# Patient Record
Sex: Male | Born: 1945 | Race: White | Hispanic: No | Marital: Married | Smoking: Former smoker
Health system: Southern US, Community
[De-identification: ages and names within clinical notes are randomized; demographics above are authoritative.]

## PROBLEM LIST (undated history)

## (undated) DIAGNOSIS — I119 Hypertensive heart disease without heart failure: Secondary | ICD-10-CM

## (undated) DIAGNOSIS — I10 Essential (primary) hypertension: Secondary | ICD-10-CM

## (undated) DIAGNOSIS — I2699 Other pulmonary embolism without acute cor pulmonale: Secondary | ICD-10-CM

## (undated) DIAGNOSIS — T8859XA Other complications of anesthesia, initial encounter: Secondary | ICD-10-CM

## (undated) DIAGNOSIS — I4891 Unspecified atrial fibrillation: Secondary | ICD-10-CM

## (undated) DIAGNOSIS — I499 Cardiac arrhythmia, unspecified: Secondary | ICD-10-CM

## (undated) DIAGNOSIS — M419 Scoliosis, unspecified: Secondary | ICD-10-CM

## (undated) DIAGNOSIS — R7303 Prediabetes: Secondary | ICD-10-CM

## (undated) DIAGNOSIS — IMO0002 Reserved for concepts with insufficient information to code with codable children: Secondary | ICD-10-CM

## (undated) DIAGNOSIS — R0609 Other forms of dyspnea: Secondary | ICD-10-CM

## (undated) DIAGNOSIS — I739 Peripheral vascular disease, unspecified: Secondary | ICD-10-CM

## (undated) DIAGNOSIS — E7800 Pure hypercholesterolemia, unspecified: Secondary | ICD-10-CM

## (undated) DIAGNOSIS — K219 Gastro-esophageal reflux disease without esophagitis: Secondary | ICD-10-CM

## (undated) DIAGNOSIS — M1990 Unspecified osteoarthritis, unspecified site: Secondary | ICD-10-CM

## (undated) DIAGNOSIS — R51 Headache: Secondary | ICD-10-CM

## (undated) DIAGNOSIS — G709 Myoneural disorder, unspecified: Secondary | ICD-10-CM

## (undated) DIAGNOSIS — H9319 Tinnitus, unspecified ear: Secondary | ICD-10-CM

## (undated) DIAGNOSIS — I82409 Acute embolism and thrombosis of unspecified deep veins of unspecified lower extremity: Secondary | ICD-10-CM

## (undated) DIAGNOSIS — I2510 Atherosclerotic heart disease of native coronary artery without angina pectoris: Secondary | ICD-10-CM

## (undated) DIAGNOSIS — F419 Anxiety disorder, unspecified: Secondary | ICD-10-CM

## (undated) DIAGNOSIS — G4730 Sleep apnea, unspecified: Secondary | ICD-10-CM

## (undated) DIAGNOSIS — F32A Depression, unspecified: Secondary | ICD-10-CM

## (undated) DIAGNOSIS — D682 Hereditary deficiency of other clotting factors: Secondary | ICD-10-CM

## (undated) DIAGNOSIS — R351 Nocturia: Secondary | ICD-10-CM

## (undated) DIAGNOSIS — R519 Headache, unspecified: Secondary | ICD-10-CM

## (undated) DIAGNOSIS — R0600 Dyspnea, unspecified: Secondary | ICD-10-CM

## (undated) DIAGNOSIS — T4145XA Adverse effect of unspecified anesthetic, initial encounter: Secondary | ICD-10-CM

## (undated) HISTORY — PX: TONSILLECTOMY: SUR1361

## (undated) HISTORY — DX: Hypertensive heart disease without heart failure: I11.9

## (undated) HISTORY — DX: Other forms of dyspnea: R06.09

## (undated) HISTORY — DX: Atherosclerotic heart disease of native coronary artery without angina pectoris: I25.10

## (undated) HISTORY — DX: Dyspnea, unspecified: R06.00

---

## 1972-03-21 HISTORY — PX: APPENDECTOMY: SHX54

## 2003-02-21 ENCOUNTER — Emergency Department (HOSPITAL_COMMUNITY): Admission: AD | Admit: 2003-02-21 | Discharge: 2003-02-21 | Payer: Self-pay | Admitting: Internal Medicine

## 2003-02-24 ENCOUNTER — Encounter (HOSPITAL_COMMUNITY): Admission: RE | Admit: 2003-02-24 | Discharge: 2003-05-25 | Payer: Self-pay | Admitting: Internal Medicine

## 2003-11-25 ENCOUNTER — Observation Stay (HOSPITAL_COMMUNITY): Admission: RE | Admit: 2003-11-25 | Discharge: 2003-11-26 | Payer: Self-pay | Admitting: Orthopedic Surgery

## 2010-03-21 HISTORY — PX: HEMATOMA EVACUATION: SHX5118

## 2010-03-21 HISTORY — PX: CARDIAC CATHETERIZATION: SHX172

## 2010-11-02 ENCOUNTER — Ambulatory Visit (HOSPITAL_COMMUNITY): Payer: Managed Care, Other (non HMO)

## 2010-11-02 ENCOUNTER — Ambulatory Visit (HOSPITAL_COMMUNITY)
Admission: RE | Admit: 2010-11-02 | Discharge: 2010-11-05 | Disposition: A | Discharge status: Home / Self Care | Payer: Managed Care, Other (non HMO) | Source: Ambulatory Visit | Attending: Cardiovascular Disease | Admitting: Cardiovascular Disease

## 2010-11-02 DIAGNOSIS — Z01812 Encounter for preprocedural laboratory examination: Secondary | ICD-10-CM | POA: Insufficient documentation

## 2010-11-02 DIAGNOSIS — R9439 Abnormal result of other cardiovascular function study: Secondary | ICD-10-CM | POA: Insufficient documentation

## 2010-11-02 DIAGNOSIS — I2510 Atherosclerotic heart disease of native coronary artery without angina pectoris: Secondary | ICD-10-CM | POA: Insufficient documentation

## 2010-11-02 DIAGNOSIS — Z01818 Encounter for other preprocedural examination: Secondary | ICD-10-CM | POA: Insufficient documentation

## 2010-11-02 DIAGNOSIS — Z01810 Encounter for preprocedural cardiovascular examination: Secondary | ICD-10-CM | POA: Insufficient documentation

## 2010-11-02 LAB — BASIC METABOLIC PANEL
CO2: 28 mEq/L (ref 19–32)
Creatinine, Ser: 0.86 mg/dL (ref 0.50–1.35)
GFR calc non Af Amer: >60 mL/min (ref >60)
Glucose, Bld: 98 mg/dL (ref 70–99)
Sodium: 141 mEq/L (ref 135–145)

## 2010-11-02 LAB — CBC
MCH: 32.2 pg (ref 26.0–34.0)
MCV: 92.7 fL (ref 78.0–100.0)
Platelets: 238 10*3/uL (ref 150–400)

## 2010-11-02 LAB — PROTIME-INR: Prothrombin Time: 12.4 seconds (ref 11.6–15.2)

## 2010-11-03 LAB — CBC
HCT: 41.8 % (ref 39.0–52.0)
Hemoglobin: 14.4 g/dL (ref 13.0–17.0)
MCHC: 34.4 g/dL (ref 30.0–36.0)
Platelets: 185 10*3/uL (ref 150–400)
RBC: 4.47 MIL/uL (ref 4.22–5.81)
WBC: 5.6 10*3/uL (ref 4.0–10.5)

## 2010-11-03 LAB — BASIC METABOLIC PANEL
BUN: 15 mg/dL (ref 6–23)
Creatinine, Ser: 0.83 mg/dL (ref 0.50–1.35)
GFR calc Af Amer: >60 mL/min (ref >60)
GFR calc non Af Amer: >60 mL/min (ref >60)
Potassium: 3.8 mEq/L (ref 3.5–5.1)
Sodium: 141 mEq/L (ref 135–145)

## 2010-11-03 LAB — CARDIAC PANEL(CRET KIN+CKTOT+MB+TROPI)
CK, MB: 6.4 ng/mL (ref 0.3–4.0)
Relative Index: INVALID (ref 0.0–2.5)
Total CK: 93 U/L (ref 7–232)
Troponin I: 0.55 ng/mL (ref <0.30)

## 2010-11-03 LAB — POCT ACTIVATED CLOTTING TIME: Activated Clotting Time: 325 seconds

## 2010-11-04 LAB — CARDIAC PANEL(CRET KIN+CKTOT+MB+TROPI)
Total CK: 91 U/L (ref 7–232)
Total CK: 76 U/L (ref 7–232)
Troponin I: 0.67 ng/mL (ref <0.30)

## 2010-11-04 LAB — BASIC METABOLIC PANEL
BUN: 10 mg/dL (ref 6–23)
Calcium: 9.2 mg/dL (ref 8.4–10.5)
GFR calc Af Amer: >60 mL/min (ref >60)
Sodium: 140 mEq/L (ref 135–145)

## 2010-11-04 LAB — CK TOTAL AND CKMB (NOT AT ARMC)
CK, MB: 7.2 ng/mL (ref 0.3–4.0)
Total CK: 93 U/L (ref 7–232)

## 2010-11-04 LAB — CBC
HCT: 41.8 % (ref 39.0–52.0)
Hemoglobin: 13.9 g/dL (ref 13.0–17.0)
MCH: 31.2 pg (ref 26.0–34.0)
MCHC: 33.3 g/dL (ref 30.0–36.0)

## 2010-11-05 LAB — CARDIAC PANEL(CRET KIN+CKTOT+MB+TROPI)
CK, MB: 3.8 ng/mL (ref 0.3–4.0)
Troponin I: <0.3 ng/mL (ref <0.30)

## 2010-11-05 NOTE — Cardiovascular Report (Signed)
NAME:  Nathan Rodriguez, Nathan Rodriguez.:  [id number]  MEDICAL RECORD NO.:  [id number]  LOCATION:  6526                         FACILITY:  [facility]  PHYSICIAN:  Nanetta Batty, M.D.   DATE OF BIRTH:  03-17-1946  DATE OF PROCEDURE: DATE OF DISCHARGE:                           CARDIAC CATHETERIZATION   Nathan Rodriguez is a very pleasant 65 year old thin-appearing Caucasian male patient of Dr. Nelva Bush referred for cardiac catheterization because of symptoms and positive Myoview.  The patient has a possibility of risk factors.  DESCRIPTION OF PROCEDURE:  The patient was brought to the second floor [place] cardiac cath lab in the postabsorptive state.  He was premedicated with p.o. Valium.  His right groin was prepped and shaved in the usual sterile fashion.  A 1% Xylocaine was used for local anesthesia.  A 5-French sheath was inserted into the right femoral artery using standard Seldinger technique.  A 5-French right-left Judkins diagnostic catheters as well 5-French pigtail catheter were used for selective coronary cholangiography, left ventriculography, subselective left internal mammary artery angiography and distal abdominal aortography.  Visipaque dye was used for the entirety of the case.  Retrograde aorta, left ventricular and pullback pressures were recorded.  HEMODYNAMIC RESULTS: 1. Aortic systolic pressure 129, diastolic pressure 81. 2. Left ventricle systolic pressure 130, end-diastolic pressure 11.  ANGIOGRAPHIC RESULTS: 1. There was moderate fluoroscopic calcium in the proximal portion and     mid portion of the LAD. 2. Left main; normal. 3. LAD; LAD had 60% apple core lesion at the junction of the proximal     mid third just prior to the takeoff of a moderate-sized diagonal     branch which itself had segmental 50 or 60% stenosis.  Beyond this,     there was a long 50%-60% stenosis, terminating in a focal apple     core 90% stenosis in the mid-LAD.  The entire  segment was     fluoroscopically calcified. 4. Left circumflex; free of significant disease. 5. Right coronary artery; dominant and free of significant disease. 6. Left ventriculography; RAO left ventriculogram was performed using     25 mL of Visipaque dye at 12 mL per second.  The overall LVEF was     estimated at approximately 50-55% without significant focal wall     motion abnormalities. 7. Left internal mammary artery; this vessel was subselectively     visualized and was widely patent.  It was suitable for use during     coronary artery bypass grafting if required. 8. Distal abdominal aortography; distal abdominal aortogram was     performed using 20 mL of Visipaque dye at 20 mL per second x2.  The     renal arteries were widely patent.  Infrarenal abdominal aorta and     iliac bifurcation appeared free of significant atherosclerotic     changes.  IMPRESSION:  Nathan Rodriguez has a high-grade segmental proximal mid LAD disease straddling a moderate-sized diagonal.  I believe the best therapy would be high-speed rotational atherectomy followed by PCI and stenting using drug-eluting stent.  He has no contraindications to this nor does he have contraindication to Effient which I will load him with. I will  discussed this with Dr. Daphene Jaeger who agrees to perform the procedure tomorrow.  Sheath was removed and pressures were held over the groin, achieved hemostasis.  The patient left lab in stable condition.  He already received aspirin, he will receive 60 mg of Effient and will be put on unit 6500 until his procedure tomorrow.  Dr.  Elwin Mocha, his primary cardiologist was notified of these results as well.     Nanetta Batty, M.D.     JB/MEDQ  D:  11/02/2010  T:  11/02/2010  Job:  [id number]  cc:   [facility] Nicki Guadalajara, M.D. Elwin Mocha, MD  Electronically Signed by Nanetta Batty M.D. on 11/05/2010 03:37:58 PM

## 2010-11-05 NOTE — H&P (Signed)
NAME:  Nathan Rodriguez, Nathan Rodriguez NO.:  [id number]  MEDICAL RECORD NO.:  [id number]  LOCATION:  [facility]                         FACILITY:  [facility]  PHYSICIAN:  Nanetta Batty, M.D.   DATE OF BIRTH:  January 16, 1946  DATE OF ADMISSION:  11/02/2010 DATE OF DISCHARGE:                             HISTORY & PHYSICAL   CHIEF COMPLAINT:  Chest pain with radiation to right jaw and positive nuclear stress test.  HISTORY OF PRESENT ILLNESS:  A 65 year old white male who presents to [facility] for elective cardiac catheterization secondary to chest pain that was worked up through [facility].  Chest pain occurred in June of this year.  It was sudden.  It occurred for about approximately 10 minutes in the left chest and substernal area with radiation to the right jaw.  No associated dizziness, diaphoresis, dyspnea, nausea, or palpitations.  It occurred when the patient had first awaken, moderate discomfort, and described as a pressure sensation.  The patient has a right bundle branch block on his EKG.  He underwent a stress Myoview which was abnormal and that the EF was 38%.  He has severe hypokinesis to akinesis of the basal anterior basal, anterior septal, basal inferior septal, basal inferior lateral, anterior and apical myocardial walls. He had mainly inferior akinesis.  This was followed by 2-D echo.  EF was 65% without regional wall motion abnormalities.  He had mild LVH and mild diastolic dysfunction.  RV was moderately enlarged with preserved function.  No obvious RVH.  The aortic root appeared to be upper limits of normal but did expand to 3.9 cm in the ascending aorta.  Aortic valve was mildly sclerotic, no stenosis.  He also had mild MR, mild to trace TR.  Carotid Dopplers did not reveal any high-grade stenosis.  Due to the conflicting inflammation, the patient was scheduled for elective cardiac catheterization today with Dr. Allyson Sabal.  PAST MEDICAL HISTORY:  History of BPH,  vocal cord paralysis, sleep apnea, acid reflux.  Carotid bruit that shows mild stenosis and history of Barrett's esophagus.  FAMILY HISTORY:  Positive for diabetes and heart disease as well as Leiden factor V deficiency.  PAST SURGICAL HISTORY:  Appendectomy, tonsillectomy, prostate surgery.  SOCIAL HISTORY:  No alcohol or tobacco use.  He stopped both remotely.  REVIEW OF SYSTEMS:  GENERAL:  No syncope.  GI:  No complaints of reflux currently, no melena.  GU: No hematuria, dysuria.  CARDIOVASCULAR:  As described.  PULMONARY:  Negative.  MUSCULOSKELETAL:  Stable.  No acute process.  OUTPATIENT MEDICATIONS: 1. Prilosec 20 mg 1 every day as needed. 2. Ibuprofen 200 mg 1 every 8 hours as needed.  ALLERGIES:  No known allergies.  PHYSICAL EXAMINATION:  VITAL SIGNS:  Blood pressure 143/90, pulse 66, respiratory rate 18, temperature 96.8, oxygen saturation 100%. GENERAL:  Alert and oriented white male in no acute distress, pleasant affect. SKIN:  Warm and dry.  Brisk capillary refill. HEENT:  Normocephalic.  Glasses in place. NECK:  Supple.  No JVD, soft carotid bruit. LUNGS:  Clear without rales, rhonchi, or wheezes. HEART:  Sounds S1 and S2, regular rate and rhythm with a 2/6 systolic murmur. ABDOMEN:  Soft, nontender,  positive bowel sounds.  I do not palpate liver, spleen, or masses. EXTREMITIES:  Lower Extremities:  1+ pedal edema.  No lower extremity edema. NEURO:  Alert and oriented x3.  Follows commands.  LABORATORY DATA:  Sodium 139, potassium 4.7, BUN 16, creatinine 1.0, glucose 93, hemoglobin 16, hematocrit 46.4, WBC slightly low at 3.8, and platelets 257,000.  IMPRESSION: 1. Chest pain with abnormal nuclear stress test. 2. Family history of coronary disease. 3. Family history of Leiden factor V deficiency.  PLAN:  Proceed with elective cardiac catheterization.  Questions the patient had were answered concerning the cardiac cath.     Darcella Gasman. Annie Paras,  N.P.   ______________________________ Nanetta Batty, M.D.    LRI/MEDQ  D:  11/02/2010  T:  11/02/2010  Job:  [id number]  cc:   Nanetta Batty, M.D. Dr. Dory Peru  Electronically Signed by Nada Boozer N.P. on 11/03/2010 12:40:19 PM Electronically Signed by Nanetta Batty M.D. on 11/05/2010 03:37:55 PM

## 2010-11-10 NOTE — Discharge Summary (Signed)
NAME:  Nathan Rodriguez, Nathan Rodriguez NO.:  [id number]  MEDICAL RECORD NO.:  [id number]  LOCATION:  6526                         FACILITY:  [facility]  PHYSICIAN:  Nanetta Batty, M.D.   DATE OF BIRTH:  [birth year]  DATE OF ADMISSION:  11/02/2010 DATE OF DISCHARGE:  11/05/2010                              DISCHARGE SUMMARY   DISCHARGE DIAGNOSES: 1. Coronary artery disease status post left heart catheterization,     November 02, 2010, high-grade segmental proximal and mid left     anterior descending disease with normal ejection fraction.  Also     status post left heart catheterization on November 03, 2010, with     successful percutaneous coronary artery intervention and     transluminal coronary angioplasty and tandem stenting with a     Resolute drug-eluting stents to proximal to mid left anterior     descending artery. 2. Abnormal nuclear stress test.  HOSPITAL COURSE:  Nathan Rodriguez is a 65 year old Caucasian male who presented to [facility] for elective cardiac catheterizations secondary to chest pain and was worked up at [facility].  The chest pain had occurred back in June 2012.  EKG revealed right bundle-branch block.  He underwent a stress Myoview which was abnormal and showed an ejection fraction of 38%.  Indicated severe hypokinesis and akinesis of the basal, anterobasal, and anteroseptal, basal inferoseptal, basal inferolateral, anterior and apical myocardial walls.  Cardiac catheterization on November 02, 2010, revealed high-grade segmental proximal mid LAD disease shuttling a moderate-sized diagonal.  He was started on Effient and he underwent high-speed rotational atherectomy on November 03, 2010, followed by tandem stenting with Resolute drug-eluting stents by Dr. Tresa Endo.  This was completed successfully.  Nathan Rodriguez did have troponin which peaked at 0.90 and a peak CK-MB of 7.2.  Subsequent cycling of the cardiac enzymes showed decreasing troponin to less than 0.30 on  the day of discharge.  The patient has been seen by Dr. Allyson Sabal this morning and feels he is stable for discharge home and follow up with Dr. Konrad Felix at [facility].  DISCHARGE LABORATORY DATA:  WBC 6.6, hemoglobin 13.9, hematocrit 41.8, platelets 185.  Sodium 140, potassium 4.7, chloride 107, carbon dioxide 30, glucose 107, BUN 10, creatinine 0.80, calcium 9.2.  Last CK total was 67 and last CK-MB was 3.8 and last troponin was less than 0.30.  STUDIES/PROCEDURES: 1. Chest x-ray on November 02, 2010, showed no acute cardiopulmonary     disease. 2. Cardiac catheterization on November 02, 2010.  Angiographic Results:     There was moderate fluoroscopic calcium in the proximal portion and     midportion of the LAD.  Left main was normal.  The LAD had a 60%     apple core lesion of junctional, proximal, and mid third, just     prior to the take off a moderate-sized diagonal branch which itself     had segmental 50-60% stenosis.  Beyond this, there was a 50-60%     stenosis terminating at a focal apple core 90% stenosis in the mid     LAD.  The entire segment was fluoroscopically calcified.  The left  circumflex was free of significant disease.  The right coronary     artery was dominant, free of significant disease.  Left     ventriculography showed an estimated ejection fraction of 50-55%.     Without focal wall motion abnormalities.     Left internal mammary artery:  This vessel was subselectively visualized     and was widely patent.     Distal abdominal aortography:  Renal arteries were widely patent.     Infrarenal abdominal aorta and iliac bifurcation appeared free of     significant atherosclerotic changes.     Cardiac catheterization on November 03, 2010.  IMPRESSION:  Successful complex percutaneous coronary intervention of calcified proximal to mid left anterior descending diffuse lesions utilizing high-speed rotational atherectomy, percutaneous  transluminal coronary angioplasty, tandem stenting utilizing 3.5 x 26-mm Resolute drug-eluting stents postdilated to 3.75 mm tapering to 3.66 mm, also temporary pacemaker.  DISCHARGE MEDICATIONS: 1. Acetaminophen 325 mg 2 tablets by mouth every 4 hours as needed for     pain. 2. Aspirin 81 mg 1 tablet by mouth daily. 3. Imdur 30 mg 1 tablet by mouth daily. 4. Metoprolol 25 mg 1 tablet by mouth twice daily. 5. Nitroglycerin sublingual 0.4 mg 1 tablet under the tongue every 5     minutes up to three doses for chest pain. 6. Effient 10 mg 1 tablet by mouth daily. 7. Zocor 20 mg 1 tablet by mouth daily. 8. Ibuprofen 200 mg 1 tablet by mouth every 8 hours as needed for     pain. 9. Prilosec 20 mg 1 capsule by mouth every morning as needed for acid     reflux.  DISPOSITION:  Nathan Rodriguez will be discharged home in stable condition. He is recommended to increase his activity slowly.  He may shower and bathe.  No lifting or driving for 2 days.  It is recommended he eats a heart-healthy diet.  If the cath site becomes very painful, swollen, or discharges fluid or pus, he is to call our office.  He will follow up with Dr. Konrad Felix, and he is advised to call his office for an appointment.    ______________________________ Wilburt Finlay, PA   ______________________________ Nanetta Batty, M.D.    BH/MEDQ  D:  11/05/2010  T:  11/05/2010  Job:  [id number]  cc:   Dr. Konrad Felix  Electronically Signed by Wilburt Finlay PA on 11/05/2010 03:49:27 PM Electronically Signed by Nanetta Batty M.D. on 11/10/2010 03:28:00 PM

## 2010-12-01 NOTE — Cardiovascular Report (Signed)
NAME:  Nathan Rodriguez, ERBE NO.:  [id number]  MEDICAL RECORD NO.:  [id number]  LOCATION:  6526                         FACILITY:  [facility]  PHYSICIAN:  Nicki Guadalajara, M.D.     DATE OF BIRTH:  04-01-1945  DATE OF PROCEDURE: DATE OF DISCHARGE:                           CARDIAC CATHETERIZATION   PROCEDURE:  Complex percutaneous coronary intervention:  Temporary pacemaker; high-speed rotational atherectomy; percutaneous transluminal coronary angioplasty; stent insertion x2 proximal to mid left anterior descending artery.  INDICATIONS:  Mr. Kallon Caylor is a 65 year old gentleman who underwent diagnostic cardiac catheterization yesterday, November 02, 2010, by Dr. Allyson Sabal.  This revealed highly calcified proximal to mid-LAD stenoses 60-70% up to 95%.  Due to the extensive calcification, it was felt that this would be best treated with high-speed rotational atherectomy/PCI and the patient is now referred for this.  PROCEDURE:  After premedication with Versed 2 mg plus fentanyl 50 mcg, the patient was prepped and draped in the usual fashion.  His right femoral artery was punctured anteriorly and a 7-French sheath was inserted.  A 6-French sheath was inserted for venous access and a 5- [place] temporary pacemaker was advanced to the RV apex.  Bivalirudin was administered in bolus and infusion.  ACT was documented to be therapeutic.  The patient had been loaded with Effient 60 mg yesterday and received 10 mg of Effient today.  A 7-French Q-curve 3.5 guide was used for the intervention.  A Rotafloppy wire was advanced into the LAD and advanced beyond the lesions distally.  Initial high-speed rotational atherectomy was done with a 1.5 mm bur.  Several runs were made with significant debulking.  This was then dyna-glided L and exchanged for a 1.75 mm bur.  Multiple runs were made with the 1.75 mm bur.  Post high- speed rotational atherectomy low pressure dilatation was done with a  3.0 x 20-mm Sprinter balloon to do low-level inflations at all sites with a Rotablator past.  This balloon was used to help measure the different lesions length.  Some views suggested that again there was slight additional narrowing beyond the previously high-grade stenosis and for this reason, tandem 3.5 x 26 mm stents were inserted to cover all stenotic areas extending from the proximal to this mid-LAD segment.  The stents were dilated up to 11 atmospheres corresponding to 3.6 mm.  A 3.75 x 21-mm noncompliant Sprinter was used for poststent dilatation up to 3.75 mm proximally tapering to 3.66 mm in the mid-LAD segment.  For the stenting and the balloon procedure, intuition wire had been inserted and exchanged for the Rotafloppy wire prior to the stent insertion. [facility] angiography confirmed an excellent angiographic result.  During the procedure, the patient did utilize the temporary pacemaker and had paced rhythm intermittently.  He received numerous doses of intracoronary nitroglycerin.  He left the catheterization laboratory with stable hemodynamics, painfree with an excellent angiographic result.  HEMODYNAMIC DATA:  Central aortic pressure was 125/67.  ANGIOGRAPHIC DATA:  Please refer to Dr. Hazle Coca diagnostic cardiac catheterization report.  The left anterior descending artery was a large vessel which was extensively calcified in proximal to mid segment. There was narrowing up to 70% before the takeoff of  a bifurcating diagonal vessel.  The diagonal vessel did have diffuse disease with narrowing of 50- 60% throughout its proximal to mid segment.  The LAD after the diagonal vessel was calcified with narrowing of 60-70%, followed by high-grade stenosis of 95%.  Beyond further down, there was area of 40% narrowing in the mid segment.  Following multiple runs with 1.75 mm bur, PTCA and ultimate stenting with tandem 3.5 x 26-mm Resolute DES stents postdilated to 3.75 mm tapering to  3.66 mm, the entire LAD segment was reduced to 0%.  There was brisk TIMI 3 flow.  There was no evidence for dissection.  There was brisk TIMI 3 flow in the diagonal vessel which was jailed by the stents without any significant change from baseline abnormalities.  IMPRESSION: 1. Successful complex percutaneous coronary intervention of calcified     proximal to mid left anterior descending diffuse lesions utilizing     high-speed rotational atherectomy, percutaneous transluminal     coronary angioplasty, tandem stenting utilizing 3.5 x 26-mm     Resolute DES stents postdilated to 3.75 mm tapering to 3.66 mm.  2.     Temporary pacemaker. 2. Bivalirudin/Effient/ intracoronary nitroglycerin.          ______________________________ Nicki Guadalajara, M.D.     TK/MEDQ  D:  11/03/2010  T:  11/03/2010  Job:  [id number]  cc:   Nanetta Batty, M.D. Dr. Dory Peru  Electronically Signed by Nicki Guadalajara M.D. on 12/01/2010 12:09:21 PM

## 2012-08-27 ENCOUNTER — Encounter (HOSPITAL_BASED_OUTPATIENT_CLINIC_OR_DEPARTMENT_OTHER): Payer: Self-pay | Admitting: *Deleted

## 2012-08-27 ENCOUNTER — Emergency Department (HOSPITAL_BASED_OUTPATIENT_CLINIC_OR_DEPARTMENT_OTHER)
Admission: EM | Admit: 2012-08-27 | Discharge: 2012-08-27 | Disposition: A | Discharge status: Home / Self Care | Payer: Medicare Other | Attending: Emergency Medicine | Admitting: Emergency Medicine

## 2012-08-27 ENCOUNTER — Emergency Department (HOSPITAL_BASED_OUTPATIENT_CLINIC_OR_DEPARTMENT_OTHER): Payer: Medicare Other

## 2012-08-27 DIAGNOSIS — Z7982 Long term (current) use of aspirin: Secondary | ICD-10-CM | POA: Insufficient documentation

## 2012-08-27 DIAGNOSIS — T148XXA Other injury of unspecified body region, initial encounter: Secondary | ICD-10-CM

## 2012-08-27 DIAGNOSIS — S8990XA Unspecified injury of unspecified lower leg, initial encounter: Secondary | ICD-10-CM | POA: Insufficient documentation

## 2012-08-27 DIAGNOSIS — Y92410 Unspecified street and highway as the place of occurrence of the external cause: Secondary | ICD-10-CM | POA: Insufficient documentation

## 2012-08-27 DIAGNOSIS — S4980XA Other specified injuries of shoulder and upper arm, unspecified arm, initial encounter: Secondary | ICD-10-CM | POA: Insufficient documentation

## 2012-08-27 DIAGNOSIS — Z79899 Other long term (current) drug therapy: Secondary | ICD-10-CM | POA: Insufficient documentation

## 2012-08-27 DIAGNOSIS — S46909A Unspecified injury of unspecified muscle, fascia and tendon at shoulder and upper arm level, unspecified arm, initial encounter: Secondary | ICD-10-CM | POA: Insufficient documentation

## 2012-08-27 DIAGNOSIS — T07XXXA Unspecified multiple injuries, initial encounter: Secondary | ICD-10-CM | POA: Insufficient documentation

## 2012-08-27 DIAGNOSIS — Y9389 Activity, other specified: Secondary | ICD-10-CM | POA: Insufficient documentation

## 2012-08-27 DIAGNOSIS — E7800 Pure hypercholesterolemia, unspecified: Secondary | ICD-10-CM | POA: Insufficient documentation

## 2012-08-27 DIAGNOSIS — Z8719 Personal history of other diseases of the digestive system: Secondary | ICD-10-CM | POA: Insufficient documentation

## 2012-08-27 HISTORY — DX: Pure hypercholesterolemia, unspecified: E78.00

## 2012-08-27 HISTORY — DX: Gastro-esophageal reflux disease without esophagitis: K21.9

## 2012-08-27 MED ORDER — ACETAMINOPHEN 325 MG PO TABS: 650.0000 mg | ORAL_TABLET | Freq: Once | ORAL | Status: DC
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
## 2012-10-31 ENCOUNTER — Telehealth: Payer: Self-pay | Admitting: *Deleted

## 2012-10-31 NOTE — Telephone Encounter (Signed)
Nathan Nathan Rodriguez reviewed the chart.  Nathan Rodriguez has not been seen since 2012.  He will need an office visit with Nathan Nathan Rodriguez before clearance can be given.   I spoke with patient's wife about the need for an appointment.  She said that they have been following up Nathan Rodriguez and Nathan Rodriguez cleared him for the procedure.  I verbalized understanding.   They will contact us to make an appointment  if [place] [facility] wants [place] to sign off as well.

## 2012-10-31 NOTE — Progress Notes (Signed)
Need orders in EPIC.  Surgery scheduled for 11/13/12.  Thank You.

## 2012-11-01 ENCOUNTER — Encounter (HOSPITAL_COMMUNITY): Payer: Self-pay | Admitting: Pharmacy Technician
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
## 2012-11-05 ENCOUNTER — Encounter (HOSPITAL_COMMUNITY)
Admission: RE | Admit: 2012-11-05 | Discharge: 2012-11-05 | Disposition: A | Discharge status: Home / Self Care | Payer: Medicare Other | Source: Ambulatory Visit | Attending: Orthopedic Surgery | Admitting: Orthopedic Surgery

## 2012-11-05 ENCOUNTER — Ambulatory Visit (HOSPITAL_COMMUNITY)
Admission: RE | Admit: 2012-11-05 | Discharge: 2012-11-05 | Disposition: A | Discharge status: Home / Self Care | Payer: Medicare Other | Source: Ambulatory Visit | Attending: Surgical | Admitting: Surgical

## 2012-11-05 ENCOUNTER — Encounter (HOSPITAL_COMMUNITY): Payer: Self-pay

## 2012-11-05 DIAGNOSIS — Z01818 Encounter for other preprocedural examination: Secondary | ICD-10-CM | POA: Insufficient documentation

## 2012-11-05 DIAGNOSIS — Z01812 Encounter for preprocedural laboratory examination: Secondary | ICD-10-CM | POA: Insufficient documentation

## 2012-11-05 HISTORY — DX: Unspecified osteoarthritis, unspecified site: M19.90

## 2012-11-05 HISTORY — DX: Adverse effect of unspecified anesthetic, initial encounter: T41.45XA

## 2012-11-05 HISTORY — DX: Tinnitus, unspecified ear: H93.19

## 2012-11-05 HISTORY — DX: Essential (primary) hypertension: I10

## 2012-11-05 HISTORY — DX: Atherosclerotic heart disease of native coronary artery without angina pectoris: I25.10

## 2012-11-05 HISTORY — DX: Headache: R51

## 2012-11-05 HISTORY — DX: Other complications of anesthesia, initial encounter: T88.59XA

## 2012-11-05 HISTORY — DX: Reserved for concepts with insufficient information to code with codable children: IMO0002

## 2012-11-05 HISTORY — DX: Sleep apnea, unspecified: G47.30

## 2012-11-05 HISTORY — DX: Scoliosis, unspecified: M41.9

## 2012-11-05 HISTORY — DX: Nocturia: R35.1

## 2012-11-05 HISTORY — DX: Headache, unspecified: R51.9

## 2012-11-05 LAB — URINALYSIS, ROUTINE W REFLEX MICROSCOPIC
Bilirubin Urine: NEGATIVE
Glucose, UA: NEGATIVE mg/dL
Ketones, ur: NEGATIVE mg/dL
Leukocytes, UA: NEGATIVE
Nitrite: NEGATIVE
Protein, ur: NEGATIVE mg/dL
Specific Gravity, Urine: 1.024 (ref 1.005–1.030)
Urobilinogen, UA: 0.2 mg/dL (ref 0.0–1.0)
pH: 5 (ref 5.0–8.0)

## 2012-11-05 LAB — COMPREHENSIVE METABOLIC PANEL
ALT: 18 U/L (ref 0–53)
AST: 23 U/L (ref 0–37)
Albumin: 3.9 g/dL (ref 3.5–5.2)
Alkaline Phosphatase: 73 U/L (ref 39–117)
BUN: 23 mg/dL (ref 6–23)
CO2: 30 mEq/L (ref 19–32)
Calcium: 10.5 mg/dL (ref 8.4–10.5)
Chloride: 99 mEq/L (ref 96–112)
Creatinine, Ser: 0.84 mg/dL (ref 0.50–1.35)
GFR calc Af Amer: >90 mL/min (ref >90)
GFR calc non Af Amer: 88 mL/min — ABNORMAL LOW (ref >90)
Glucose, Bld: 82 mg/dL (ref 70–99)
Potassium: 4.1 mEq/L (ref 3.5–5.1)
Sodium: 138 mEq/L (ref 135–145)
Total Bilirubin: 0.4 mg/dL (ref 0.3–1.2)
Total Protein: 7.3 g/dL (ref 6.0–8.3)

## 2012-11-05 LAB — CBC
Hemoglobin: 17.2 g/dL — ABNORMAL HIGH (ref 13.0–17.0)
MCV: 95 fL (ref 78.0–100.0)
Platelets: 251 10*3/uL (ref 150–400)
RBC: 5.38 MIL/uL (ref 4.22–5.81)
WBC: 6.9 10*3/uL (ref 4.0–10.5)

## 2012-11-05 LAB — URINE MICROSCOPIC-ADD ON: Urine-Other: NONE SEEN

## 2012-11-05 LAB — PROTIME-INR
INR: 0.95 (ref 0.00–1.49)
Prothrombin Time: 12.5 seconds (ref 11.6–15.2)

## 2012-11-05 LAB — APTT: aPTT: 27 seconds (ref 24–37)

## 2012-11-05 NOTE — Patient Instructions (Addendum)
20 CANDELARIO STEPPE  11/05/2012   Your procedure is scheduled on: 11/13/12  Report to [facility] at 11:30AM.  Call this number if you have problems the morning of surgery [phone]: [phone]   Remember:   Do not eat food After Midnight, clear liquids from midnight until 8:00am on 11/13/12 then nothing.      Take these medicines the morning of surgery with A SIP OF WATER: prilosec   Do not wear jewelry, make-up or nail polish.  Do not wear lotions, powders, or perfumes. You may wear deodorant.  Do not shave 48 hours prior to surgery. Men may shave face and neck.  Do not bring valuables to the hospital.  Contacts, dentures or bridgework may not be worn into surgery.  Leave suitcase in the car. After surgery it may be brought to your room.  For patients admitted to the hospital, checkout time is 11:00 AM the day of discharge.    Please read over the following fact sheets that you were given: incentive spirometry fact sheet Birdie Sons, RN  pre op nurse call if needed [phone]    FAILURE TO FOLLOW THESE INSTRUCTIONS MAY RESULT IN CANCELLATION OF YOUR SURGERY   Patient Signature: ___________________________________________

## 2012-11-05 NOTE — Progress Notes (Addendum)
Surgery clearance note 10/30/12 Dr. Hanley Hays on chart, EKG 10/30/12 on chart

## 2012-11-13 ENCOUNTER — Encounter (HOSPITAL_COMMUNITY): Payer: Self-pay | Admitting: Anesthesiology

## 2012-11-13 ENCOUNTER — Encounter (HOSPITAL_COMMUNITY): Payer: Self-pay | Admitting: *Deleted

## 2012-11-13 ENCOUNTER — Encounter (HOSPITAL_COMMUNITY)
Admission: RE | Disposition: A | Discharge status: Home / Self Care | Payer: Self-pay | Source: Ambulatory Visit | Attending: Orthopedic Surgery

## 2012-11-13 ENCOUNTER — Ambulatory Visit (HOSPITAL_COMMUNITY): Payer: Medicare Other | Admitting: Anesthesiology

## 2012-11-13 ENCOUNTER — Observation Stay (HOSPITAL_COMMUNITY)
Admission: RE | Admit: 2012-11-13 | Discharge: 2012-11-14 | Disposition: A | Discharge status: Home / Self Care | Payer: Medicare Other | Source: Ambulatory Visit | Attending: Orthopedic Surgery | Admitting: Orthopedic Surgery

## 2012-11-13 DIAGNOSIS — I2510 Atherosclerotic heart disease of native coronary artery without angina pectoris: Secondary | ICD-10-CM | POA: Insufficient documentation

## 2012-11-13 DIAGNOSIS — I10 Essential (primary) hypertension: Secondary | ICD-10-CM | POA: Insufficient documentation

## 2012-11-13 DIAGNOSIS — M75122 Complete rotator cuff tear or rupture of left shoulder, not specified as traumatic: Secondary | ICD-10-CM

## 2012-11-13 DIAGNOSIS — E7800 Pure hypercholesterolemia, unspecified: Secondary | ICD-10-CM | POA: Insufficient documentation

## 2012-11-13 DIAGNOSIS — K219 Gastro-esophageal reflux disease without esophagitis: Secondary | ICD-10-CM | POA: Insufficient documentation

## 2012-11-13 DIAGNOSIS — Y92410 Unspecified street and highway as the place of occurrence of the external cause: Secondary | ICD-10-CM | POA: Insufficient documentation

## 2012-11-13 DIAGNOSIS — Z79899 Other long term (current) drug therapy: Secondary | ICD-10-CM | POA: Insufficient documentation

## 2012-11-13 DIAGNOSIS — G4730 Sleep apnea, unspecified: Secondary | ICD-10-CM | POA: Insufficient documentation

## 2012-11-13 DIAGNOSIS — S43429A Sprain of unspecified rotator cuff capsule, initial encounter: Principal | ICD-10-CM | POA: Insufficient documentation

## 2012-11-13 DIAGNOSIS — Z9889 Other specified postprocedural states: Secondary | ICD-10-CM

## 2012-11-13 HISTORY — PX: SHOULDER OPEN ROTATOR CUFF REPAIR: SHX2407

## 2012-11-13 SURGERY — REPAIR, ROTATOR CUFF, OPEN
Anesthesia: General | Site: Shoulder | Laterality: Left | Wound class: Clean

## 2012-11-13 MED ORDER — OXYCODONE-ACETAMINOPHEN 5-325 MG PO TABS
1.0000 | ORAL_TABLET | ORAL | Status: DC | PRN
  Administered 2012-11-13 – 2012-11-14 (×3): 2 via ORAL
  Filled 2012-11-13 (×3): qty 2

## 2012-11-13 MED ORDER — FLEET ENEMA 7-19 GM/118ML RE ENEM: 1.0000 | ENEMA | Freq: Once | RECTAL | Status: AC | PRN

## 2012-11-13 MED ORDER — DEXAMETHASONE SODIUM PHOSPHATE 10 MG/ML IJ SOLN
INTRAMUSCULAR | Status: DC | PRN
  Administered 2012-11-13: 10 mg via INTRAVENOUS

## 2012-11-13 MED ORDER — BACITRACIN-NEOMYCIN-POLYMYXIN 400-5-5000 EX OINT
TOPICAL_OINTMENT | CUTANEOUS | Status: AC
  Filled 2012-11-13: qty 1

## 2012-11-13 MED ORDER — LACTATED RINGERS IV SOLN: INTRAVENOUS | Status: DC

## 2012-11-13 MED ORDER — POLYETHYLENE GLYCOL 3350 17 G PO PACK: 17.0000 g | PACK | Freq: Every day | ORAL | Status: DC | PRN

## 2012-11-13 MED ORDER — MIDAZOLAM HCL 5 MG/5ML IJ SOLN
INTRAMUSCULAR | Status: DC | PRN
  Administered 2012-11-13: 2 mg via INTRAVENOUS

## 2012-11-13 MED ORDER — GLYCOPYRROLATE 0.2 MG/ML IJ SOLN
INTRAMUSCULAR | Status: DC | PRN
  Administered 2012-11-13: .8 mg via INTRAVENOUS

## 2012-11-13 MED ORDER — ROCURONIUM BROMIDE 100 MG/10ML IV SOLN
INTRAVENOUS | Status: DC | PRN
  Administered 2012-11-13: 15 mg via INTRAVENOUS

## 2012-11-13 MED ORDER — FENTANYL CITRATE 0.05 MG/ML IJ SOLN
INTRAMUSCULAR | Status: DC | PRN
  Administered 2012-11-13: 100 ug via INTRAVENOUS
  Administered 2012-11-13 (×2): 50 ug via INTRAVENOUS

## 2012-11-13 MED ORDER — HYDROMORPHONE HCL PF 1 MG/ML IJ SOLN: 0.2500 mg | INTRAMUSCULAR | Status: DC | PRN

## 2012-11-13 MED ORDER — HYDROMORPHONE HCL PF 1 MG/ML IJ SOLN
0.5000 mg | INTRAMUSCULAR | Status: DC | PRN
  Administered 2012-11-13: 1 mg via INTRAVENOUS
  Filled 2012-11-13: qty 1

## 2012-11-13 MED ORDER — SODIUM CHLORIDE 0.9 % IR SOLN
Status: DC | PRN
  Administered 2012-11-13: 16:00:00

## 2012-11-13 MED ORDER — CEFAZOLIN SODIUM-DEXTROSE 2-3 GM-% IV SOLR
2.0000 g | INTRAVENOUS | Status: AC
  Administered 2012-11-13: 2 g via INTRAVENOUS

## 2012-11-13 MED ORDER — THROMBIN 5000 UNITS EX SOLR
CUTANEOUS | Status: AC
  Filled 2012-11-13: qty 5000

## 2012-11-13 MED ORDER — NEOSTIGMINE METHYLSULFATE 1 MG/ML IJ SOLN
INTRAMUSCULAR | Status: DC | PRN
  Administered 2012-11-13: 4 mg via INTRAVENOUS

## 2012-11-13 MED ORDER — BACITRACIN-NEOMYCIN-POLYMYXIN OINTMENT TUBE
TOPICAL_OINTMENT | CUTANEOUS | Status: DC | PRN
  Administered 2012-11-13: 1 via TOPICAL

## 2012-11-13 MED ORDER — HYDROCODONE-ACETAMINOPHEN 5-325 MG PO TABS: 1.0000 | ORAL_TABLET | ORAL | Status: DC | PRN

## 2012-11-13 MED ORDER — METHOCARBAMOL 500 MG PO TABS
500.0000 mg | ORAL_TABLET | Freq: Four times a day (QID) | ORAL | Status: DC | PRN
  Administered 2012-11-14 (×2): 500 mg via ORAL
  Filled 2012-11-13 (×3): qty 1

## 2012-11-13 MED ORDER — CEFAZOLIN SODIUM 1-5 GM-% IV SOLN
1.0000 g | Freq: Four times a day (QID) | INTRAVENOUS | Status: AC
  Administered 2012-11-13 – 2012-11-14 (×3): 1 g via INTRAVENOUS
  Filled 2012-11-13 (×3): qty 50

## 2012-11-13 MED ORDER — LACTATED RINGERS IV SOLN
INTRAVENOUS | Status: DC
  Administered 2012-11-13: 16:00:00 via INTRAVENOUS
  Administered 2012-11-13: 1000 mL via INTRAVENOUS

## 2012-11-13 MED ORDER — ACETAMINOPHEN 325 MG PO TABS: 650.0000 mg | ORAL_TABLET | Freq: Four times a day (QID) | ORAL | Status: DC | PRN

## 2012-11-13 MED ORDER — 0.9 % SODIUM CHLORIDE (POUR BTL) OPTIME
TOPICAL | Status: DC | PRN
  Administered 2012-11-13: 1000 mL

## 2012-11-13 MED ORDER — PROPOFOL 10 MG/ML IV BOLUS
INTRAVENOUS | Status: DC | PRN
  Administered 2012-11-13: 140 mg via INTRAVENOUS

## 2012-11-13 MED ORDER — BISACODYL 10 MG RE SUPP: 10.0000 mg | Freq: Every day | RECTAL | Status: DC | PRN

## 2012-11-13 MED ORDER — BUPIVACAINE LIPOSOME 1.3 % IJ SUSP
20.0000 mL | Freq: Once | INTRAMUSCULAR | Status: DC
  Filled 2012-11-13: qty 20

## 2012-11-13 MED ORDER — SUCCINYLCHOLINE CHLORIDE 20 MG/ML IJ SOLN
INTRAMUSCULAR | Status: DC | PRN
  Administered 2012-11-13: 100 mg via INTRAVENOUS

## 2012-11-13 MED ORDER — PHENOL 1.4 % MT LIQD: 1.0000 | OROMUCOSAL | Status: DC | PRN

## 2012-11-13 MED ORDER — ONDANSETRON HCL 4 MG/2ML IJ SOLN: 4.0000 mg | Freq: Four times a day (QID) | INTRAMUSCULAR | Status: DC | PRN

## 2012-11-13 MED ORDER — LACTATED RINGERS IV SOLN
INTRAVENOUS | Status: DC
  Administered 2012-11-13: 19:00:00 via INTRAVENOUS

## 2012-11-13 MED ORDER — EPHEDRINE SULFATE 50 MG/ML IJ SOLN
INTRAMUSCULAR | Status: DC | PRN
  Administered 2012-11-13: 15 mg via INTRAVENOUS

## 2012-11-13 MED ORDER — BUPIVACAINE LIPOSOME 1.3 % IJ SUSP
INTRAMUSCULAR | Status: DC | PRN
  Administered 2012-11-13: 10 mL

## 2012-11-13 MED ORDER — MENTHOL 3 MG MT LOZG: 1.0000 | LOZENGE | OROMUCOSAL | Status: DC | PRN

## 2012-11-13 MED ORDER — CEFAZOLIN SODIUM-DEXTROSE 2-3 GM-% IV SOLR
INTRAVENOUS | Status: AC
  Filled 2012-11-13: qty 50

## 2012-11-13 MED ORDER — PANTOPRAZOLE SODIUM 40 MG PO TBEC
40.0000 mg | DELAYED_RELEASE_TABLET | Freq: Every day | ORAL | Status: DC
  Administered 2012-11-14: 40 mg via ORAL
  Filled 2012-11-13: qty 1

## 2012-11-13 MED ORDER — DEXTROSE 5 % IV SOLN
500.0000 mg | Freq: Four times a day (QID) | INTRAVENOUS | Status: DC | PRN
  Administered 2012-11-13: 500 mg via INTRAVENOUS
  Filled 2012-11-13: qty 5

## 2012-11-13 MED ORDER — ONDANSETRON HCL 4 MG/2ML IJ SOLN
INTRAMUSCULAR | Status: DC | PRN
  Administered 2012-11-13: 4 mg via INTRAVENOUS

## 2012-11-13 MED ORDER — SIMVASTATIN 20 MG PO TABS
20.0000 mg | ORAL_TABLET | Freq: Every evening | ORAL | Status: DC
  Administered 2012-11-13: 20 mg via ORAL
  Filled 2012-11-13 (×2): qty 1

## 2012-11-13 MED ORDER — ONDANSETRON HCL 4 MG PO TABS: 4.0000 mg | ORAL_TABLET | Freq: Four times a day (QID) | ORAL | Status: DC | PRN

## 2012-11-13 MED ORDER — BUPIVACAINE-EPINEPHRINE (PF) 0.5% -1:200000 IJ SOLN
INTRAMUSCULAR | Status: AC
  Filled 2012-11-13: qty 10

## 2012-11-13 MED ORDER — ACETAMINOPHEN 650 MG RE SUPP: 650.0000 mg | Freq: Four times a day (QID) | RECTAL | Status: DC | PRN

## 2012-11-13 MED ORDER — ASPIRIN EC 81 MG PO TBEC
81.0000 mg | DELAYED_RELEASE_TABLET | Freq: Every day | ORAL | Status: DC
  Administered 2012-11-13: 81 mg via ORAL
  Filled 2012-11-13 (×2): qty 1

## 2012-11-13 SURGICAL SUPPLY — 49 items
ANCHOR PEEK ZIP 5.5 NDL NO2 (Orthopedic Implant) ×6 IMPLANT
BAG ZIPLOCK 12X15 (MISCELLANEOUS) ×2 IMPLANT
BLADE OSCILLATING/SAGITTAL (BLADE) ×1
BLADE SW THK.38XMED LNG THN (BLADE) ×1 IMPLANT
BNDG COHESIVE 6X5 TAN NS LF (GAUZE/BANDAGES/DRESSINGS) ×2 IMPLANT
BUR OVAL CARBIDE 4.0 (BURR) ×2 IMPLANT
CLEANER TIP ELECTROSURG 2X2 (MISCELLANEOUS) ×2 IMPLANT
CLOTH BEACON ORANGE TIMEOUT ST (SAFETY) ×2 IMPLANT
DERMABOND ADVANCED (GAUZE/BANDAGES/DRESSINGS)
DERMABOND ADVANCED .7 DNX12 (GAUZE/BANDAGES/DRESSINGS) IMPLANT
DRAPE POUCH INSTRU U-SHP 10X18 (DRAPES) ×2 IMPLANT
DRSG AQUACEL AG ADV 3.5X 6 (GAUZE/BANDAGES/DRESSINGS) IMPLANT
DRSG EMULSION OIL 3X3 NADH (GAUZE/BANDAGES/DRESSINGS) ×2 IMPLANT
DRSG PAD ABDOMINAL 8X10 ST (GAUZE/BANDAGES/DRESSINGS) ×8 IMPLANT
DURAPREP 26ML APPLICATOR (WOUND CARE) ×2 IMPLANT
ELECT REM PT RETURN 9FT ADLT (ELECTROSURGICAL) ×2
ELECTRODE REM PT RTRN 9FT ADLT (ELECTROSURGICAL) ×1 IMPLANT
GLOVE BIOGEL PI IND STRL 6.5 (GLOVE) ×1 IMPLANT
GLOVE BIOGEL PI IND STRL 8 (GLOVE) ×2 IMPLANT
GLOVE BIOGEL PI INDICATOR 6.5 (GLOVE) ×1
GLOVE BIOGEL PI INDICATOR 8 (GLOVE) ×2
GLOVE ECLIPSE 8.0 STRL XLNG CF (GLOVE) ×4 IMPLANT
GLOVE SURG SS PI 6.5 STRL IVOR (GLOVE) ×2 IMPLANT
GOWN STRL REIN XL XLG (GOWN DISPOSABLE) ×6 IMPLANT
KIT BASIN OR (CUSTOM PROCEDURE TRAY) ×2 IMPLANT
MANIFOLD NEPTUNE II (INSTRUMENTS) ×2 IMPLANT
NEEDLE MA TROC 1/2 (NEEDLE) IMPLANT
NS IRRIG 1000ML POUR BTL (IV SOLUTION) IMPLANT
PACK SHOULDER CUSTOM OPM052 (CUSTOM PROCEDURE TRAY) ×2 IMPLANT
PASSER SUT SWANSON 36MM LOOP (INSTRUMENTS) IMPLANT
PATCH TISSUE MEND 3X3CM (Orthopedic Implant) ×2 IMPLANT
POSITIONER SURGICAL ARM (MISCELLANEOUS) ×2 IMPLANT
SLING ARM IMMOBILIZER LRG (SOFTGOODS) ×4 IMPLANT
SPONGE GAUZE 4X4 12PLY (GAUZE/BANDAGES/DRESSINGS) ×2 IMPLANT
SPONGE SURGIFOAM ABS GEL 100 (HEMOSTASIS) IMPLANT
STAPLER VISISTAT 35W (STAPLE) IMPLANT
STRIP CLOSURE SKIN 1/2X4 (GAUZE/BANDAGES/DRESSINGS) IMPLANT
SUCTION FRAZIER 12FR DISP (SUCTIONS) ×2 IMPLANT
SUT BONE WAX W31G (SUTURE) ×2 IMPLANT
SUT ETHIBOND NAB CT1 #1 30IN (SUTURE) ×4 IMPLANT
SUT MNCRL AB 4-0 PS2 18 (SUTURE) IMPLANT
SUT VIC AB 0 CT1 27 (SUTURE) ×1
SUT VIC AB 0 CT1 27XBRD ANTBC (SUTURE) ×1 IMPLANT
SUT VIC AB 1 CT1 27 (SUTURE) ×2
SUT VIC AB 1 CT1 27XBRD ANTBC (SUTURE) ×2 IMPLANT
SUT VIC AB 2-0 CT1 27 (SUTURE) ×2
SUT VIC AB 2-0 CT1 27XBRD (SUTURE) ×2 IMPLANT
TOWEL OR 17X26 10 PK STRL BLUE (TOWEL DISPOSABLE) ×2 IMPLANT
TOWEL OR NON WOVEN STRL DISP B (DISPOSABLE) ×2 IMPLANT

## 2012-11-13 NOTE — Progress Notes (Signed)
Patient states his brother had a problem with Lyden factor five with surgery but the patient has never had a problem.  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

## 2012-11-13 NOTE — Progress Notes (Signed)
Dr. Leta Jungling was informed that this patient drank clear liquids until 1030am today.

## 2012-11-13 NOTE — Interval H&P Note (Signed)
History and Physical Interval Note:  11/13/2012 2:11 PM  Nathan Rodriguez  has presented today for surgery, with the diagnosis of LEFT SHOULDER ROTATOR CUFF TEAR   The various methods of treatment have been discussed with the patient and family. After consideration of risks, benefits and other options for treatment, the patient has consented to  Procedure(s): LEFT SHOULDER ROTATOR CUFF REPAIR WITH POSSIBLE GRAFT AND ANCHORS  (Left) as a surgical intervention .  The patient's history has been reviewed, patient examined, no change in status, stable for surgery.  I have reviewed the patient's chart and labs.  Questions were answered to the patient's satisfaction.     Alynn Ellithorpe A

## 2012-11-13 NOTE — Brief Op Note (Signed)
11/13/2012  4:20 PM  PATIENT:  Nathan Rodriguez  67 y.o. male  PRE-OPERATIVE DIAGNOSIS:  LEFT SHOULDER ROTATOR CUFF TEAR   POST-OPERATIVE DIAGNOSIS:  LEFT SHOULDER ROTATOR CUFF TEAR ,Complex,Retracted  PROCEDURE:  Procedure(s): LEFT SHOULDER ROTATOR CUFF REPAIR WITH GRAFT AND ANCHORS  (Left).Complex Repair with Open Acromionectomy  SURGEON:  Surgeon(s) and Role:    * Jacki Cones, MD - Primary    * Drucilla Schmidt, MD - Assisting   :   ASSISTANTS:James Aplington MD   ANESTHESIA:   general  EBL:  Total I/O In: 1000 [I.V.:1000] Out: -   BLOOD ADMINISTERED:none  DRAINS: none   LOCAL MEDICATIONS USED:  BUPIVICAINE10cc   SPECIMEN:  No Specimen  DISPOSITION OF SPECIMEN:  N/A  COUNTS:  YES  TOURNIQUET:  * No tourniquets in log *  DICTATION: .Other Dictation: Dictation Number [id number]  PLAN OF CARE: Admit for overnight observation  PATIENT DISPOSITION:  PACU - hemodynamically stable.   Delay start of Pharmacological VTE agent (>24hrs) due to surgical blood loss or risk of bleeding: yes

## 2012-11-13 NOTE — Progress Notes (Signed)
Dr. 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 PAS hose ordered.

## 2012-11-13 NOTE — Transfer of Care (Signed)
Immediate Anesthesia Transfer of Care Note  Patient: Nathan Rodriguez  Procedure(s) Performed: Procedure(s): LEFT SHOULDER ROTATOR CUFF REPAIR WITH GRAFT AND ANCHORS  (Left)  Patient Location: PACU  Anesthesia Type:General  Level of Consciousness: awake, sedated and patient cooperative  Airway & Oxygen Therapy: Patient Spontanous Breathing and Patient connected to face mask oxygen  Post-op Assessment: Report given to PACU RN and Post -op Vital signs reviewed and stable  Post vital signs: Reviewed and stable  Complications: No apparent anesthesia complications

## 2012-11-13 NOTE — Anesthesia Preprocedure Evaluation (Addendum)
Anesthesia Evaluation  Patient identified by MRN, date of birth, ID band Patient awake    Reviewed: Allergy & Precautions, H&P , NPO status , Patient's Chart, lab work & pertinent test results  History of Anesthesia Complications (+) PROLONGED EMERGENCE  Airway Mallampati: II TM Distance: >3 FB Neck ROM: full    Dental no notable dental hx. (+) Teeth Intact and Dental Advisory Given   Pulmonary sleep apnea and Continuous Positive Airway Pressure Ventilation ,  breath sounds clear to auscultation  Pulmonary exam normal       Cardiovascular hypertension, + CAD and + Cardiac Stents Rhythm:regular Rate:Normal  Stents x 2  2012.  OK since   Neuro/Psych negative neurological ROS  negative psych ROS   GI/Hepatic negative GI ROS, Neg liver ROS, GERD-  Medicated and Controlled,  Endo/Other  negative endocrine ROS  Renal/GU negative Renal ROS  negative genitourinary   Musculoskeletal   Abdominal   Peds  Hematology negative hematology ROS (+)   Anesthesia Other Findings   Reproductive/Obstetrics negative OB ROS                          Anesthesia Physical Anesthesia Plan  ASA: III  Anesthesia Plan: General   Post-op Pain Management:    Induction: Intravenous  Airway Management Planned: Oral ETT  Additional Equipment:   Intra-op Plan:   Post-operative Plan: Extubation in OR  Informed Consent: I have reviewed the patients History and Physical, chart, labs and discussed the procedure including the risks, benefits and alternatives for the proposed anesthesia with the patient or authorized representative who has indicated his/her understanding and acceptance.   Dental Advisory Given  Plan Discussed with: CRNA and Surgeon  Anesthesia Plan Comments:         Anesthesia Quick Evaluation

## 2012-11-13 NOTE — Anesthesia Postprocedure Evaluation (Signed)
  Anesthesia Post-op Note  Patient: Nathan Rodriguez  Procedure(s) Performed: Procedure(s) (LRB): LEFT SHOULDER ROTATOR CUFF REPAIR WITH GRAFT AND ANCHORS  (Left)  Patient Location: PACU  Anesthesia Type: General  Level of Consciousness: awake and alert   Airway and Oxygen Therapy: Patient Spontanous Breathing  Post-op Pain: mild  Post-op Assessment: Post-op Vital signs reviewed, Patient's Cardiovascular Status Stable, Respiratory Function Stable, Patent Airway and No signs of Nausea or vomiting  Last Vitals:  Filed Vitals:   11/13/12 1913  BP: 152/79  Pulse: 51  Temp: 36.5 C  Resp: 15    Post-op Vital Signs: stable   Complications: No apparent anesthesia complications

## 2012-11-14 ENCOUNTER — Encounter (HOSPITAL_COMMUNITY): Payer: Self-pay | Admitting: Orthopedic Surgery

## 2012-11-14 MED ORDER — METHOCARBAMOL 500 MG PO TABS: 500.0000 mg | ORAL_TABLET | Freq: Four times a day (QID) | ORAL | Status: DC | PRN

## 2012-11-14 MED ORDER — OXYCODONE-ACETAMINOPHEN 5-325 MG PO TABS: 1.0000 | ORAL_TABLET | ORAL | Status: DC | PRN

## 2012-11-14 NOTE — Discharge Summary (Signed)
Physician Discharge Summary  Patient ID: Nathan Rodriguez MRN: [id number] DOB/AGE: [birth year] 67 y.o.  Admit date: 11/13/2012 Discharge date: 11/14/2012  Admission Diagnoses:Complete Tear of  Rotator Cuff  Discharge Diagnoses: Complete Tear of Rotator Cuff Active Problems:   Complete tear of left rotator cuff   Discharged Condition: good  Hospital Course: No Post-Op complications  Consults: rehabilitation medicine  Significant Diagnostic Studies: None  Treatments: OT Evaluation  Discharge Exam: Blood pressure 113/72, pulse 71, temperature 98.4 F (36.9 C), temperature source Oral, resp. rate 18, height 5\' 11"  (1.803 m), weight 75.751 kg (167 lb), SpO2 96.00%. Extremities: Circulation in hand intact  Disposition: 01-Home or Self Care  Discharge Orders   Future Orders Complete By Expires   Call MD / Call 911  As directed    Comments:     If you experience chest pain or shortness of breath, CALL 911 and be transported to the hospital emergency room.  If you develope a fever above 101 F, pus (white drainage) or increased drainage or redness at the wound, or calf pain, call your surgeon's office.   Constipation Prevention  As directed    Comments:     Drink plenty of fluids.  Prune juice may be helpful.  You may use a stool softener, such as Colace (over the counter) 100 mg twice a day.  Use MiraLax (over the counter) for constipation as needed.   Diet - low sodium heart healthy  As directed    Discharge instructions  As directed    Comments:     Pick up stool softner and laxative for home. Do not submerge incision under water. May start showering on Friday.  Cover the dressing and incision with Seran Wrap while in shower Continue to use ice for pain and swelling from surgery. May start changing dressing on Friday.  Sling At All Times. No motion to the shoulder. May come out of the sling several times a day for motion of the elbow and hand only.   Do not sit on low chairs,  stoools or toilet seats, as it may be difficult to get up from low surfaces  As directed    Driving restrictions  As directed    Comments:     No driving until released by the physician.   Increase activity slowly as tolerated  As directed    Lifting restrictions  As directed    Comments:     No lifting until released by the physician.   Patient may shower  As directed    Comments:     You may shower starting Friday.  Do not wash over the wound. Cover the dressing with seran wrap while in shower.  Dry dressing on after the shower.       Medication List         acetaminophen 500 MG tablet  Commonly known as:  TYLENOL  Take 1,000 mg by mouth every 6 (six) hours as needed for pain.     aspirin EC 81 MG tablet  Take 81 mg by mouth daily.     methocarbamol 500 MG tablet  Commonly known as:  ROBAXIN  Take 1 tablet (500 mg total) by mouth every 6 (six) hours as needed.     naproxen sodium 220 MG tablet  Commonly known as:  ANAPROX  Take 440 mg by mouth 2 (two) times daily as needed (for pain).     omeprazole 20 MG capsule  Commonly known as:  PRILOSEC  Take  20 mg by mouth daily.     oxyCODONE-acetaminophen 5-325 MG per tablet  Commonly known as:  PERCOCET/ROXICET  Take 1-2 tablets by mouth every 4 (four) hours as needed.     simvastatin 20 MG tablet  Commonly known as:  ZOCOR  Take 20 mg by mouth every evening.           Follow-up Information   Follow up with Shanieka Blea A, MD. Schedule an appointment as soon as possible for a visit in 2 weeks. (Call the office for an appointment time.)    Specialty:  Orthopedic Surgery   Contact information:   [address] [place] [address] [phone]       Signed: Jacki Cones 11/14/2012, 10:44 AM

## 2012-11-14 NOTE — Progress Notes (Signed)
Subjective: 1 Day Post-Op Procedure(s) (LRB): LEFT SHOULDER ROTATOR CUFF REPAIR WITH GRAFT AND ANCHORS  (Left) Patient reports pain as 3 on 0-10 scale. Doing very Well today   Objective: Vital signs in last 24 hours: Temp:  [97 F (36.1 C)-98.5 F (36.9 C)] 98.4 F (36.9 C) (08/27 0600) Pulse Rate:  [44-93] 71 (08/27 0600) Resp:  [8-20] 18 (08/27 0600) BP: (113-158)/(72-105) 113/72 mmHg (08/27 0600) SpO2:  [95 %-100 %] 96 % (08/27 0600) Weight:  [75.751 kg (167 lb)] 75.751 kg (167 lb) (08/26 1730)  Intake/Output from previous day: 08/26 0701 - 08/27 0700 In: 2694 [I.V.:2694] Out: 2350 [Urine:2350] Intake/Output this shift: Total I/O In: 240 [P.O.:240] Out: 400 [Urine:400]  No results found for this basename: HGB,  in the last 72 hours No results found for this basename: WBC, RBC, HCT, PLT,  in the last 72 hours No results found for this basename: NA, K, CL, CO2, BUN, CREATININE, GLUCOSE, CALCIUM,  in the last 72 hours No results found for this basename: LABPT, INR,  in the last 72 hours  Neurologically intact  Assessment/Plan: 1 Day Post-Op Procedure(s) (LRB): LEFT SHOULDER ROTATOR CUFF REPAIR WITH GRAFT AND ANCHORS  (Left) Up with therapy  Catalia Massett A 11/14/2012, 10:38 AM

## 2012-11-14 NOTE — Op Note (Signed)
NAME:  Nathan Rodriguez, Nathan Rodriguez NO.:  [id number]  MEDICAL RECORD NO.:  [id number]  LOCATION:  1603                         FACILITY:  [facility]  PHYSICIAN:  Georges Lynch. Annaliese Saez, M.D.DATE OF BIRTH:  [birth year]  DATE OF PROCEDURE:  11/13/2012 DATE OF DISCHARGE:                              OPERATIVE REPORT   SURGEON:  Georges Lynch. Darrelyn Hillock, M.D.  ASSISTANT:  Marlowe Kays, M.D.  PREOPERATIVE DIAGNOSES: 1. Complete complex retracted tear of the left rotator cuff secondary     to trauma. 2. 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  POSTOPERATIVE DIAGNOSES: 1. Complete complex retracted tear of the left rotator cuff secondary     to trauma. 2. 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  OPERATIONS: 1. Open acromionectomy, left shoulder. 2. Repair of a complete retracted tear complex of the left rotator     cuff. 3. TissueMend graft. 4. Three anchors were used during the procedure.  DESCRIPTION OF PROCEDURE:  Under general anesthesia, the patient in the beach chair position, routine orthopedic prep and draping of the left shoulder was carried out.  He had 1 g of IV Ancef.  The appropriate time- out was first carried out before making incision.  I also marked the appropriate left arm in the holding area.  At this time, an incision was made over the anterior aspect of the acromion extended down distally. Bleeders were identified and cauterized.  I then detached by sharp dissection in the deltoid tendon from the acromion.  He had a marked overgrowth of his acromion.  I first went down to protect the underlying cuff that was remaining with the Bennett retractor.  I utilized the oscillating saw and burred the partial acromionectomy and acromioplasty. Once I re-established the subacromial space, I utilized a burr to burr down the lateral articular surface of the proximal humerus.  Following that, I thoroughly irrigated out the  area of bone, waxed the undersurface of the acromion.  I then went down and utilized Kocher clamp to bring the tendon forward.  I utilized the gloved finger to free up all the adhesions.  I had a nice large piece of tendon to bring forward.  I utilized 2 anchors initially, inserted in the proximal humerus to suture the tendon down in place.  Following that, a third anchor was inserted for use of the TissueMend graft, which I used to reinforce the defect.  I thoroughly irrigated out the area.  We had a nice strong repair.  I closed the wound in layers in usual fashion.  I did inject 10 mL of Exparel on the wound site.  The subcu was closed with 0 Vicryl, skin with metal staples, and a sterile Neosporin dressing was applied, and he was placed in a shoulder immobilizer.          ______________________________ Georges Lynch Darrelyn Hillock, M.D.     RAG/MEDQ  D:  11/13/2012  T:  11/14/2012  Job:  [id number]

## 2012-11-14 NOTE — Evaluation (Signed)
Occupational Therapy Evaluation Patient Details Name: TOMY KHIM MRN: [id number] DOB: 03-May-1945 Today's Date: 11/14/2012 Time: 4098-1191 OT Time Calculation (min): 34 min  OT Assessment / Plan / Recommendation History of present illness Pt is s/p L rotator cuff repair   Clinical Impression   All education completed with pt and spouse. Pt tolerated all very well. Wife can assist PRN at d/c.     OT Assessment  Progress rehab of shoulder as ordered by MD at follow-up appointment    Follow Up Recommendations  Supervision/Assistance - 24 hour    Barriers to Discharge      Equipment Recommendations  None recommended by OT    Recommendations for Other Services    Frequency       Precautions / Restrictions Precautions Precautions: Shoulder Shoulder Interventions: Shoulder sling/immobilizer;Off for dressing/bathing/exercises Precaution Booklet Issued: Yes (comment) Precaution Comments: Issued shoulder care handout and reviewed all info with pt/spouse Restrictions Weight Bearing Restrictions: Yes LUE Weight Bearing: Non weight bearing   Pertinent Vitals/Pain 3/10 L shoulder; reposition, premedicated    ADL       OT Diagnosis:    OT Problem List:   OT Treatment Interventions:     OT Goals(Current goals can be found in the care plan section)    Visit Information  Last OT Received On: 11/14/12 Assistance Needed: +1 History of Present Illness: Pt is s/p L rotator cuff repair       Prior Functioning     Home Living Family/patient expects to be discharged to:: Private residence Living Arrangements: Spouse/significant other Prior Function Level of Independence: Independent Communication Communication: No difficulties Dominant Hand: Left         Vision/Perception     Cognition  Cognition Arousal/Alertness: Awake/alert Behavior During Therapy: WFL for tasks assessed/performed Overall Cognitive Status: Within Functional Limits for tasks assessed     Extremity/Trunk Assessment       Mobility       Exercise Other Exercises Other Exercises: seated elbow flexion/extension to mouth and then lap only X 10, wrist and digit ROM X 10   Donning/doffing shirt without moving shoulder: Caregiver independent with task Method for sponge bathing under operated UE: Patient able to independently direct caregiver; caregiver verbalizes understanding. Donning/doffing sling/immobilizer: Caregiver independent with task Correct positioning of sling/immobilizer: Caregiver independent with task, pt able to independent direct caregiver ROM for elbow, wrist and digits of operated UE: Supervision/safety Sling wearing schedule (on at all times/off for ADL's): Independent;Caregiver independent with task Proper positioning of operated UE when showering: Independent;Caregiver independent with task Positioning of UE while sleeping: Caregiver independent with task;Patient able to independently direct caregiver   Balance     End of Session OT - End of Session Activity Tolerance: Patient tolerated treatment well Patient left: in chair;with call bell/phone within reach;with family/visitor present  GO Functional Assessment Tool Used: clinical judgement Functional Limitation: Self care Self Care Current Status (Y7829): At least 20 percent but less than 40 percent impaired, limited or restricted Self Care Goal Status (F6213): At least 20 percent but less than 40 percent impaired, limited or restricted Self Care Discharge Status [id number]): At least 20 percent but less than 40 percent impaired, limited or restricted   Lennox Laity [phone] 11/14/2012, 12:10 PM

## 2012-11-14 NOTE — Progress Notes (Signed)
Utilization review completed.  

## 2012-11-14 NOTE — Progress Notes (Signed)
   Subjective: 1 Day Post-Op Procedure(s) (LRB): LEFT SHOULDER ROTATOR CUFF REPAIR WITH GRAFT AND ANCHORS  (Left) Patient reports pain as mild.   Patient seen in rounds with Dr. Lequita Halt. Patient is well, and has had no acute complaints or problems Patient is ready to go home  Objective: Vital signs in last 24 hours: Temp:  [97 F (36.1 C)-98.5 F (36.9 C)] 98.4 F (36.9 C) (08/27 0600) Pulse Rate:  [44-93] 71 (08/27 0600) Resp:  [8-20] 18 (08/27 0600) BP: (113-158)/(72-105) 113/72 mmHg (08/27 0600) SpO2:  [95 %-100 %] 96 % (08/27 0600) Weight:  [75.751 kg (167 lb)] 75.751 kg (167 lb) (08/26 1730)  Intake/Output from previous day:  Intake/Output Summary (Last 24 hours) at 11/14/12 0853 Last data filed at 11/14/12 0547  Gross per 24 hour  Intake   2694 ml  Output   2350 ml  Net    344 ml    Intake/Output this shift:    Labs: No results found for this basename: HGB,  in the last 72 hours No results found for this basename: WBC, RBC, HCT, PLT,  in the last 72 hours No results found for this basename: NA, K, CL, CO2, BUN, CREATININE, GLUCOSE, CALCIUM,  in the last 72 hours No results found for this basename: LABPT, INR,  in the last 72 hours  EXAM: General - Patient is Alert and Appropriate Extremity - Neurovascular intact Sensation intact distally Intact pulses distally Dressing intact Motor Function - intact, moving hand and figners well on exam.   Assessment/Plan: 1 Day Post-Op Procedure(s) (LRB): LEFT SHOULDER ROTATOR CUFF REPAIR WITH GRAFT AND ANCHORS  (Left) Procedure(s) (LRB): LEFT SHOULDER ROTATOR CUFF REPAIR WITH GRAFT AND ANCHORS  (Left) Past Medical History  Diagnosis Date  . Hypercholesteremia   . GERD (gastroesophageal reflux disease)   . Anginal pain 2012  . Coronary artery disease   . Hypertension   . Sleep apnea     mild, CPAP  . Arthritis   . Nocturia   . Occipital headache     sight changes without migraines, for over 30  years  .  Scoliosis   . DDD (degenerative disc disease)   . Complication of anesthesia     hard time to wake up  . Tinnitus    Active Problems:   Complete tear of left rotator cuff  Estimated body mass index is 23.3 kg/(m^2) as calculated from the following:   Height as of this encounter: 5\' 11"  (1.803 m).   Weight as of this encounter: 75.751 kg (167 lb). Discharge home Diet - Cardiac diet Follow up - in 2 weeks Activity - up ad lib, sling at all times Disposition - Home Condition Upon Discharge - Good D/C Meds - See DC Summary  Jamaira Sherk 11/14/2012, 8:53 AM

## 2013-04-02 ENCOUNTER — Encounter (HOSPITAL_BASED_OUTPATIENT_CLINIC_OR_DEPARTMENT_OTHER): Payer: Self-pay | Admitting: Emergency Medicine

## 2013-04-02 DIAGNOSIS — M129 Arthropathy, unspecified: Secondary | ICD-10-CM | POA: Insufficient documentation

## 2013-04-02 DIAGNOSIS — Z95818 Presence of other cardiac implants and grafts: Secondary | ICD-10-CM | POA: Insufficient documentation

## 2013-04-02 DIAGNOSIS — S0180XA Unspecified open wound of other part of head, initial encounter: Secondary | ICD-10-CM | POA: Insufficient documentation

## 2013-04-02 DIAGNOSIS — W010XXA Fall on same level from slipping, tripping and stumbling without subsequent striking against object, initial encounter: Secondary | ICD-10-CM | POA: Insufficient documentation

## 2013-04-02 DIAGNOSIS — Y939 Activity, unspecified: Secondary | ICD-10-CM | POA: Insufficient documentation

## 2013-04-02 DIAGNOSIS — Z23 Encounter for immunization: Secondary | ICD-10-CM | POA: Insufficient documentation

## 2013-04-02 DIAGNOSIS — I209 Angina pectoris, unspecified: Secondary | ICD-10-CM | POA: Insufficient documentation

## 2013-04-02 DIAGNOSIS — I2510 Atherosclerotic heart disease of native coronary artery without angina pectoris: Secondary | ICD-10-CM | POA: Insufficient documentation

## 2013-04-02 DIAGNOSIS — Z8669 Personal history of other diseases of the nervous system and sense organs: Secondary | ICD-10-CM | POA: Insufficient documentation

## 2013-04-02 DIAGNOSIS — Y929 Unspecified place or not applicable: Secondary | ICD-10-CM | POA: Insufficient documentation

## 2013-04-02 DIAGNOSIS — Z7982 Long term (current) use of aspirin: Secondary | ICD-10-CM | POA: Insufficient documentation

## 2013-04-02 DIAGNOSIS — G4730 Sleep apnea, unspecified: Secondary | ICD-10-CM | POA: Insufficient documentation

## 2013-04-02 DIAGNOSIS — Z87891 Personal history of nicotine dependence: Secondary | ICD-10-CM | POA: Insufficient documentation

## 2013-04-02 DIAGNOSIS — I10 Essential (primary) hypertension: Secondary | ICD-10-CM | POA: Insufficient documentation

## 2013-04-02 DIAGNOSIS — E7800 Pure hypercholesterolemia, unspecified: Secondary | ICD-10-CM | POA: Insufficient documentation

## 2013-04-02 DIAGNOSIS — Z79899 Other long term (current) drug therapy: Secondary | ICD-10-CM | POA: Insufficient documentation

## 2013-04-02 DIAGNOSIS — Z9889 Other specified postprocedural states: Secondary | ICD-10-CM | POA: Insufficient documentation

## 2013-04-02 DIAGNOSIS — K219 Gastro-esophageal reflux disease without esophagitis: Secondary | ICD-10-CM | POA: Insufficient documentation

## 2013-04-02 NOTE — ED Notes (Signed)
Pt c/o head injury with laceration to left temple x 1 hr ago

## 2013-04-03 ENCOUNTER — Emergency Department (HOSPITAL_BASED_OUTPATIENT_CLINIC_OR_DEPARTMENT_OTHER)
Admission: EM | Admit: 2013-04-03 | Discharge: 2013-04-03 | Disposition: A | Discharge status: Home / Self Care | Payer: Self-pay | Attending: Emergency Medicine | Admitting: Emergency Medicine

## 2013-04-03 DIAGNOSIS — S0181XA Laceration without foreign body of other part of head, initial encounter: Secondary | ICD-10-CM

## 2013-04-03 MED ORDER — TETANUS-DIPHTH-ACELL PERTUSSIS 5-2.5-18.5 LF-MCG/0.5 IM SUSP
0.5000 mL | Freq: Once | INTRAMUSCULAR | Status: AC
  Administered 2013-04-03: 0.5 mL via INTRAMUSCULAR
  Filled 2013-04-03: qty 0.5

## 2013-04-03 NOTE — Discharge Instructions (Signed)
Facial Laceration ° A facial laceration is a cut on the face. These injuries can be painful and cause bleeding. Lacerations usually heal quickly, but they need special care to reduce scarring. °DIAGNOSIS  °Your health care provider will take a medical history, ask for details about how the injury occurred, and examine the wound to determine how deep the cut is. °TREATMENT  °Some facial lacerations may not require closure. Others may not be able to be closed because of an increased risk of infection. The risk of infection and the chance for successful closure will depend on various factors, including the amount of time since the injury occurred. °The wound may be cleaned to help prevent infection. If closure is appropriate, pain medicines may be given if needed. Your health care provider will use stitches (sutures), wound glue (adhesive), or skin adhesive strips to repair the laceration. These tools bring the skin edges together to allow for faster healing and a better cosmetic outcome. If needed, you may also be given a tetanus shot. °HOME CARE INSTRUCTIONS °· Only take over-the-counter or prescription medicines as directed by your health care provider. °· Follow your health care provider's instructions for wound care. These instructions will vary depending on the technique used for closing the wound. ° °For Sutures: °· Keep the wound clean and dry.   °· If you were given a bandage (dressing), you should change it at least once a day. Also change the dressing if it becomes wet or dirty, or as directed by your health care provider.   °· Wash the wound with soap and water 2 times a day. Rinse the wound off with water to remove all soap. Pat the wound dry with a clean towel.   °· After cleaning, apply a thin layer of the antibiotic ointment recommended by your health care provider. This will help prevent infection and keep the dressing from sticking.   °· You may shower as usual after the first 24 hours. Do not soak  the wound in water until the sutures are removed.   °· Get your sutures removed as directed by your health care provider. With facial lacerations, sutures should usually be taken out after 4 5 days to avoid stitch marks.   °· Wait a few days after your sutures are removed before applying any makeup. ° °After Healing: °Once the wound has healed, cover the wound with sunscreen during the day for 1 full year. This can help minimize scarring. Exposure to ultraviolet light in the first year will darken the scar. It can take 1 2 years for the scar to lose its redness and to heal completely.  °SEEK IMMEDIATE MEDICAL CARE IF: °· You have redness, pain, or swelling around the wound.   °· You see a yellowish-white fluid (pus) coming from the wound.   °· You have chills or a fever.   °MAKE SURE YOU: °· Understand these instructions. °· Will watch your condition. °· Will get help right away if you are not doing well or get worse. °Document Released: 04/14/2004 Document Revised: 12/26/2012 Document Reviewed: 10/18/2012 °Exit[employer]® Patient Information ©2014 [employer]. ° °

## 2013-04-03 NOTE — ED Notes (Signed)
MD at bedside. 
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
## 2013-11-18 DIAGNOSIS — M719 Bursopathy, unspecified: Secondary | ICD-10-CM | POA: Insufficient documentation

## 2013-11-18 DIAGNOSIS — J309 Allergic rhinitis, unspecified: Secondary | ICD-10-CM | POA: Insufficient documentation

## 2013-11-18 DIAGNOSIS — R079 Chest pain, unspecified: Secondary | ICD-10-CM | POA: Insufficient documentation

## 2013-11-18 DIAGNOSIS — I10 Essential (primary) hypertension: Secondary | ICD-10-CM | POA: Insufficient documentation

## 2013-11-18 DIAGNOSIS — N138 Other obstructive and reflux uropathy: Secondary | ICD-10-CM | POA: Insufficient documentation

## 2013-11-18 DIAGNOSIS — M722 Plantar fascial fibromatosis: Secondary | ICD-10-CM | POA: Insufficient documentation

## 2013-11-18 DIAGNOSIS — K219 Gastro-esophageal reflux disease without esophagitis: Secondary | ICD-10-CM | POA: Insufficient documentation

## 2013-11-18 DIAGNOSIS — R350 Frequency of micturition: Secondary | ICD-10-CM | POA: Insufficient documentation

## 2013-11-18 DIAGNOSIS — D126 Benign neoplasm of colon, unspecified: Secondary | ICD-10-CM | POA: Insufficient documentation

## 2013-11-18 DIAGNOSIS — E7800 Pure hypercholesterolemia, unspecified: Secondary | ICD-10-CM | POA: Insufficient documentation

## 2013-11-18 DIAGNOSIS — R351 Nocturia: Secondary | ICD-10-CM | POA: Insufficient documentation

## 2013-11-18 DIAGNOSIS — I2510 Atherosclerotic heart disease of native coronary artery without angina pectoris: Secondary | ICD-10-CM | POA: Insufficient documentation

## 2013-11-18 DIAGNOSIS — H9190 Unspecified hearing loss, unspecified ear: Secondary | ICD-10-CM | POA: Insufficient documentation

## 2013-11-18 DIAGNOSIS — R000 Tachycardia, unspecified: Secondary | ICD-10-CM | POA: Insufficient documentation

## 2013-11-18 DIAGNOSIS — N529 Male erectile dysfunction, unspecified: Secondary | ICD-10-CM | POA: Insufficient documentation

## 2013-11-18 DIAGNOSIS — N401 Enlarged prostate with lower urinary tract symptoms: Secondary | ICD-10-CM

## 2015-08-20 ENCOUNTER — Telehealth: Payer: Self-pay | Admitting: Cardiovascular Disease

## 2015-08-20 NOTE — Telephone Encounter (Signed)
Received records from [facility] for appointment on 09/02/15 with Dr Gwenlyn Found.  Records given to [facility] (medical records) for Dr Kennon Holter schedule on 09/02/15.

## 2015-08-25 ENCOUNTER — Other Ambulatory Visit: Payer: Self-pay | Admitting: *Deleted

## 2015-08-25 ENCOUNTER — Encounter: Payer: Self-pay | Admitting: Cardiovascular Disease

## 2015-08-25 ENCOUNTER — Ambulatory Visit (INDEPENDENT_AMBULATORY_CARE_PROVIDER_SITE_OTHER): Payer: Medicare HMO | Admitting: Cardiovascular Disease

## 2015-08-25 ENCOUNTER — Telehealth: Payer: Self-pay | Admitting: Cardiovascular Disease

## 2015-08-25 VITALS — BP 141/93 | HR 75 | Ht 71.0 in | Wt 160.2 lb

## 2015-08-25 DIAGNOSIS — E785 Hyperlipidemia, unspecified: Secondary | ICD-10-CM

## 2015-08-25 DIAGNOSIS — Z7901 Long term (current) use of anticoagulants: Secondary | ICD-10-CM | POA: Diagnosis not present

## 2015-08-25 DIAGNOSIS — R0600 Dyspnea, unspecified: Secondary | ICD-10-CM | POA: Diagnosis not present

## 2015-08-25 DIAGNOSIS — I2510 Atherosclerotic heart disease of native coronary artery without angina pectoris: Secondary | ICD-10-CM | POA: Diagnosis not present

## 2015-08-25 DIAGNOSIS — Z79899 Other long term (current) drug therapy: Secondary | ICD-10-CM | POA: Diagnosis not present

## 2015-08-25 DIAGNOSIS — I2583 Coronary atherosclerosis due to lipid rich plaque: Secondary | ICD-10-CM

## 2015-08-25 DIAGNOSIS — Z0000 Encounter for general adult medical examination without abnormal findings: Secondary | ICD-10-CM

## 2015-08-25 DIAGNOSIS — R0609 Other forms of dyspnea: Secondary | ICD-10-CM

## 2015-08-25 DIAGNOSIS — Z01818 Encounter for other preprocedural examination: Secondary | ICD-10-CM

## 2015-08-25 NOTE — Progress Notes (Deleted)
08/25/2015 Nathan Rodriguez   Apr 07, 1945  [id number]  Primary Physician [name], MD Primary Cardiologist: Lorretta Harp MD [name]   HPI:  Mr. Osmanski is a delightful 70 year old thin and fit-appearing married Caucasian male father of one daughter who is a retired Presenter, broadcasting. He was referred by [name] for cardiovascular evaluation because of progressive dyspnea . He has a history of CAD status post rotational atherectomy, PCI and drug eluting stenting 2 by Dr. Ellouise Newer 5-6 years ago of his LAD. He is a Lawyer as noted progressive increasing dyspnea on exertion and decreased exercise tolerance over the last several weeks. This resected include hyperlipidemia not on statin therapy, history of tobacco abuse smoking 5-6 cigarettes a day off and on for the last 40 years.There is a family history of a father who had bypass surgery at age 18.   Current Outpatient Prescriptions  Medication Sig Dispense Refill  . acetaminophen (TYLENOL) 500 MG tablet Take 1,000 mg by mouth every 6 (six) hours as needed for pain.    Marland Kitchen aspirin EC 81 MG tablet Take 81 mg by mouth daily.    Marland Kitchen omeprazole (PRILOSEC) 20 MG capsule Take 20 mg by mouth daily.     No current facility-administered medications for this visit.    No Known Allergies  Social History   Social History  . Marital Status: Married    Spouse Name: N/A  . Number of Children: N/A  . Years of Education: N/A   Occupational History  . Not on file.   Social History Main Topics  . Smoking status: Former Smoker -- 0.25 packs/day for 43 years    Types: Cigarettes    Quit date: 03/21/2002  . Smokeless tobacco: Never Used  . Alcohol Use: Yes     Comment: rare  . Drug Use: No  . Sexual Activity: Not on file   Other Topics Concern  . Not on file   Social History Narrative     Review of Systems: General: negative for chills, fever, night sweats or weight changes.  Cardiovascular:  negative for chest pain, dyspnea on exertion, edema, orthopnea, palpitations, paroxysmal nocturnal dyspnea or shortness of breath Dermatological: negative for rash Respiratory: negative for cough or wheezing Urologic: negative for hematuria Abdominal: negative for nausea, vomiting, diarrhea, bright red blood per rectum, melena, or hematemesis Neurologic: negative for visual changes, syncope, or dizziness All other systems reviewed and are otherwise negative except as noted above.    Blood pressure 141/93, pulse 75, height 5\' 11"  (1.803 m), weight 160 lb 3.2 oz (72.666 kg).  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 I personally reviewed this EKG   ASSESSMENT AND PLAN:   Coronary artery disease History of CAD status post rotational atherectomy, PCI and drug-eluting stenting 2 by Dr. Claiborne Billings 5-6 years ago. He patient denies chest pain but has had progressively disabling dyspnea on exertion. Usually active and competitive bicycler. Based on this, I decided to proceed with outpatient cardiac catheterization via the right radial approach. The patient understands that risks included but are not limited to stroke (1 in 1000), death (  1 in 1000), kidney failure [usually temporary] (1 in 500), bleeding (1 in 200), allergic reaction [possibly serious] (1 in 200). The patient understands and agrees to proceed  Hyperlipidemia History of hyperlipidemia currently not on statin therapy       Lorretta Harp MD [facility], [facility] 08/25/2015 5:03 PM

## 2015-08-25 NOTE — Assessment & Plan Note (Signed)
History of hyperlipidemia currently not on statin therapy. 

## 2015-08-25 NOTE — Patient Instructions (Addendum)
Medication Instructions:  Your physician recommends that you continue on your current medications as directed. Please refer to the Current Medication list given to you today.   Testing/Procedures: Your physician has requested that you have a cardiac catheterization. Cardiac catheterization is used to diagnose and/or treat various heart conditions. Doctors may recommend this procedure for a number of different reasons. The most common reason is to evaluate chest pain. Chest pain can be a symptom of coronary artery disease (CAD), and cardiac catheterization can show whether plaque is narrowing or blocking your heart's arteries. This procedure is also used to evaluate the valves, as well as measure the blood flow and oxygen levels in different parts of your heart. For further information please visit [email or web address].  FOR June 12TH, 2017 WITH DR Gwenlyn Found.  Following your catheterization, you will not be allowed to drive for 3 days.  No lifting, pushing, or pulling greater that 10 pounds is allowed for 1 week.  You will be required to have the following tests prior to the procedure:  1. Blood work-the blood work can be done no more than 7 days prior to the procedure.  It can be done at any [facility] lab.  There is one downstairs on the first floor of this building and one in the [facility] building [phone]).  2. Chest Xray-the chest xray order has already been placed at the [facility].      Puncture site right radial     Follow-Up: Your physician recommends that you schedule a follow-up appointment AROUND 2 WEEKS AFTER YOUR PROCEDURE.   Any Other Special Instructions Will Be Listed Below (If Applicable).     If you need a refill on your cardiac medications before your next appointment, please call your pharmacy.

## 2015-08-25 NOTE — Assessment & Plan Note (Signed)
History of CAD status post rotational atherectomy, PCI and drug-eluting stenting 2 by Dr. Claiborne Billings 5-6 years ago. He patient denies chest pain but has had progressively disabling dyspnea on exertion. Usually active and competitive bicycler. Based on this, I decided to proceed with outpatient cardiac catheterization via the right radial approach. The patient understands that risks included but are not limited to stroke (1 in 1000), death (1 in 74), kidney failure [usually temporary] (1 in 500), bleeding (1 in 200), allergic reaction [possibly serious] (1 in 200). The patient understands and agrees to proceed

## 2015-08-25 NOTE — Progress Notes (Signed)
08/25/2015 Nathan Rodriguez   [birth year]  [id number]  Primary Physician [name], MD Primary Cardiologist: Lorretta Harp MD [name]   HPI:  Nathan Rodriguez is a delightful 70 year old thin and fit-appearing married Caucasian male father of one daughter who is a retired Presenter, broadcasting. He was referred by [name] for cardiovascular evaluation because of progressive dyspnea . He has a history of CAD status post rotational atherectomy, PCI and drug eluting stenting 2 by Dr. Ellouise Newer 5-6 years ago of his LAD. He is a Lawyer as noted progressive increasing dyspnea on exertion and decreased exercise tolerance over the last several weeks. This resected include hyperlipidemia not on statin therapy, history of tobacco abuse smoking 5-6 cigarettes a day off and on for the last 40 years.There is a family history of a father who had bypass surgery at age 49.   Current Outpatient Prescriptions  Medication Sig Dispense Refill  . acetaminophen (TYLENOL) 500 MG tablet Take 1,000 mg by mouth every 6 (six) hours as needed for pain.    Marland Kitchen aspirin EC 81 MG tablet Take 81 mg by mouth daily.    Marland Kitchen omeprazole (PRILOSEC) 20 MG capsule Take 20 mg by mouth daily.     No current facility-administered medications for this visit.    No Known Allergies  Social History   Social History  . Marital Status: Married    Spouse Name: N/A  . Number of Children: N/A  . Years of Education: N/A   Occupational History  . Not on file.   Social History Main Topics  . Smoking status: Former Smoker -- 0.25 packs/day for 43 years    Types: Cigarettes    Quit date: 03/21/2002  . Smokeless tobacco: Never Used  . Alcohol Use: Yes     Comment: rare  . Drug Use: No  . Sexual Activity: Not on file   Other Topics Concern  . Not on file   Social History Narrative     Review of Systems: General: negative for chills, fever, night sweats or weight changes.  Cardiovascular:  negative for chest pain, dyspnea on exertion, edema, orthopnea, palpitations, paroxysmal nocturnal dyspnea or shortness of breath Dermatological: negative for rash Respiratory: negative for cough or wheezing Urologic: negative for hematuria Abdominal: negative for nausea, vomiting, diarrhea, bright red blood per rectum, melena, or hematemesis Neurologic: negative for visual changes, syncope, or dizziness All other systems reviewed and are otherwise negative except as noted above.    Blood pressure 141/93, pulse 75, height 5\' 11"  (1.803 m), weight 160 lb 3.2 oz (72.666 kg).  General appearance: alert and no distress Neck: no adenopathy, no carotid bruit, no JVD, supple, symmetrical, trachea midline and thyroid not enlarged, symmetric, no tenderness/mass/nodules Lungs: clear to auscultation bilaterally Heart: regular rate and rhythm, S1, S2 normal, no murmur, click, rub or gallop Extremities: extremities normal, atraumatic, no cyanosis or edema  EKG not performed today  ASSESSMENT AND PLAN:   Coronary artery disease History of CAD status post rotational atherectomy, PCI and drug-eluting stenting 2 by Dr. Claiborne Billings 5-6 years ago. He patient denies chest pain but has had progressively disabling dyspnea on exertion. Usually active and competitive bicycler. Based on this, I decided to proceed with outpatient cardiac catheterization via the right radial approach. The patient understands that risks included but are not limited to stroke (1 in 1000), death (1 in 41), kidney failure [usually temporary] (1 in 500), bleeding (1 in 200), allergic reaction [possibly serious] (  1 in 200). The patient understands and agrees to proceed  Hyperlipidemia History of hyperlipidemia currently not on statin therapy      Lorretta Harp MD [facility], [facility] 08/25/2015 4:12 PM

## 2015-08-25 NOTE — Telephone Encounter (Signed)
08/25/2015 Received faxed referral packet from [facility] for upcoming appointment with Dr. Gwenlyn Found on 09/02/2015.  Records given to [facility].  cbr

## 2015-08-26 LAB — CBC WITH DIFFERENTIAL/PLATELET
BASOS ABS: 0 {cells}/uL (ref 0–200)
Basophils Relative: 0 %
EOS ABS: 153 {cells}/uL (ref 15–500)
EOS PCT: 3 %
HCT: 48.7 % (ref 38.5–50.0)
HEMOGLOBIN: 16.3 g/dL (ref 13.2–17.1)
LYMPHS ABS: 1071 {cells}/uL (ref 850–3900)
Lymphocytes Relative: 21 %
MCH: 31.2 pg (ref 27.0–33.0)
MCHC: 33.5 g/dL (ref 32.0–36.0)
MCV: 93.3 fL (ref 80.0–100.0)
MONO ABS: 459 {cells}/uL (ref 200–950)
MPV: 9.2 fL (ref 7.5–12.5)
Monocytes Relative: 9 %
NEUTROS ABS: 3417 {cells}/uL (ref 1500–7800)
Neutrophils Relative %: 67 %
Platelets: 197 10*3/uL (ref 140–400)
RBC: 5.22 MIL/uL (ref 4.20–5.80)
RDW: 13.4 % (ref 11.0–15.0)
WBC: 5.1 10*3/uL (ref 3.8–10.8)

## 2015-08-26 LAB — BASIC METABOLIC PANEL
BUN: 20 mg/dL (ref 7–25)
CALCIUM: 9.2 mg/dL (ref 8.6–10.3)
CO2: 24 mmol/L (ref 20–31)
Chloride: 104 mmol/L (ref 98–110)
Creat: 1.1 mg/dL (ref 0.70–1.18)
GLUCOSE: 100 mg/dL — AB (ref 65–99)
POTASSIUM: 4.9 mmol/L (ref 3.5–5.3)
SODIUM: 140 mmol/L (ref 135–146)

## 2015-08-27 LAB — PROTIME-INR
INR: 1.02 (ref <1.50)
Prothrombin Time: 13.5 seconds (ref 11.6–15.2)

## 2015-08-27 LAB — APTT: APTT: 31 s (ref 24–37)

## 2015-08-31 ENCOUNTER — Encounter (HOSPITAL_COMMUNITY)
Admission: RE | Disposition: A | Discharge status: Home / Self Care | Payer: Self-pay | Source: Ambulatory Visit | Attending: Cardiovascular Disease

## 2015-08-31 ENCOUNTER — Ambulatory Visit (HOSPITAL_COMMUNITY)
Admission: RE | Admit: 2015-08-31 | Discharge: 2015-08-31 | Disposition: A | Discharge status: Home / Self Care | Payer: Medicare HMO | Source: Ambulatory Visit | Attending: Cardiovascular Disease | Admitting: Cardiovascular Disease

## 2015-08-31 DIAGNOSIS — Z955 Presence of coronary angioplasty implant and graft: Secondary | ICD-10-CM | POA: Diagnosis not present

## 2015-08-31 DIAGNOSIS — Z8249 Family history of ischemic heart disease and other diseases of the circulatory system: Secondary | ICD-10-CM | POA: Diagnosis not present

## 2015-08-31 DIAGNOSIS — I2510 Atherosclerotic heart disease of native coronary artery without angina pectoris: Secondary | ICD-10-CM | POA: Diagnosis not present

## 2015-08-31 DIAGNOSIS — I2583 Coronary atherosclerosis due to lipid rich plaque: Secondary | ICD-10-CM | POA: Diagnosis not present

## 2015-08-31 DIAGNOSIS — Z79899 Other long term (current) drug therapy: Secondary | ICD-10-CM | POA: Diagnosis not present

## 2015-08-31 DIAGNOSIS — Z87891 Personal history of nicotine dependence: Secondary | ICD-10-CM | POA: Diagnosis not present

## 2015-08-31 DIAGNOSIS — R0609 Other forms of dyspnea: Secondary | ICD-10-CM | POA: Insufficient documentation

## 2015-08-31 DIAGNOSIS — E785 Hyperlipidemia, unspecified: Secondary | ICD-10-CM | POA: Diagnosis not present

## 2015-08-31 DIAGNOSIS — R0600 Dyspnea, unspecified: Secondary | ICD-10-CM | POA: Insufficient documentation

## 2015-08-31 DIAGNOSIS — Z7982 Long term (current) use of aspirin: Secondary | ICD-10-CM | POA: Insufficient documentation

## 2015-08-31 HISTORY — PX: CARDIAC CATHETERIZATION: SHX172

## 2015-08-31 SURGERY — LEFT HEART CATH AND CORONARY ANGIOGRAPHY

## 2015-08-31 MED ORDER — MIDAZOLAM HCL 2 MG/2ML IJ SOLN
INTRAMUSCULAR | Status: DC | PRN
  Administered 2015-08-31: 1 mg via INTRAVENOUS

## 2015-08-31 MED ORDER — SODIUM CHLORIDE 0.9 % WEIGHT BASED INFUSION: 3.0000 mL/kg/h | INTRAVENOUS | Status: DC

## 2015-08-31 MED ORDER — FENTANYL CITRATE (PF) 100 MCG/2ML IJ SOLN
INTRAMUSCULAR | Status: AC
  Filled 2015-08-31: qty 2

## 2015-08-31 MED ORDER — HEPARIN (PORCINE) IN NACL 2-0.9 UNIT/ML-% IJ SOLN
INTRAMUSCULAR | Status: AC
  Filled 2015-08-31: qty 1000

## 2015-08-31 MED ORDER — IOPAMIDOL (ISOVUE-370) INJECTION 76%
INTRAVENOUS | Status: AC
  Filled 2015-08-31: qty 100

## 2015-08-31 MED ORDER — LIDOCAINE HCL (PF) 1 % IJ SOLN
INTRAMUSCULAR | Status: AC
  Filled 2015-08-31: qty 30

## 2015-08-31 MED ORDER — FENTANYL CITRATE (PF) 100 MCG/2ML IJ SOLN
INTRAMUSCULAR | Status: DC | PRN
  Administered 2015-08-31: 25 ug via INTRAVENOUS

## 2015-08-31 MED ORDER — MIDAZOLAM HCL 2 MG/2ML IJ SOLN
INTRAMUSCULAR | Status: AC
  Filled 2015-08-31: qty 2

## 2015-08-31 MED ORDER — SODIUM CHLORIDE 0.9% FLUSH: 3.0000 mL | INTRAVENOUS | Status: DC | PRN

## 2015-08-31 MED ORDER — SODIUM CHLORIDE 0.9% FLUSH: 3.0000 mL | Freq: Two times a day (BID) | INTRAVENOUS | Status: DC

## 2015-08-31 MED ORDER — HEPARIN (PORCINE) IN NACL 2-0.9 UNIT/ML-% IJ SOLN
INTRAMUSCULAR | Status: DC | PRN
  Administered 2015-08-31: 1000 mL

## 2015-08-31 MED ORDER — VERAPAMIL HCL 2.5 MG/ML IV SOLN
INTRAVENOUS | Status: AC
  Filled 2015-08-31: qty 2

## 2015-08-31 MED ORDER — SODIUM CHLORIDE 0.9 % WEIGHT BASED INFUSION
1.0000 mL/kg/h | INTRAVENOUS | Status: DC
Start: 2015-08-31 — End: 2015-08-31
  Administered 2015-08-31: 1 mL/kg/h via INTRAVENOUS

## 2015-08-31 MED ORDER — SODIUM CHLORIDE 0.9 % IV SOLN: 250.0000 mL | INTRAVENOUS | Status: DC | PRN

## 2015-08-31 MED ORDER — ASPIRIN 81 MG PO CHEW: 81.0000 mg | CHEWABLE_TABLET | Freq: Every day | ORAL | Status: DC

## 2015-08-31 MED ORDER — ACETAMINOPHEN 325 MG PO TABS: 650.0000 mg | ORAL_TABLET | ORAL | Status: DC | PRN

## 2015-08-31 MED ORDER — VERAPAMIL HCL 2.5 MG/ML IV SOLN: INTRA_ARTERIAL | Status: DC | PRN

## 2015-08-31 MED ORDER — ONDANSETRON HCL 4 MG/2ML IJ SOLN: 4.0000 mg | Freq: Four times a day (QID) | INTRAMUSCULAR | Status: DC | PRN

## 2015-08-31 MED ORDER — SODIUM CHLORIDE 0.9 % WEIGHT BASED INFUSION
3.0000 mL/kg/h | INTRAVENOUS | Status: AC
  Administered 2015-08-31: 3 mL/kg/h via INTRAVENOUS

## 2015-08-31 MED ORDER — NITROGLYCERIN 1 MG/10 ML FOR IR/CATH LAB
INTRA_ARTERIAL | Status: AC
  Filled 2015-08-31: qty 10

## 2015-08-31 MED ORDER — ASPIRIN 81 MG PO CHEW
CHEWABLE_TABLET | ORAL | Status: AC
  Filled 2015-08-31: qty 1

## 2015-08-31 MED ORDER — IOPAMIDOL (ISOVUE-370) INJECTION 76%
INTRAVENOUS | Status: DC | PRN
  Administered 2015-08-31: 50 mL via INTRA_ARTERIAL

## 2015-08-31 MED ORDER — ASPIRIN 81 MG PO CHEW
81.0000 mg | CHEWABLE_TABLET | ORAL | Status: AC
  Administered 2015-08-31: 81 mg via ORAL

## 2015-08-31 MED ORDER — LIDOCAINE HCL (PF) 1 % IJ SOLN
INTRAMUSCULAR | Status: DC | PRN
  Administered 2015-08-31: 2 mL via SUBCUTANEOUS
  Administered 2015-08-31: 24 mL via SUBCUTANEOUS

## 2015-08-31 MED ORDER — MORPHINE SULFATE (PF) 2 MG/ML IV SOLN: 2.0000 mg | INTRAVENOUS | Status: DC | PRN

## 2015-08-31 SURGICAL SUPPLY — 11 items
CATH INFINITI 5FR MULTPACK ANG (CATHETERS) ×3 IMPLANT
DEVICE CLOSURE MYNXGRIP 5F (Vascular Products) ×3 IMPLANT
GLIDESHEATH SLEND A-KIT 6F 22G (SHEATH) ×3 IMPLANT
KIT HEART LEFT (KITS) ×3 IMPLANT
PACK CARDIAC CATHETERIZATION (CUSTOM PROCEDURE TRAY) ×3 IMPLANT
SHEATH PINNACLE 5F 10CM (SHEATH) ×3 IMPLANT
SYR MEDRAD MARK V 150ML (SYRINGE) ×3 IMPLANT
TRANSDUCER W/STOPCOCK (MISCELLANEOUS) ×3 IMPLANT
TUBING CIL FLEX 10 FLL-RA (TUBING) ×3 IMPLANT
WIRE EMERALD 3MM-J .035X150CM (WIRE) ×3 IMPLANT
WIRE HI TORQ VERSACORE-J 145CM (WIRE) ×3 IMPLANT

## 2015-08-31 NOTE — Discharge Instructions (Signed)
Angiogram, Care After °Refer to this sheet in the next few weeks. These instructions provide you with information about caring for yourself after your procedure. Your health care provider may also give you more specific instructions. Your treatment has been planned according to current medical practices, but problems sometimes occur. Call your health care provider if you have any problems or questions after your procedure. °WHAT TO EXPECT AFTER THE PROCEDURE °After your procedure, it is typical to have the following: °· Bruising at the catheter insertion site that usually fades within 1-2 weeks. °· Blood collecting in the tissue (hematoma) that may be painful to the touch. It should usually decrease in size and tenderness within 1-2 weeks. °HOME CARE INSTRUCTIONS °· Take medicines only as directed by your health care provider. °· You may shower 24-48 hours after the procedure or as directed by your health care provider. Remove the bandage (dressing) and gently wash the site with plain soap and water. Pat the area dry with a clean towel. Do not rub the site, because this may cause bleeding. °· Do not take baths, swim, or use a hot tub until your health care provider approves. °· Check your insertion site every day for redness, swelling, or drainage. °· Do not apply powder or lotion to the site. °· Do not lift over 10 lb (4.5 kg) for 5 days after your procedure or as directed by your health care provider. °· Ask your health care provider when it is okay to: °¨ Return to work or school. °¨ Resume usual physical activities or sports. °¨ Resume sexual activity. °· Do not drive home if you are discharged the same day as the procedure. Have someone else drive you. °· You may drive 24 hours after the procedure unless otherwise instructed by your health care provider. °· Do not operate machinery or power tools for 24 hours after the procedure or as directed by your health care provider. °· If your procedure was done as an  outpatient procedure, which means that you went home the same day as your procedure, a responsible adult should be with you for the first 24 hours after you arrive home. °· Keep all follow-up visits as directed by your health care provider. This is important. °SEEK MEDICAL CARE IF: °· You have a fever. °· You have chills. °· You have increased bleeding from the catheter insertion site. Hold pressure on the site. °SEEK IMMEDIATE MEDICAL CARE IF: °· You have unusual pain at the catheter insertion site. °· You have redness, warmth, or swelling at the catheter insertion site. °· You have drainage (other than a small amount of blood on the dressing) from the catheter insertion site. °· The catheter insertion site is bleeding, and the bleeding does not stop after 30 minutes of holding steady pressure on the site. °· The area near or just beyond the catheter insertion site becomes pale, cool, tingly, or numb. °  °This information is not intended to replace advice given to you by your health care provider. Make sure you discuss any questions you have with your health care provider. °  °Document Released: 09/23/2004 Document Revised: 03/28/2014 Document Reviewed: 08/08/2012 °Elsevier Interactive Patient Education ©2016 [employer] Inc[employer] ° °

## 2015-09-01 ENCOUNTER — Encounter (HOSPITAL_COMMUNITY): Payer: Self-pay | Admitting: Cardiovascular Disease

## 2015-09-01 MED FILL — Nitroglycerin IV Soln 100 MCG/ML in D5W: INTRA_ARTERIAL | Qty: 10 | Status: AC

## 2015-09-01 MED FILL — Verapamil HCl IV Soln 2.5 MG/ML: INTRAVENOUS | Qty: 2 | Status: AC

## 2015-09-02 ENCOUNTER — Ambulatory Visit: Payer: Self-pay | Admitting: Cardiovascular Disease

## 2015-09-07 ENCOUNTER — Telehealth: Payer: Self-pay | Admitting: *Deleted

## 2015-09-09 NOTE — Telephone Encounter (Signed)
OPEN ERROR

## 2015-09-10 ENCOUNTER — Encounter: Payer: Self-pay | Admitting: Nurse Practitioner

## 2015-09-10 ENCOUNTER — Ambulatory Visit (INDEPENDENT_AMBULATORY_CARE_PROVIDER_SITE_OTHER): Payer: Medicare HMO | Admitting: Nurse Practitioner

## 2015-09-10 VITALS — BP 126/72 | HR 86 | Ht 70.0 in | Wt 159.2 lb

## 2015-09-10 DIAGNOSIS — I119 Hypertensive heart disease without heart failure: Secondary | ICD-10-CM

## 2015-09-10 DIAGNOSIS — I739 Peripheral vascular disease, unspecified: Secondary | ICD-10-CM | POA: Diagnosis not present

## 2015-09-10 DIAGNOSIS — I2510 Atherosclerotic heart disease of native coronary artery without angina pectoris: Secondary | ICD-10-CM | POA: Diagnosis not present

## 2015-09-10 DIAGNOSIS — R0609 Other forms of dyspnea: Secondary | ICD-10-CM | POA: Diagnosis not present

## 2015-09-10 NOTE — Patient Instructions (Addendum)
Medication Instructions: Nathan Bayley, NP, recommends that you continue on your current medications as directed. Please refer to the Current Medication list given to you today.  Labwork: NONE ORDERED  Testing/Procedures: 1. Echocardiogram - Your physician has requested that you have an echocardiogram. Echocardiography is a painless test that uses sound waves to create images of your heart. It provides your doctor with information about the size and shape of your heart and how well your heart's chambers and valves are working. This procedure takes approximately one hour. There are no restrictions for this procedure. This will be done at our [facility] location - [address] [place] physician has requested that you have an ankle brachial index (ABI). During this test an ultrasound and blood pressure cuff are used to evaluate the arteries that supply the arms and legs with blood. Allow thirty minutes for this exam. There are no restrictions or special instructions.   Follow-up: Nathan Rodriguez recommends that you schedule a follow-up appointment in 3 months with Dr Gwenlyn Found.  If you need a refill on your cardiac medications before your next appointment, please call your pharmacy.
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
## 2015-09-14 ENCOUNTER — Other Ambulatory Visit: Payer: Self-pay | Admitting: Nurse Practitioner

## 2015-09-14 DIAGNOSIS — I739 Peripheral vascular disease, unspecified: Secondary | ICD-10-CM

## 2015-09-19 DIAGNOSIS — I82409 Acute embolism and thrombosis of unspecified deep veins of unspecified lower extremity: Secondary | ICD-10-CM | POA: Insufficient documentation

## 2015-09-19 DIAGNOSIS — I2699 Other pulmonary embolism without acute cor pulmonale: Secondary | ICD-10-CM | POA: Insufficient documentation

## 2015-09-28 ENCOUNTER — Inpatient Hospital Stay (HOSPITAL_COMMUNITY): Admission: RE | Admit: 2015-09-28 | Payer: Medicare HMO | Source: Ambulatory Visit

## 2015-10-01 ENCOUNTER — Other Ambulatory Visit: Payer: Self-pay

## 2015-10-01 ENCOUNTER — Ambulatory Visit (HOSPITAL_COMMUNITY): Payer: Medicare HMO | Attending: Internal Medicine

## 2015-10-01 DIAGNOSIS — I2510 Atherosclerotic heart disease of native coronary artery without angina pectoris: Secondary | ICD-10-CM | POA: Diagnosis not present

## 2015-10-01 DIAGNOSIS — R0600 Dyspnea, unspecified: Secondary | ICD-10-CM | POA: Diagnosis present

## 2015-10-01 DIAGNOSIS — E7800 Pure hypercholesterolemia, unspecified: Secondary | ICD-10-CM | POA: Diagnosis not present

## 2015-10-01 DIAGNOSIS — G4730 Sleep apnea, unspecified: Secondary | ICD-10-CM | POA: Insufficient documentation

## 2015-10-01 DIAGNOSIS — I517 Cardiomegaly: Secondary | ICD-10-CM | POA: Diagnosis not present

## 2015-10-01 DIAGNOSIS — R0609 Other forms of dyspnea: Secondary | ICD-10-CM | POA: Insufficient documentation

## 2015-10-01 DIAGNOSIS — I340 Nonrheumatic mitral (valve) insufficiency: Secondary | ICD-10-CM | POA: Diagnosis not present

## 2015-10-02 ENCOUNTER — Telehealth: Payer: Self-pay | Admitting: Nurse Practitioner

## 2015-10-02 ENCOUNTER — Other Ambulatory Visit: Payer: Self-pay

## 2015-10-02 MED ORDER — CARVEDILOL 3.125 MG PO TABS: 3.1250 mg | ORAL_TABLET | Freq: Two times a day (BID) | ORAL | Status: DC

## 2015-10-02 NOTE — Telephone Encounter (Signed)
Per note from [facility] patient request fu with Sharolyn Douglas at [place] office.  Called patient to schedule.  LM with spouse to call office.

## 2015-10-07 ENCOUNTER — Inpatient Hospital Stay (HOSPITAL_COMMUNITY): Admission: RE | Admit: 2015-10-07 | Payer: Medicare HMO | Source: Ambulatory Visit

## 2015-10-09 DIAGNOSIS — I82432 Acute embolism and thrombosis of left popliteal vein: Secondary | ICD-10-CM | POA: Insufficient documentation

## 2015-10-12 ENCOUNTER — Other Ambulatory Visit (HOSPITAL_COMMUNITY): Payer: Self-pay | Admitting: Interventional Radiology

## 2015-10-12 DIAGNOSIS — I82402 Acute embolism and thrombosis of unspecified deep veins of left lower extremity: Secondary | ICD-10-CM

## 2015-10-13 ENCOUNTER — Encounter (HOSPITAL_COMMUNITY): Payer: Medicare HMO

## 2015-10-16 ENCOUNTER — Telehealth: Payer: Self-pay | Admitting: *Deleted

## 2015-10-16 NOTE — Telephone Encounter (Signed)
I called Nathan Rodriguez to schedule his f/u with Dr. Laurence Ferrari from the IV Filter placement 10-10-15. During our conversation the patient ask if he could resume exercise and if so are there any dangers. Pushups, crunches,scissor kicks and cycling. I s/w Gareth Eagle, PA and she states -ok to resume exercise, she actually encourages exercise and there are no dangers. I relayed the msg to the patient he was very excited to resume exercise./vm

## 2015-10-30 ENCOUNTER — Telehealth: Payer: Self-pay | Admitting: Cardiovascular Disease

## 2015-10-30 NOTE — Telephone Encounter (Signed)
10/30/2015 Received fax referral packet from [facility] for upcoming appointment with Dr. Gwenlyn Found on 12/15/2015.  Records given to [facility]. cbr

## 2015-11-05 ENCOUNTER — Ambulatory Visit: Payer: Self-pay | Admitting: Nurse Practitioner

## 2015-12-14 DIAGNOSIS — D6851 Activated protein C resistance: Secondary | ICD-10-CM | POA: Insufficient documentation

## 2015-12-15 ENCOUNTER — Ambulatory Visit: Payer: Self-pay | Admitting: Cardiovascular Disease

## 2015-12-17 ENCOUNTER — Ambulatory Visit
Admission: RE | Admit: 2015-12-17 | Discharge: 2015-12-17 | Disposition: A | Discharge status: Home / Self Care | Payer: Medicare HMO | Source: Ambulatory Visit | Attending: Interventional Radiology | Admitting: Interventional Radiology

## 2015-12-17 DIAGNOSIS — I82402 Acute embolism and thrombosis of unspecified deep veins of left lower extremity: Secondary | ICD-10-CM
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
## 2016-02-10 ENCOUNTER — Encounter: Payer: Self-pay | Admitting: Cardiology

## 2016-02-10 ENCOUNTER — Ambulatory Visit (INDEPENDENT_AMBULATORY_CARE_PROVIDER_SITE_OTHER): Payer: Medicare HMO | Admitting: Cardiology

## 2016-02-10 VITALS — BP 124/84 | HR 66 | Ht 70.5 in | Wt 167.4 lb

## 2016-02-10 DIAGNOSIS — R002 Palpitations: Secondary | ICD-10-CM

## 2016-02-10 NOTE — Patient Instructions (Addendum)
Medication Instructions:    Your physician recommends that you continue on your current medications as directed. Please refer to the Current Medication list given to you today.   If you need a refill on your cardiac medications before your next appointment, please call your pharmacy.  Labwork: NONE ORDERED  TODAY   Testing/Procedures: NONE ORDERED  TODAY    Follow-Up:  CONTACT CLINIC BACK AS SOON AS YOU CAN ABOUT  IMPLANTABLE LOOP RECORDER (ILR).. MONITORING DEVICE ONCE CONTACTED INSURANCE COMPANY FOR COVERAGE    Any Other Special Instructions Will Be Listed Below (If Applicable).                                                                                                                                              Implantable Loop Recorder Placement Introduction An implantable loop recorder is a small electronic device that is placed under the skin of your chest. It is about the size of an AA ("double A") battery. The device records the electrical activity of your heart over a long period of time. Your health care provider can download these recordings to monitor your heart. You may need an implantable loop recorder if you have periods of abnormal heart activity (arrhythmias) or unexplained fainting (syncope) caused by a heart problem. Tell a health care provider about:  Any allergies you have.  All medicines you are taking, including vitamins, herbs, eye drops, creams, and over-the-counter medicines.  Any problems you or family members have had with anesthetic medicines.  Any blood disorders you have.  Any surgeries you have had.  Any medical conditions you have.  Whether you are pregnant or may be pregnant. What are the risks? Generally, this is a safe procedure. However, as with any procedure, problems may occur, including:  Infection.  Bleeding.  Allergic reactions to anesthetic medicines.  Damage to nerves or blood vessels.  Failure of the device to  work. This could require another surgery to replace it. What happens before the procedure?   You may have a physical exam, blood tests, and imaging tests of your heart, such as a chest X-ray.  Follow instructions from your health care provider about eating or drinking restrictions.  Ask your health care provider about:  Changing or stopping your regular medicines. This is especially important if you are taking diabetes medicines or blood thinners.  Taking medicines such as aspirin and ibuprofen. These medicines can thin your blood. Do not take these medicines before your procedure if your surgeon instructs you not to.  Ask your health care provider how your surgical site will be marked or identified.  You may be given antibiotic medicine to help prevent infection.  Plan to have someone take you home after the procedure.  If you will be going home right after the procedure, plan to have someone with you for 24 hours.  Do not use  any tobacco products, such as cigarettes, chewing tobacco, and e-cigarettes as told by your surgeon. If you need help quitting, ask your health care provider. What happens during the procedure?  To reduce your risk of infection:  Your health care team will wash or sanitize their hands.  Your skin will be washed with soap.  An IV tube will be inserted into one of your veins.  You may be given an antibiotic medicine through the IV tube.  You may be given one or more of the following:  A medicine to help you relax (sedative).  A medicine to numb the area (local anesthetic).  A small cut (incision) will be made on the left side of your upper chest.  A pocket will be created under your skin.  The device will be placed in the pocket.  The incision will be closed with stitches (sutures) or adhesive strips.  A bandage (dressing) will be placed over the incision. The procedure may vary among health care providers and hospitals. What happens after the  procedure?  Your blood pressure, heart rate, breathing rate, and blood oxygen level will be monitored often until the medicines you were given have worn off.  You may be able to go home on the day of your surgery. Before going home:  Your health care provider will program your recorder.  You will learn how to trigger your device with a handheld activator.  You will learn how to send recordings to your health care provider.  You will get an ID card for your device, and you will be told when to use it.  Do not drive for 24 hours if you received a sedative. This information is not intended to replace advice given to you by your health care provider. Make sure you discuss any questions you have with your health care provider. Document Released: 02/16/2015 Document Revised: 08/13/2015 Document Reviewed: 12/10/2014  2017 [employer]vier
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
## 2016-04-12 DIAGNOSIS — H903 Sensorineural hearing loss, bilateral: Secondary | ICD-10-CM | POA: Insufficient documentation

## 2016-04-12 DIAGNOSIS — J3801 Paralysis of vocal cords and larynx, unilateral: Secondary | ICD-10-CM | POA: Insufficient documentation

## 2016-09-03 ENCOUNTER — Encounter (HOSPITAL_BASED_OUTPATIENT_CLINIC_OR_DEPARTMENT_OTHER): Payer: Self-pay | Admitting: Emergency Medicine

## 2016-09-03 ENCOUNTER — Emergency Department (HOSPITAL_BASED_OUTPATIENT_CLINIC_OR_DEPARTMENT_OTHER)
Admission: EM | Admit: 2016-09-03 | Discharge: 2016-09-04 | Disposition: A | Discharge status: Home / Self Care | Payer: Medicare HMO | Attending: Emergency Medicine | Admitting: Emergency Medicine

## 2016-09-03 DIAGNOSIS — Z7901 Long term (current) use of anticoagulants: Secondary | ICD-10-CM | POA: Diagnosis not present

## 2016-09-03 DIAGNOSIS — Z79899 Other long term (current) drug therapy: Secondary | ICD-10-CM | POA: Diagnosis not present

## 2016-09-03 DIAGNOSIS — I2510 Atherosclerotic heart disease of native coronary artery without angina pectoris: Secondary | ICD-10-CM | POA: Diagnosis not present

## 2016-09-03 DIAGNOSIS — Z7982 Long term (current) use of aspirin: Secondary | ICD-10-CM | POA: Insufficient documentation

## 2016-09-03 DIAGNOSIS — R040 Epistaxis: Secondary | ICD-10-CM | POA: Diagnosis present

## 2016-09-03 HISTORY — DX: Other pulmonary embolism without acute cor pulmonale: I26.99

## 2016-09-03 HISTORY — DX: Hereditary deficiency of other clotting factors: D68.2

## 2016-09-03 MED ORDER — OXYMETAZOLINE HCL 0.05 % NA SOLN
1.0000 | NASAL | Status: DC | PRN
  Administered 2016-09-03: 1 via NASAL
  Filled 2016-09-03 (×2): qty 15

## 2016-09-03 NOTE — ED Triage Notes (Signed)
PT presents with nose bleed for the past hour. PT sts he could not remember if her took his xeralto so he took half a pill extra and about an hour later started having a nose bleed after blowing his nose. Bleeding controlled in triage with pressure.

## 2016-09-04 ENCOUNTER — Encounter (HOSPITAL_BASED_OUTPATIENT_CLINIC_OR_DEPARTMENT_OTHER): Payer: Self-pay | Admitting: Emergency Medicine

## 2016-09-04 LAB — CBC WITH DIFFERENTIAL/PLATELET
Basophils Absolute: 0 10*3/uL (ref 0.0–0.1)
Basophils Relative: 0 %
Eosinophils Absolute: 0.1 10*3/uL (ref 0.0–0.7)
Eosinophils Relative: 2 %
HEMATOCRIT: 46.5 % (ref 39.0–52.0)
HEMOGLOBIN: 15.5 g/dL (ref 13.0–17.0)
LYMPHS ABS: 1.9 10*3/uL (ref 0.7–4.0)
Lymphocytes Relative: 29 %
MCH: 31.6 pg (ref 26.0–34.0)
MCHC: 33.3 g/dL (ref 30.0–36.0)
MCV: 94.7 fL (ref 78.0–100.0)
MONO ABS: 0.7 10*3/uL (ref 0.1–1.0)
MONOS PCT: 11 %
NEUTROS ABS: 4 10*3/uL (ref 1.7–7.7)
NEUTROS PCT: 58 %
Platelets: 243 10*3/uL (ref 150–400)
RBC: 4.91 MIL/uL (ref 4.22–5.81)
RDW: 14.2 % (ref 11.5–15.5)
WBC: 6.7 10*3/uL (ref 4.0–10.5)

## 2016-09-04 LAB — BASIC METABOLIC PANEL
Anion gap: 9 (ref 5–15)
BUN: 27 mg/dL — AB (ref 6–20)
CHLORIDE: 104 mmol/L (ref 101–111)
CO2: 23 mmol/L (ref 22–32)
CREATININE: 0.93 mg/dL (ref 0.61–1.24)
Calcium: 9.3 mg/dL (ref 8.9–10.3)
GFR calc Af Amer: >60 mL/min (ref >60)
GFR calc non Af Amer: >60 mL/min (ref >60)
GLUCOSE: 127 mg/dL — AB (ref 65–99)
Potassium: 4 mmol/L (ref 3.5–5.1)
Sodium: 136 mmol/L (ref 135–145)

## 2016-09-04 LAB — PROTIME-INR
INR: 2.38
Prothrombin Time: 26.4 seconds — ABNORMAL HIGH (ref 11.4–15.2)

## 2016-09-04 LAB — APTT: aPTT: 46 seconds — ABNORMAL HIGH (ref 24–36)

## 2016-09-04 MED ORDER — LIDOCAINE-EPINEPHRINE (PF) 2 %-1:200000 IJ SOLN
10.0000 mL | Freq: Once | INTRAMUSCULAR | Status: DC
  Filled 2016-09-04: qty 10

## 2016-09-04 MED ORDER — LIDOCAINE VISCOUS 2 % MT SOLN: 5.0000 mL | Freq: Once | OROMUCOSAL | Status: DC

## 2016-09-04 MED ORDER — CEPHALEXIN 500 MG PO CAPS: 500.0000 mg | ORAL_CAPSULE | Freq: Two times a day (BID) | ORAL | 0 refills | Status: DC

## 2016-09-04 MED ORDER — LIDOCAINE VISCOUS 2 % MT SOLN
OROMUCOSAL | Status: AC
  Filled 2016-09-04: qty 15

## 2016-09-04 MED ORDER — PHENYLEPHRINE HCL 10 % OP SOLN
Freq: Once | OPHTHALMIC | Status: DC
  Filled 2016-09-04: qty 10

## 2016-09-04 MED ORDER — "TRANEXAMIC ACID 5% ORAL SOLUTION "
10.0000 mL | Freq: Once | ORAL | Status: DC
  Filled 2016-09-04: qty 10

## 2016-09-04 MED ORDER — SILVER NITRATE-POT NITRATE 75-25 % EX MISC
1.0000 "application " | Freq: Once | CUTANEOUS | Status: DC
  Filled 2016-09-04: qty 1

## 2016-09-04 MED ORDER — CEPHALEXIN 250 MG PO CAPS
500.0000 mg | ORAL_CAPSULE | Freq: Once | ORAL | Status: AC
  Administered 2016-09-04: 500 mg via ORAL
  Filled 2016-09-04: qty 2

## 2016-09-04 NOTE — ED Provider Notes (Signed)
Medical screening examination/treatment/procedure(s) were conducted as a shared visit with non-physician practitioner(s) and myself.  I personally evaluated the patient during the encounter.   EKG Interpretation None       Patient is a 71 year old male with history of factor V Leiden, PE and DVT on Xarelto who presents emergency department with a right nosebleed. States symptoms started after he blew his nose tonight. Bleeding stopped initially after direct pressure but then upon evaluation he began bleeding again in the emergency department. We have attempted lidocaine with epinephrine, viscous lidocaine, Afrin with nasal packing, direct pressure for over 30 minutes x 3 this patient was initially very apprehensive about nasal packing. Ultimately we placed a 7.5 cm posterior Rhino Rocket in his nose but unfortunately he was still having active bleeding into his posterior oropharynx. There was no vessel seen that could've been cauterized anteriorly on initial evaluate patient. Suspect this is a posterior bleed. We'll discuss with ENT for transfer to [facility]. We do not have TXA or double balloon rhino rockets at this facility.  Patient is stable and at this time I do not feel he needs medication for reversal of his Xarelto such as K Centra.   2:00 AM  D/w Dr. Constance Holster with ENT. Appreciate his help. He states he will see the patient at [facility] emergency department.  2:05 AM  D/w Dr. Wyvonnia Dusky in the [facility] emergency department who agrees to accept the patient in transfer. Patient and wife would like to go by private vehicle and I feel this is appropriate given patient is stable and is not hemorrhaging. They will go straight to the emergency department and be seen by Dr. Constance Holster with ENT. We will obtain labs prior to transfer to evaluate for thrombocytopenia, coagulopathy but suspect this is related to patient's Xarelto use.   Procedures .Epistaxis Management Date/Time: 09/04/2016 2:09 AM Performed  by: Nyra Jabs Authorized by: Nyra Jabs   Consent:    Consent obtained:  Verbal   Consent given by:  Patient   Risks discussed:  Bleeding, infection, nasal injury and pain   Alternatives discussed:  Delayed treatment, alternative treatment and observation Anesthesia (see MAR for exact dosages):    Anesthesia method:  None Procedure details:    Treatment site:  R posterior   Treatment method:  Nasal balloon 7.5 cm rhino rocket posterior packing, Afrin, lidocaine with EPI, viscous lidocaine   Treatment complexity:  Extensive   Treatment episode: recurring   Post-procedure details:    Assessment:  No improvement   Patient tolerance of procedure:  Tolerated well, no immediate complications   (including critical care time)   Ward, Delice Bison, DO 09/04/16 0214
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
## 2016-09-04 NOTE — Discharge Instructions (Signed)
Hold your blood thinner until Tuesday if there is no further bleeding. Call Dr. Constance Holster immediately if there is any further bleeding.
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
## 2016-09-04 NOTE — ED Provider Notes (Signed)
Patient arrived to [facility] ER as transfer from [facility]. Patient stable upon arrival. He is a 71 y.o. male who presented to [facility] earlier today for epistaxis, concerned for posterior bleed. ENT was consulted who recommended transfer and Dr. Constance Holster would see patient at [facility]. Dr. Constance Holster of ENT notified of patient arrival and will evaluate patient.   See ENT consultation note for full details. Epistaxis resolved treated with cauterization and packing. Per ENT, hold anticoagulation for 48 hours and follow up with them in the office on Friday. Patient monitored with no further signs of bleeding. Understands return precautions and to call ENT immediately if bleeding returns.    Dartanian Knaggs, Ozella Almond, PA-C 09/04/16 2761    Ezequiel Essex, MD 09/04/16 [phone]

## 2016-09-04 NOTE — ED Notes (Signed)
ED PA at bedside

## 2016-09-09 DIAGNOSIS — R040 Epistaxis: Secondary | ICD-10-CM | POA: Insufficient documentation

## 2017-09-14 ENCOUNTER — Ambulatory Visit (INDEPENDENT_AMBULATORY_CARE_PROVIDER_SITE_OTHER)
Admission: RE | Admit: 2017-09-14 | Discharge: 2017-09-14 | Disposition: A | Discharge status: Home / Self Care | Payer: Medicare HMO | Source: Ambulatory Visit | Attending: Family Medicine | Admitting: Family Medicine

## 2017-09-14 ENCOUNTER — Encounter: Payer: Self-pay | Admitting: Family Medicine

## 2017-09-14 ENCOUNTER — Ambulatory Visit: Payer: Self-pay

## 2017-09-14 ENCOUNTER — Ambulatory Visit: Payer: Medicare HMO | Admitting: Family Medicine

## 2017-09-14 VITALS — BP 130/82 | HR 67 | Ht 70.5 in | Wt 167.0 lb

## 2017-09-14 DIAGNOSIS — G8929 Other chronic pain: Secondary | ICD-10-CM

## 2017-09-14 DIAGNOSIS — M25562 Pain in left knee: Secondary | ICD-10-CM

## 2017-09-14 DIAGNOSIS — M76892 Other specified enthesopathies of left lower limb, excluding foot: Secondary | ICD-10-CM | POA: Diagnosis not present
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
## 2017-09-26 ENCOUNTER — Telehealth: Payer: Self-pay

## 2017-09-26 NOTE — Telephone Encounter (Signed)
Left message for patient as to when his appointment day and time is to see Dr. Tamala Julian.
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
## 2017-10-12 ENCOUNTER — Ambulatory Visit (INDEPENDENT_AMBULATORY_CARE_PROVIDER_SITE_OTHER): Payer: Medicare HMO | Admitting: Family Medicine

## 2017-10-12 ENCOUNTER — Encounter: Payer: Self-pay | Admitting: Family Medicine

## 2017-10-12 VITALS — BP 112/70 | HR 80 | Ht 70.0 in | Wt 174.0 lb

## 2017-10-12 DIAGNOSIS — M76899 Other specified enthesopathies of unspecified lower limb, excluding foot: Secondary | ICD-10-CM | POA: Diagnosis not present

## 2017-10-12 DIAGNOSIS — M76892 Other specified enthesopathies of left lower limb, excluding foot: Secondary | ICD-10-CM | POA: Diagnosis not present

## 2017-10-12 NOTE — Patient Instructions (Addendum)
Good to see you  pennsaid pinkie amount topically 2 times daily as needed.  Ice 20 minutes 2 times daily. Usually after activity and before bed. PT will be calling you   Stay active  Do not flex knee more then 70 degrees on most exercises See me again in 4 weeks

## 2017-10-12 NOTE — Assessment & Plan Note (Signed)
Minimal change at this time.  Possibly worsening.  Topical anti-inflammatories given, discussed compression, patient is going to be referred to formal physical therapy but hopefully will be more beneficial.  Patient has a recent 2 months he is concerned about.

## 2017-10-23 ENCOUNTER — Ambulatory Visit: Payer: Medicare HMO | Attending: Family Medicine | Admitting: Physical Therapy

## 2017-10-23 ENCOUNTER — Encounter: Payer: Self-pay | Admitting: Physical Therapy

## 2017-10-23 ENCOUNTER — Other Ambulatory Visit: Payer: Self-pay

## 2017-10-23 VITALS — BP 140/90 | HR 71

## 2017-10-23 DIAGNOSIS — M6281 Muscle weakness (generalized): Secondary | ICD-10-CM | POA: Diagnosis present

## 2017-10-23 DIAGNOSIS — R29898 Other symptoms and signs involving the musculoskeletal system: Secondary | ICD-10-CM | POA: Diagnosis present

## 2017-10-23 DIAGNOSIS — G8929 Other chronic pain: Secondary | ICD-10-CM | POA: Insufficient documentation

## 2017-10-23 DIAGNOSIS — M25562 Pain in left knee: Secondary | ICD-10-CM | POA: Insufficient documentation
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
## 2017-10-30 ENCOUNTER — Ambulatory Visit: Payer: Medicare HMO | Admitting: Physical Therapy

## 2017-11-15 ENCOUNTER — Ambulatory Visit: Payer: Self-pay | Admitting: Family Medicine

## 2017-12-20 ENCOUNTER — Telehealth: Payer: Self-pay | Admitting: Family Medicine

## 2017-12-20 NOTE — Telephone Encounter (Signed)
Copied from [place] [id number]. Topic: General - Other >> Dec 20, 2017  4:46 PM Marin Olp L wrote: Reason for CRM: Patient wants to know if can get gelsyn 3 injections from Dr. Creig Hines? >> Dec 20, 2017  4:49 PM Waylan Rocher, Lumin L wrote: Also wamts to know cost/copay info for injections and okay to leave vmail.

## 2017-12-21 NOTE — Telephone Encounter (Signed)
Left msg for pt to return call.

## 2017-12-22 NOTE — Telephone Encounter (Signed)
Spoke to pt, advised him we do not cary the Gelsyn injections but we do have the Synvisc & Orthovisc injections. Pt would like for me to check with his insurance first before scheduling an appt.

## 2018-01-22 NOTE — Telephone Encounter (Signed)
Patient is calling and requesting Ria Comment to give him a call in regards to getting injections.   CB# [phone]

## 2018-01-22 NOTE — Telephone Encounter (Signed)
Spoke to pt, advised him he will need to have a steroid injection done first. Pt scheduled an appt with Dr. Tamala Julian 11.13.19 @ 3pm
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
## 2018-01-31 ENCOUNTER — Ambulatory Visit: Payer: Self-pay

## 2018-01-31 ENCOUNTER — Ambulatory Visit (INDEPENDENT_AMBULATORY_CARE_PROVIDER_SITE_OTHER): Payer: Medicare HMO | Admitting: Family Medicine

## 2018-01-31 ENCOUNTER — Encounter: Payer: Self-pay | Admitting: Family Medicine

## 2018-01-31 VITALS — BP 118/88 | Ht 70.0 in | Wt 157.0 lb

## 2018-01-31 DIAGNOSIS — M76892 Other specified enthesopathies of left lower limb, excluding foot: Secondary | ICD-10-CM | POA: Diagnosis not present

## 2018-01-31 DIAGNOSIS — M25562 Pain in left knee: Principal | ICD-10-CM

## 2018-01-31 DIAGNOSIS — G8929 Other chronic pain: Secondary | ICD-10-CM

## 2018-01-31 NOTE — Patient Instructions (Signed)
Good to see you  Ice is yoru friend PT will be calling you and try it 2-3 times a nd see how it goes.  If not perfect in 6 weeks I would like to see you again and try injection to see if the meniscus is playing a role

## 2018-01-31 NOTE — Assessment & Plan Note (Signed)
Significant improvement.  There seems to be no significant underlying arthritic changes is likely contributing.  Patient will restart formal physical therapy that I think will be beneficial and potentially dry needling could be helpful.  Discussed with patient in great length about icing regimen and home exercises as well.  Patient will be able to start increasing activity.  Does have underlying degenerative meniscal tear that could be contributing but could consider an intra-articular injection if no improvement.

## 2018-02-20 ENCOUNTER — Encounter: Payer: Self-pay | Admitting: Physical Therapy

## 2018-02-20 ENCOUNTER — Ambulatory Visit: Payer: Medicare HMO | Admitting: Physical Therapy

## 2018-02-20 ENCOUNTER — Ambulatory Visit: Payer: Medicare HMO | Attending: Family Medicine | Admitting: Physical Therapy

## 2018-02-20 DIAGNOSIS — G8929 Other chronic pain: Secondary | ICD-10-CM

## 2018-02-20 DIAGNOSIS — M6281 Muscle weakness (generalized): Secondary | ICD-10-CM | POA: Diagnosis present

## 2018-02-20 DIAGNOSIS — R29898 Other symptoms and signs involving the musculoskeletal system: Secondary | ICD-10-CM | POA: Insufficient documentation

## 2018-02-20 DIAGNOSIS — M25562 Pain in left knee: Secondary | ICD-10-CM | POA: Diagnosis present

## 2018-02-20 NOTE — Patient Instructions (Signed)
Access Code: [email or web address]  URL: [email or web address]  Date: 02/20/2018  Prepared by: Elsie Ra   Exercises  Sit to Stand without Arm Support - 10 reps - 1-2 sets - 5 hold - 2x daily - 6x weekly  Partial Lunge with Chair - 10 reps - 3 sets - 2x daily - 6x weekly  Sidelying Hip Abduction with Resistance at Ankle - 10-20 reps - 1-2 sets - 5 hold - 2x daily - 6x weekly  Supine Hip Flexion with Resistance - 10-20 reps - 1-2 sets - 2x daily - 6x weekly  Prone Hip Extension with Resistance Loop - 10-20 reps - 1-2 sets - 2x daily - 6x weekly  Standing 3-Way Kick - 15 reps - 1 sets - 2x daily - 6x weekly  Single Leg Stance with Diagonal Forward Reach - 10-20 reps - 1 sets - 2x daily - 6x weekly
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
## 2018-02-26 ENCOUNTER — Ambulatory Visit: Payer: Medicare HMO | Admitting: Physical Therapy

## 2018-02-26 DIAGNOSIS — M25562 Pain in left knee: Secondary | ICD-10-CM | POA: Diagnosis not present

## 2018-02-26 DIAGNOSIS — M6281 Muscle weakness (generalized): Secondary | ICD-10-CM

## 2018-02-26 DIAGNOSIS — G8929 Other chronic pain: Secondary | ICD-10-CM

## 2018-02-26 DIAGNOSIS — R29898 Other symptoms and signs involving the musculoskeletal system: Secondary | ICD-10-CM
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
## 2018-02-26 NOTE — Patient Instructions (Addendum)

## 2018-02-27 ENCOUNTER — Encounter: Payer: Self-pay | Admitting: Cardiovascular Disease

## 2018-02-27 ENCOUNTER — Ambulatory Visit: Payer: Medicare HMO | Admitting: Cardiovascular Disease

## 2018-02-27 DIAGNOSIS — E782 Mixed hyperlipidemia: Secondary | ICD-10-CM | POA: Diagnosis not present

## 2018-02-27 DIAGNOSIS — R0609 Other forms of dyspnea: Secondary | ICD-10-CM

## 2018-02-27 DIAGNOSIS — I119 Hypertensive heart disease without heart failure: Secondary | ICD-10-CM

## 2018-02-27 NOTE — Progress Notes (Signed)
02/27/2018 Nathan Rodriguez   [birth year]  [id number]  Primary Physician [name], MD Primary Cardiologist: Lorretta Harp MD [name], [place]  HPI:  Nathan Rodriguez is a 72 y.o.  thin and fit-appearing married Caucasian male father of one daughter who is a retired Presenter, broadcasting. He was referred by [name] for cardiovascular evaluation because of progressive dyspnea .  I last saw him in the office 08/25/2015.  He has a history of CAD status post rotational atherectomy, PCI and drug eluting stenting 2 by Dr. Ellouise Newer 11/03/2010 of his LAD. He is a Lawyer as noted progressive increasing dyspnea on exertion and decreased exercise tolerance over the last several weeks. This resected include hyperlipidemia not on statin therapy, history of tobacco abuse smoking 5-6 cigarettes a day off and on for the last 40 years.There is a family history of a father who had bypass surgery at age 5. I performed left heart cath on him 08/31/2015 revealing a widely patent stent, no significant CAD with normal LV function.  Right after that he was diagnosed with a DVT and PE was placed on Xarelto.  His symptoms of dyspnea resolved.  He is being sent back now by Danielle Dess, PA-C because of elevated heart rate with exercise and some dyspnea.    Current Meds  Medication Sig  . Cholecalciferol (VITAMIN D-3 PO) Take 1 tablet by mouth daily.  . Coenzyme Q10 (CO Q 10 PO) Take by mouth daily.  . Cyanocobalamin (VITAMIN B-12 PO) Take 1 tablet by mouth daily.  . Glucos-Chondroit-Hyaluron-MSM (GLUCOSAMINE CHONDROITIN JOINT) TABS Take 1 tablet by mouth daily.  . Omega-3 Fatty Acids (OMEGA 3 PO) Take 1 capsule by mouth daily.  Marland Kitchen omeprazole (PRILOSEC) 20 MG capsule Take 20 mg by mouth 3 (three) times a week.   . rivaroxaban (XARELTO) 20 MG TABS tablet Take 20 mg by mouth daily with supper.     No Known Allergies  Social History   Socioeconomic History  . Marital status: Married   Spouse name: Not on file  . Number of children: Not on file  . Years of education: Not on file  . Highest education level: Not on file  Occupational History  . Not on file  Social Needs  . Financial resource strain: Not on file  . Food insecurity:    Worry: Not on file    Inability: Not on file  . Transportation needs:    Medical: Not on file    Non-medical: Not on file  Tobacco Use  . Smoking status: Former Smoker    Packs/day: 0.25    Years: 43.00    Pack years: 10.75    Types: Cigarettes    Last attempt to quit: 03/21/2002    Years since quitting: 15.9  . Smokeless tobacco: Never Used  Substance and Sexual Activity  . Alcohol use: Yes    Comment: rare  . Drug use: No  . Sexual activity: Not on file  Lifestyle  . Physical activity:    Days per week: Not on file    Minutes per session: Not on file  . Stress: Not on file  Relationships  . Social connections:    Talks on phone: Not on file    Gets together: Not on file    Attends religious service: Not on file    Active member of club or organization: Not on file    Attends meetings of clubs or organizations: Not on  file    Relationship status: Not on file  . Intimate partner violence:    Fear of current or ex partner: Not on file    Emotionally abused: Not on file    Physically abused: Not on file    Forced sexual activity: Not on file  Other Topics Concern  . Not on file  Social History Narrative  . Not on file     Review of Systems: General: negative for chills, fever, night sweats or weight changes.  Cardiovascular: negative for chest pain, dyspnea on exertion, edema, orthopnea, palpitations, paroxysmal nocturnal dyspnea or shortness of breath Dermatological: negative for rash Respiratory: negative for cough or wheezing Urologic: negative for hematuria Abdominal: negative for nausea, vomiting, diarrhea, bright red blood per rectum, melena, or hematemesis Neurologic: negative for visual changes, syncope, or  dizziness All other systems reviewed and are otherwise negative except as noted above.    Blood pressure 120/88, pulse 86, height 5' 10.5" (1.791 m), weight 171 lb 3.2 oz (77.7 kg).  General appearance: alert and no distress Neck: no adenopathy, no carotid bruit, no JVD, supple, symmetrical, trachea midline and thyroid not enlarged, symmetric, no tenderness/mass/nodules Lungs: clear to auscultation bilaterally Heart: regular rate and rhythm, S1, S2 normal, no murmur, click, rub or gallop Extremities: extremities normal, atraumatic, no cyanosis or edema Pulses: 2+ and symmetric Skin: Skin color, texture, turgor normal. No rashes or lesions Neurologic: Alert and oriented X 3, normal strength and tone. Normal symmetric reflexes. Normal coordination and gait  EKG sinus rhythm 86 with right bundle branch block.  I personally reviewed this EKG.  ASSESSMENT AND PLAN:   Coronary artery disease History of CAD status post LAD rotational atherectomy, PCI and drug-eluting stenting x2 by Dr. Ellouise Newer 11/03/10.  I recath him 08/31/2015 revealing a widely patent stent with no other significant CAD and normal LV function.  He is complaining of increased heart rate with exercise and some shortness of breath.  I am going to get exercise Myoview stress test to further evaluate.  Hyperlipidemia History of hyperlipidemia intolerant to statin therapy.  He would be a good candidate for Repatha.  Dyspnea on exertion History of dyspnea on exertion with remote tobacco abuse having quit back at age 40.  He did have DVT and PE after I saw him 2 years ago has been on Xarelto.  He does complain of some dyspnea when exercising.  He is a pretty avid cycler.  Hypertensive heart disease History of essential hypertension her blood pressure measured today 120/88.  He is not on antihypertensive medications.      Lorretta Harp MD FACP,FACC,FAHA, [facility] 02/27/2018 11:43 AM

## 2018-02-27 NOTE — Assessment & Plan Note (Signed)
History of hyperlipidemia intolerant to statin therapy.  He would be a good candidate for Repatha.

## 2018-02-27 NOTE — Assessment & Plan Note (Signed)
History of dyspnea on exertion with remote tobacco abuse having quit back at age 72.  He did have DVT and PE after I saw him 2 years ago has been on Xarelto.  He does complain of some dyspnea when exercising.  He is a pretty avid cycler.

## 2018-02-27 NOTE — Assessment & Plan Note (Signed)
History of CAD status post LAD rotational atherectomy, PCI and drug-eluting stenting x2 by Dr. Ellouise Newer 11/03/10.  I recath him 08/31/2015 revealing a widely patent stent with no other significant CAD and normal LV function.  He is complaining of increased heart rate with exercise and some shortness of breath.  I am going to get exercise Myoview stress test to further evaluate.

## 2018-02-27 NOTE — Assessment & Plan Note (Signed)
History of essential hypertension her blood pressure measured today 120/88.  He is not on antihypertensive medications.

## 2018-02-27 NOTE — Patient Instructions (Addendum)
Medication Instructions:  Your physician recommends that you continue on your current medications as directed. Please refer to the Current Medication list given to you today.  If you need a refill on your cardiac medications before your next appointment, please call your pharmacy.   Lab work: NONE If you have labs (blood work) drawn today and your tests are completely normal, you will receive your results only by: Marland Kitchen MyChart Message (if you have MyChart) OR . A paper copy in the mail If you have any lab test that is abnormal or we need to change your treatment, we will call you to review the results.  Testing/Procedures: Your physician has requested that you have an exercise stress myoview. For further information please visit [email or web address]. Please follow instruction sheet, as given.  Follow-Up: At [facility], you and your health needs are our priority.  As part of our continuing mission to provide you with exceptional heart care, we have created designated Provider Care Teams.  These Care Teams include your primary Cardiologist (physician) and Advanced Practice Providers (APPs -  Physician Assistants and Nurse Practitioners) who all work together to provide you with the care you need, when you need it. You will need a follow up appointment in 3 months WITH DR. Gwenlyn Found.    Any Other Special Instructions Will Be Listed Below (If Applicable).  REFERRAL TO Tommy Medal, [facility] FOR LIPIDS SCHEDULE IN January 2020

## 2018-03-02 ENCOUNTER — Telehealth (HOSPITAL_COMMUNITY): Payer: Self-pay

## 2018-03-02 NOTE — Telephone Encounter (Signed)
Encounter complete. 

## 2018-03-05 ENCOUNTER — Ambulatory Visit: Payer: Medicare HMO | Admitting: Physical Therapy

## 2018-03-05 VITALS — BP 142/98 | HR 104
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
## 2018-03-07 ENCOUNTER — Ambulatory Visit (HOSPITAL_COMMUNITY)
Admission: RE | Admit: 2018-03-07 | Discharge: 2018-03-07 | Disposition: A | Discharge status: Home / Self Care | Payer: Medicare HMO | Source: Ambulatory Visit | Attending: Cardiovascular Disease | Admitting: Cardiovascular Disease

## 2018-03-07 DIAGNOSIS — R0609 Other forms of dyspnea: Secondary | ICD-10-CM | POA: Insufficient documentation

## 2018-03-07 LAB — MYOCARDIAL PERFUSION IMAGING
CHL CUP NUCLEAR SRS: 8
CHL CUP NUCLEAR SSS: 10
CSEPED: 10 min
CSEPEDS: 10 s
CSEPEW: 12 METS
CSEPPHR: 171 {beats}/min
LV dias vol: 103 mL (ref 62–150)
LV sys vol: 59 mL
MPHR: 146 {beats}/min
Percent HR: 117 %
RPE: 17
Rest HR: 77 {beats}/min
SDS: 2
TID: 1.04

## 2018-03-07 MED ORDER — TECHNETIUM TC 99M TETROFOSMIN IV KIT
26.0000 | PACK | Freq: Once | INTRAVENOUS | Status: AC | PRN
Start: 2018-03-07 — End: 2018-03-07
  Administered 2018-03-07: 26 via INTRAVENOUS
  Filled 2018-03-07: qty 26

## 2018-03-07 MED ORDER — TECHNETIUM TC 99M TETROFOSMIN IV KIT
7.7000 | PACK | Freq: Once | INTRAVENOUS | Status: AC | PRN
  Administered 2018-03-07: 7.7 via INTRAVENOUS
  Filled 2018-03-07: qty 8

## 2018-03-12 ENCOUNTER — Telehealth: Payer: Self-pay | Admitting: *Deleted

## 2018-03-12 ENCOUNTER — Other Ambulatory Visit: Payer: Self-pay | Admitting: *Deleted

## 2018-03-12 DIAGNOSIS — R0609 Other forms of dyspnea: Principal | ICD-10-CM

## 2018-03-12 DIAGNOSIS — I429 Cardiomyopathy, unspecified: Secondary | ICD-10-CM

## 2018-03-12 NOTE — Telephone Encounter (Addendum)
Left message for pt to call    ----- Message from Lorretta Harp, MD sent at 03/08/2018  7:18 AM EST ----- Please order a 2D echo then ROV

## 2018-03-19 NOTE — Telephone Encounter (Signed)
Echo 03-23-18

## 2018-03-20 ENCOUNTER — Encounter: Payer: Self-pay | Admitting: Physical Therapy

## 2018-03-20 ENCOUNTER — Ambulatory Visit: Payer: Medicare HMO | Admitting: Physical Therapy

## 2018-03-20 VITALS — BP 137/96 | HR 84

## 2018-03-20 DIAGNOSIS — M25562 Pain in left knee: Principal | ICD-10-CM

## 2018-03-20 DIAGNOSIS — G8929 Other chronic pain: Secondary | ICD-10-CM

## 2018-03-20 DIAGNOSIS — M6281 Muscle weakness (generalized): Secondary | ICD-10-CM

## 2018-03-20 DIAGNOSIS — R29898 Other symptoms and signs involving the musculoskeletal system: Secondary | ICD-10-CM
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
## 2018-03-23 ENCOUNTER — Ambulatory Visit (HOSPITAL_COMMUNITY): Payer: Medicare HMO | Attending: Cardiology

## 2018-03-23 ENCOUNTER — Other Ambulatory Visit: Payer: Self-pay

## 2018-03-23 ENCOUNTER — Encounter: Payer: Self-pay | Admitting: *Deleted

## 2018-03-23 DIAGNOSIS — N400 Enlarged prostate without lower urinary tract symptoms: Secondary | ICD-10-CM | POA: Insufficient documentation

## 2018-03-23 DIAGNOSIS — I429 Cardiomyopathy, unspecified: Secondary | ICD-10-CM | POA: Diagnosis present

## 2018-03-23 DIAGNOSIS — R0609 Other forms of dyspnea: Secondary | ICD-10-CM | POA: Insufficient documentation

## 2018-03-23 DIAGNOSIS — Z87891 Personal history of nicotine dependence: Secondary | ICD-10-CM | POA: Insufficient documentation

## 2018-03-23 DIAGNOSIS — M1990 Unspecified osteoarthritis, unspecified site: Secondary | ICD-10-CM | POA: Insufficient documentation

## 2018-03-23 DIAGNOSIS — Z955 Presence of coronary angioplasty implant and graft: Secondary | ICD-10-CM | POA: Insufficient documentation

## 2018-03-29 ENCOUNTER — Ambulatory Visit: Payer: Medicare HMO | Admitting: Cardiology

## 2018-03-29 ENCOUNTER — Encounter: Payer: Self-pay | Admitting: Cardiology

## 2018-03-29 VITALS — BP 124/82 | HR 76 | Ht 71.0 in | Wt 170.0 lb

## 2018-03-29 DIAGNOSIS — G4733 Obstructive sleep apnea (adult) (pediatric): Secondary | ICD-10-CM

## 2018-03-29 DIAGNOSIS — I10 Essential (primary) hypertension: Secondary | ICD-10-CM

## 2018-03-29 DIAGNOSIS — I2510 Atherosclerotic heart disease of native coronary artery without angina pectoris: Secondary | ICD-10-CM | POA: Diagnosis not present

## 2018-03-29 DIAGNOSIS — R002 Palpitations: Secondary | ICD-10-CM
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
## 2018-03-29 NOTE — Patient Instructions (Addendum)
Medication Instructions:  Your physician recommends that you continue on your current medications as directed. Please refer to the Current Medication list given to you today.  * If you need a refill on your cardiac medications before your next appointment, please call your pharmacy.   Labwork: None ordered  Testing/Procedures: Your physician has recommended that you wear a 2 week event monitor. Event monitors are medical devices that record the heart's electrical activity. Doctors most often [place] these monitors to diagnose arrhythmias. Arrhythmias are problems with the speed or rhythm of the heartbeat. The monitor is a small, portable device. You can wear one while you do your normal daily activities. This is usually used to diagnose what is causing palpitations/syncope (passing out).   Follow-Up: To be determined once monitor is reviewed by Dr. Curt Bears.   Thank you for choosing [facility]!!   Trinidad Curet, RN [phone]  Any Other Special Instructions Will Be Listed Below (If Applicable).    Ambulatory Cardiac Monitoring An ambulatory cardiac monitor is a small recording device that is used to detect abnormal heart rhythms (arrhythmias). Most monitors are connected by wires to flat, sticky disks (electrodes) that are then attached to your chest. You may need to wear a monitor if you have had symptoms such as:  Fast heartbeats (palpitations).  Dizziness.  Fainting or light-headedness.  Unexplained weakness.  Shortness of breath. There are several types of monitors. Some common monitors include:  Holter monitor. This records your heart rhythm continuously, usually for 24-48 hours.  Event (episodic) monitor. This monitor has a symptoms button, and when pushed, it will begin recording. You need to activate this monitor to record when you have a heart-related symptom.  Automatic detection monitor. This monitor will begin recording when it detects an abnormal  heartbeat. What are the risks? Generally, these devices are safe to use. However, it is possible that the skin under the electrodes will become irritated. How to prepare for monitoring Your health care provider will prepare your chest for the electrode placement and show you how to use the monitor.  Do not apply lotions to your chest before monitoring.  Follow directions on how to care for the monitor, and how to return the monitor when the testing period is complete. How to use your cardiac monitor  Follow directions about how long to wear the monitor, and if you can take the monitor off in order to shower or bathe. ? Do not let the monitor get wet. ? Do not bathe, swim, or use a hot tub while wearing the monitor.  Keep your skin clean. Do not put body lotion or moisturizer on your chest.  Change the electrodes as told by your health care provider, or any time they stop sticking to your skin. You may need to use medical tape to keep them on.  Try to put the electrodes in slightly different places on your chest to help prevent skin irritation. Follow directions from your health care provider about where to place the electrodes.  Make sure the monitor is safely clipped to your clothing or in a location close to your body as recommended by your health care provider.  If your monitor has a symptoms button, press the button to mark an event as soon as you feel a heart-related symptom, such as: ? Dizziness. ? Weakness. ? Light-headedness. ? Palpitations. ? Thumping or pounding in your chest. ? Shortness of breath. ? Unexplained weakness.  Keep a diary of your activities, such as walking,  doing chores, and taking medicine. It is very important to note what you were doing when you pushed the button to record your symptoms. This will help your health care provider determine what might be contributing to your symptoms.  Send the recorded information as recommended by your health care  provider. It may take some time for your health care provider to process the results.  Change the batteries as told by your health care provider.  Keep electronic devices away from your monitor. These include: ? Tablets. ? MP3 players. ? Cell phones.  While wearing your monitor you should avoid: ? Electric blankets. ? Armed forces operational officer. ? Electric toothbrushes. ? Microwave ovens. ? Magnets. ? Metal detectors. Get help right away if:  You have chest pain.  You have shortness of breath or extreme difficulty breathing.  You develop a very fast heartbeat that does not get better.  You develop dizziness that does not go away.  You faint or constantly feel like you are about to faint. Summary  An ambulatory cardiac monitor is a small recording device that is used to detect abnormal heart rhythms (arrhythmias).  Make sure you understand how to send the information from the monitor to your health care provider.  It is important to press the button on the monitor when you have any heart-related symptoms.  Keep a diary of your activities, such as walking, doing chores, and taking medicine. It is very important to note what you were doing when you pushed the button to record your symptoms. This will help your health care provider learn what might be causing your symptoms. This information is not intended to replace advice given to you by your health care provider. Make sure you discuss any questions you have with your health care provider. Document Released: 12/15/2007 Document Revised: 12/21/2016 Document Reviewed: 02/20/2016 Elsevier Interactive Patient Education  2019 [employer].

## 2018-04-03 ENCOUNTER — Ambulatory Visit: Payer: Medicare HMO | Admitting: Pharmacist

## 2018-04-03 VITALS — BP 130/88 | HR 62 | Resp 16 | Ht 70.0 in | Wt 169.0 lb

## 2018-04-03 DIAGNOSIS — E782 Mixed hyperlipidemia: Secondary | ICD-10-CM

## 2018-04-03 MED ORDER — ROSUVASTATIN CALCIUM 10 MG PO TABS: ORAL_TABLET | ORAL | 1 refills | Status: DC

## 2018-04-03 NOTE — Progress Notes (Signed)
Patient ID: [name]                 DOB: 22-Jul-1945                    MRN: [id number]     HPI: Nathan Rodriguez is a 73 y.o. male patient referred to lipid clinic by Dr Gwenlyn Found. PMH is significant for CAD s/p stent placement in 10/2010, hypertension, palpitations, sleep apnea, and hyperlipidemia.  Avid cyclist running 16 miles per day.  Patient reports intolerance to statins with low extremity pain while using simvastatin and pravastatin. He will prefers not injectable products at this time but willing to get information about PCSK9i.   Current Medications: none  Intolerances:  Simvastatin 20mg  daily Pravastatin 40mg  daily - lethargic, decreased energy, muscle aches and tightness   LDL goal: < 70mg /mL  Diet: balanced diet  Exercise: usual cycline 30 miles per day, now down to 16 miles  Family History: The patient's family history includes Alzheimer's disease in his mother; Breast cancer in his mother; Clotting disorder in his brother; Diabetes in his father; Factor V Leiden deficiency in his brother and father; Heart disease in his father; Heart failure in his father.   Social History: The patient  reports that he quit smoking about 16 years ago. His smoking use included cigarettes. He has a 10.75 pack-year smoking history. He has never used smokeless tobacco. He reports current alcohol use. He reports that he does not use drugs.   Labs: at Dr Tamala Julian PCP - no copy in Epic or [facility]  Past Medical History:  Diagnosis Date  . Arthritis   . CAD (coronary artery disease)   . Complication of anesthesia    hard time to wake up  . Coronary artery disease    a. 2012  s/p prior DES x 2 to the LAD;  b. 05/2015 nl stress test [facility]);  c. 6/20217 Cath: LM nl, LAD patent stents, LCX/RCA nl, EF 55-65%.  . DDD (degenerative disc disease)   . Dyspnea on exertion    a. has been seen by pulmonology - reportedly told that everything nl.  . Factor V deficiency ([place])   . GERD  (gastroesophageal reflux disease)   . Hypercholesteremia   . Hypertensive heart disease   . Nocturia   . Occipital headache    sight changes without migraines, for over 30  years  . Pulmonary embolism ([place])   . Scoliosis   . Sleep apnea    mild, CPAP  . Tinnitus     Current Outpatient Medications on File Prior to Visit  Medication Sig Dispense Refill  . Cholecalciferol (VITAMIN D-3 PO) Take 1 tablet by mouth daily.    . Coenzyme Q10 (CO Q 10 PO) Take by mouth daily.    . Cyanocobalamin (VITAMIN B-12 PO) Take 1 tablet by mouth daily.    . Glucos-Chondroit-Hyaluron-MSM (GLUCOSAMINE CHONDROITIN JOINT) TABS Take 1 tablet by mouth daily.    Marland Kitchen MELATONIN PO Take 1 tablet by mouth at bedtime as needed.    . Omega-3 Fatty Acids (OMEGA 3 PO) Take 1 capsule by mouth daily.    Marland Kitchen omeprazole (PRILOSEC) 20 MG capsule Take 20 mg by mouth 3 (three) times a week.     . rivaroxaban (XARELTO) 20 MG TABS tablet Take 20 mg by mouth daily with supper.    . TURMERIC PO Take 1 tablet by mouth daily.     Marland Kitchen ibuprofen (ADVIL,MOTRIN) 200 MG tablet  Take 200 mg by mouth daily as needed for headache or moderate pain.     No current facility-administered medications on file prior to visit.     No Known Allergies  Hyperlipidemia LDL above goal for secondary prevention and patient unable to tolerate daily dose statins. Never tried low dose statin in the past and will like this approach before going to injectable medication. We discussed indication, dosage and administration for Repatha/Praleunt ,but due to preference will try low dose rosuvastatin 1st.  Will start rosuvastatin 10mg  every Monday and Friday. Plan to repeat lipid panel in 2 months and re-assess therapy. May add ezetimibe 10mg  daily to improve response if needed. Plan to re-visit PCSK9i option if low dose statin plus ezetimibe cause ADR or lack proper response.    Liridona Mashaw Rodriguez-Guzman PharmD, BCPS, [place] [id number] 04/04/2018 12:38 PM

## 2018-04-03 NOTE — Patient Instructions (Addendum)
Lipid Clinic (pharmacist) Marwan Lipe/Kristin [phone]  *START taking rosuvastatin 10mg  every Monday and Friday ONLY* *REPEAT fasting blood work in 2 months*   Cholesterol  Cholesterol is a fat. Your body needs a small amount of cholesterol. Cholesterol (plaque) may build up in your blood vessels (arteries). That makes you more likely to have a heart attack or stroke. You cannot feel your cholesterol level. Having a blood test is the only way to find out if your level is high. Keep your test results. Work with your doctor to keep your cholesterol at a good level. What do the results mean?  Total cholesterol is how much cholesterol is in your blood.  LDL is bad cholesterol. This is the type that can build up. Try to have low LDL.  HDL is good cholesterol. It cleans your blood vessels and carries LDL away. Try to have high HDL.  Triglycerides are fat that the body can store or burn for energy. What are good levels of cholesterol?  Total cholesterol below 200.  LDL below 100 is good for people who have health risks. LDL below 70 is good for people who have very high risks.  HDL above 40 is good. It is best to have HDL of 60 or higher.  Triglycerides below 150. How can I lower my cholesterol? Diet Follow your diet program as told by your doctor.  Choose fish, white meat chicken, or [place] that is roasted or baked. Try not to eat red meat, fried foods, sausage, or lunch meats.  Eat lots of fresh fruits and vegetables.  Choose whole grains, beans, pasta, potatoes, and cereals.  Choose olive oil, corn oil, or canola oil. Only use small amounts.  Try not to eat butter, mayonnaise, shortening, or palm kernel oils.  Try not to eat foods with trans fats.  Choose low-fat or nonfat dairy foods. ? Drink skim or nonfat milk. ? Eat low-fat or nonfat yogurt and cheeses. ? Try not to drink whole milk or cream. ? Try not to eat ice cream, egg yolks, or full-fat cheeses.  Healthy  desserts include angel food cake, ginger snaps, animal crackers, hard candy, popsicles, and low-fat or nonfat frozen yogurt. Try not to eat pastries, cakes, pies, and cookies.  Exercise Follow your exercise program as told by your doctor.  Be more active. Try gardening, walking, and taking the stairs.  Ask your doctor about ways that you can be more active. Medicine  Take over-the-counter and prescription medicines only as told by your doctor. This information is not intended to replace advice given to you by your health care provider. Make sure you discuss any questions you have with your health care provider. Document Released: 06/03/2008 Document Revised: 10/07/2015 Document Reviewed: 09/17/2015 Elsevier Interactive Patient Education  2019 [employer].

## 2018-04-04 ENCOUNTER — Encounter: Payer: Self-pay | Admitting: Pharmacist

## 2018-04-04 NOTE — Assessment & Plan Note (Signed)
LDL above goal for secondary prevention and patient unable to tolerate daily dose statins. Never tried low dose statin in the past and will like this approach before going to injectable medication. We discussed indication, dosage and administration for Repatha/Praleunt ,but due to preference will try low dose rosuvastatin 1st.  Will start rosuvastatin 10mg  every Monday and Friday. Plan to repeat lipid panel in 2 months and re-assess therapy. May add ezetimibe 10mg  daily to improve response if needed. Plan to re-visit PCSK9i option if low dose statin plus ezetimibe cause ADR or lack proper response.

## 2018-04-05 ENCOUNTER — Ambulatory Visit (INDEPENDENT_AMBULATORY_CARE_PROVIDER_SITE_OTHER): Payer: Medicare HMO

## 2018-04-05 DIAGNOSIS — R002 Palpitations: Secondary | ICD-10-CM | POA: Diagnosis not present

## 2018-04-17 ENCOUNTER — Other Ambulatory Visit: Payer: Self-pay

## 2018-04-17 ENCOUNTER — Encounter (HOSPITAL_BASED_OUTPATIENT_CLINIC_OR_DEPARTMENT_OTHER): Payer: Self-pay | Admitting: *Deleted

## 2018-04-17 ENCOUNTER — Emergency Department (HOSPITAL_BASED_OUTPATIENT_CLINIC_OR_DEPARTMENT_OTHER): Payer: Medicare HMO

## 2018-04-17 ENCOUNTER — Emergency Department (HOSPITAL_BASED_OUTPATIENT_CLINIC_OR_DEPARTMENT_OTHER)
Admission: EM | Admit: 2018-04-17 | Discharge: 2018-04-18 | Disposition: A | Discharge status: Home / Self Care | Payer: Medicare HMO | Attending: Emergency Medicine | Admitting: Emergency Medicine

## 2018-04-17 DIAGNOSIS — Z955 Presence of coronary angioplasty implant and graft: Secondary | ICD-10-CM | POA: Insufficient documentation

## 2018-04-17 DIAGNOSIS — Z87891 Personal history of nicotine dependence: Secondary | ICD-10-CM | POA: Insufficient documentation

## 2018-04-17 DIAGNOSIS — R002 Palpitations: Secondary | ICD-10-CM | POA: Diagnosis not present

## 2018-04-17 DIAGNOSIS — I10 Essential (primary) hypertension: Secondary | ICD-10-CM | POA: Insufficient documentation

## 2018-04-17 DIAGNOSIS — R000 Tachycardia, unspecified: Secondary | ICD-10-CM | POA: Diagnosis present

## 2018-04-17 DIAGNOSIS — Z79899 Other long term (current) drug therapy: Secondary | ICD-10-CM | POA: Insufficient documentation

## 2018-04-17 DIAGNOSIS — I2510 Atherosclerotic heart disease of native coronary artery without angina pectoris: Secondary | ICD-10-CM | POA: Insufficient documentation

## 2018-04-17 LAB — CBC
HCT: 50.9 % (ref 39.0–52.0)
Hemoglobin: 16.3 g/dL (ref 13.0–17.0)
MCH: 30.9 pg (ref 26.0–34.0)
MCHC: 32 g/dL (ref 30.0–36.0)
MCV: 96.4 fL (ref 80.0–100.0)
Platelets: 281 10*3/uL (ref 150–400)
RBC: 5.28 MIL/uL (ref 4.22–5.81)
RDW: 12.9 % (ref 11.5–15.5)
WBC: 7.2 10*3/uL (ref 4.0–10.5)
nRBC: 0 % (ref 0.0–0.2)

## 2018-04-17 NOTE — ED Triage Notes (Signed)
Pt reports feelings of rapid HR all day. Pt appears anxious. Reports that he recorded his HR at home and the highest was 112.  Pt has monitor in place for a-fib.  States that his resting HR should be 60.  Pt denies pain. Reports some SOB.
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
## 2018-04-18 ENCOUNTER — Ambulatory Visit: Payer: Medicare HMO | Admitting: Family Medicine

## 2018-04-18 ENCOUNTER — Emergency Department (HOSPITAL_BASED_OUTPATIENT_CLINIC_OR_DEPARTMENT_OTHER): Payer: Medicare HMO

## 2018-04-18 ENCOUNTER — Ambulatory Visit (INDEPENDENT_AMBULATORY_CARE_PROVIDER_SITE_OTHER)
Admission: RE | Admit: 2018-04-18 | Discharge: 2018-04-18 | Disposition: A | Discharge status: Home / Self Care | Payer: Medicare HMO | Source: Ambulatory Visit | Attending: Family Medicine | Admitting: Family Medicine

## 2018-04-18 ENCOUNTER — Encounter: Payer: Self-pay | Admitting: Family Medicine

## 2018-04-18 VITALS — BP 132/100 | HR 94 | Ht 70.0 in | Wt 169.0 lb

## 2018-04-18 DIAGNOSIS — M19011 Primary osteoarthritis, right shoulder: Secondary | ICD-10-CM | POA: Diagnosis not present

## 2018-04-18 DIAGNOSIS — M5136 Other intervertebral disc degeneration, lumbar region: Secondary | ICD-10-CM | POA: Diagnosis not present

## 2018-04-18 DIAGNOSIS — M25511 Pain in right shoulder: Secondary | ICD-10-CM

## 2018-04-18 DIAGNOSIS — M545 Low back pain, unspecified: Secondary | ICD-10-CM

## 2018-04-18 DIAGNOSIS — G8929 Other chronic pain: Secondary | ICD-10-CM

## 2018-04-18 LAB — BASIC METABOLIC PANEL
Anion gap: 8 (ref 5–15)
BUN: 23 mg/dL (ref 8–23)
CO2: 26 mmol/L (ref 22–32)
Calcium: 9.6 mg/dL (ref 8.9–10.3)
Chloride: 102 mmol/L (ref 98–111)
Creatinine, Ser: 1.04 mg/dL (ref 0.61–1.24)
GFR calc Af Amer: >60 mL/min (ref >60)
GFR calc non Af Amer: >60 mL/min (ref >60)
Glucose, Bld: 152 mg/dL — ABNORMAL HIGH (ref 70–99)
Potassium: 3.7 mmol/L (ref 3.5–5.1)
SODIUM: 136 mmol/L (ref 135–145)

## 2018-04-18 MED ORDER — IOPAMIDOL (ISOVUE-370) INJECTION 76%
100.0000 mL | Freq: Once | INTRAVENOUS | Status: AC | PRN
  Administered 2018-04-18: 100 mL via INTRAVENOUS

## 2018-04-18 MED ORDER — GABAPENTIN 100 MG PO CAPS: 200.0000 mg | ORAL_CAPSULE | Freq: Every day | ORAL | 3 refills | Status: DC

## 2018-04-18 NOTE — ED Notes (Signed)
PT states understanding of care given, follow up care. PT ambulated from ED to car with a steady gait.  

## 2018-04-18 NOTE — Assessment & Plan Note (Signed)
Patient given injection today.  Tolerated procedure well.  Discussed icing regimen and home exercises.  Do believe that there is some significant amount of arthritis and possible laboratory work-up will be necessary in the future.  Follow-up again in 4 to 8 weeks

## 2018-04-18 NOTE — Assessment & Plan Note (Signed)
Likely severe arthritis.  Patient though has compensated does not have any significant weakness.  Discussed with patient in great length about icing regimen, discussed possible injections if needed in the future.  X-rays ordered.  Home exercises given for range of motion.  Follow-up again in 4 to 6 weeks
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
## 2018-04-18 NOTE — Discharge Instructions (Addendum)
Follow-up with your cardiologist in the next 2 to 3 days, and return to the ER if symptoms significantly worsen or change.

## 2018-04-18 NOTE — Patient Instructions (Addendum)
Good to see you  I would get an xray downstairs today  Exercises 3 times a week.  Stay active  Xrays downstairs You should do well  See me again in 4 weeks

## 2018-04-25 ENCOUNTER — Telehealth: Payer: Self-pay

## 2018-04-25 MED ORDER — METOPROLOL TARTRATE 25 MG PO TABS: 25.0000 mg | ORAL_TABLET | Freq: Two times a day (BID) | ORAL | 3 refills | Status: DC

## 2018-04-25 NOTE — Telephone Encounter (Signed)
-----   Message from Will Meredith Leeds, MD sent at 04/25/2018 10:16 AM EST ----- Tachycardia noted in the 150s.  Appears to be either sinus tachycardia or atrial tachycardia.  Will start metoprolol 25 mg twice a day.

## 2018-04-25 NOTE — Telephone Encounter (Signed)
Pt verbalized understanding of his Monitor results and will start Metoprolol and will keep f/u with Dr. Gwenlyn Found in 05/2018.

## 2018-05-01 NOTE — Progress Notes (Signed)
Left message on 04/27/2018. Will call patient back.
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
## 2018-05-07 ENCOUNTER — Telehealth: Payer: Self-pay

## 2018-05-07 ENCOUNTER — Encounter: Payer: Self-pay | Admitting: Family Medicine

## 2018-05-07 ENCOUNTER — Ambulatory Visit: Payer: Medicare HMO | Admitting: Family Medicine

## 2018-05-07 VITALS — BP 124/78 | HR 59 | Ht 70.0 in | Wt 168.0 lb

## 2018-05-07 DIAGNOSIS — R9389 Abnormal findings on diagnostic imaging of other specified body structures: Secondary | ICD-10-CM

## 2018-05-07 DIAGNOSIS — M5136 Other intervertebral disc degeneration, lumbar region: Secondary | ICD-10-CM | POA: Diagnosis not present

## 2018-05-07 DIAGNOSIS — M25562 Pain in left knee: Secondary | ICD-10-CM

## 2018-05-07 MED ORDER — PREDNISONE 20 MG PO TABS: 20.0000 mg | ORAL_TABLET | Freq: Every day | ORAL | 0 refills | Status: AC

## 2018-05-07 NOTE — Assessment & Plan Note (Signed)
Severe.  Discussed advanced imaging and epidurals which patient declined at the moment.  Prednisone given secondary to patient leaving and I think this will be beneficial.  Patient also had abnormal CT scan of the lungs and encouraged patient to follow-up with pulmonary as well for further evaluation for the groundglass opacity.  Patient will do this.  Declined any type of antibiotic.  No fevers or chills at this time.

## 2018-05-07 NOTE — Patient Instructions (Signed)
God to see you  Ice is your friend Prednisone daily for 1 week to help with inflammation overall  If you do not like the vitamins lets stop 1 a week starting with the glucosamine.  If pain worsens you know it was helping.  Then do the turmeric  I think the beta blocker will be helpful but I also want you to see pulmonary to follow up on the CT scan  See me again in 4 weeks

## 2018-05-07 NOTE — Telephone Encounter (Signed)
Not taking statin at this time. PCP stated him on gabapentin 200mg  qHS and prednisone tapper.    Pt instructed to HOLD tumeric and Co Q 10 for now. Continue all other medication as prescribed.

## 2018-05-07 NOTE — Assessment & Plan Note (Signed)
Partial meniscus tear.  On exam today fairly unremarkable.  Already getting steroids secondary to patient's back pain.  We will hold on any injection today.  We discussed that that may be more beneficial in the long run or advanced imaging may be warranted.  Patient is encouraged by this.  Follow-up again when patient gets back into town in 4 weeks.

## 2018-05-07 NOTE — Telephone Encounter (Signed)
Wishes to speak with Nathan Rodriguez regarding meds crestor bid causing myalgia. Pt stopped taking the statin to see if the pain stopped. [facility] gabapentin 100mg  @ [facility]. Got results back from holter monitor and was prescribed a beta blocker bid. Pt would like a call back later in the afternoon after 3pm

## 2018-05-07 NOTE — Assessment & Plan Note (Signed)
Referred to pulmonary

## 2018-05-23 ENCOUNTER — Ambulatory Visit: Payer: Self-pay | Admitting: Family Medicine

## 2018-05-31 ENCOUNTER — Telehealth: Payer: Self-pay

## 2018-05-31 NOTE — Telephone Encounter (Signed)
Received call from patient stating he has already made an appt with a pulmonologist of his choice and he no longer needs this appt. Nothing further is needed at this time.
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
## 2018-06-04 ENCOUNTER — Ambulatory Visit: Payer: Self-pay | Admitting: Family Medicine

## 2018-06-05 ENCOUNTER — Ambulatory Visit: Payer: Medicare HMO | Admitting: Cardiovascular Disease

## 2018-06-12 ENCOUNTER — Ambulatory Visit: Payer: Self-pay

## 2018-06-12 ENCOUNTER — Ambulatory Visit: Payer: Medicare HMO | Admitting: Family Medicine

## 2018-06-12 ENCOUNTER — Other Ambulatory Visit: Payer: Self-pay

## 2018-06-12 ENCOUNTER — Encounter: Payer: Self-pay | Admitting: Family Medicine

## 2018-06-12 VITALS — BP 122/84 | HR 57 | Ht 70.0 in | Wt 168.0 lb

## 2018-06-12 DIAGNOSIS — M533 Sacrococcygeal disorders, not elsewhere classified: Secondary | ICD-10-CM | POA: Diagnosis not present

## 2018-06-12 DIAGNOSIS — M25552 Pain in left hip: Secondary | ICD-10-CM

## 2018-06-12 NOTE — Assessment & Plan Note (Signed)
Sacroiliac dysfunction, I do think that lumbar radiculopathy is within the differential and that still is a concern but unfortunately secondary to coronavirus advanced imaging is not possible.  Attempted injection and did have some improvement almost immediately.  Encourage patient to take 48 hours and then restart exercises.  Topical anti-inflammatories encouraged but avoid oral anti-inflammatories secondary to chronic blood thinner.  Discussed icing regimen.  Home exercises went over in detail and patient will follow-up again in 6 to 8 weeks

## 2018-06-12 NOTE — Patient Instructions (Addendum)
Good to see you  Ice 20 minutes 2 times daily. Usually after activity and before bed. Injected SI joint as well  Stay active If not better will need to consider MRI but will be some time See me again in 6 weeks
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
## 2018-06-20 ENCOUNTER — Ambulatory Visit: Payer: Self-pay | Admitting: Family Medicine

## 2018-06-22 ENCOUNTER — Telehealth: Payer: Self-pay

## 2018-06-22 NOTE — Telephone Encounter (Signed)
Spoke with pt wife and made aware of new appt time at 1:00 on 4/7 via virtual visit. She states she will make him aware

## 2018-06-25 ENCOUNTER — Telehealth: Payer: Self-pay | Admitting: Pharmacist

## 2018-06-25 NOTE — Telephone Encounter (Signed)
Patient agreed on starting Repatha 140mg  [place] every 14 days.  Will request recent blood work from PCP, and submit paperwork  For PA as soon as possible.

## 2018-06-25 NOTE — Telephone Encounter (Addendum)
   Cardiac Questionnaire:    Since your last visit or hospitalization:    1. Have you been having new or worsening chest pain? NO   2. Have you been having new or worsening shortness of breath? NO 3. Have you been having new or worsening leg swelling, wt gain, or increase in abdominal girth (pants fitting more tightly)? NO   4. Have you had any passing out spells? NO    *A YES to any of these questions would result in the appointment being kept. *If all the answers to these questions are NO, we should indicate that given the current situation regarding the worldwide coronarvirus pandemic, at the recommendation of the CDC, we are looking to limit gatherings in our waiting area, and thus will reschedule their appointment beyond four weeks from today.   _____________   [id number] Pre-Screening Questions:  . Do you currently have a fever?NO . Have you recently travelled on a cruise, internationally, or to [place], [place], [place], [place], [place], or [place], [place] [employer])?NO . Have you been in contact with someone that is currently pending confirmation of Covid19 testing or has been confirmed to have the [place] virus? NO Are you currently experiencing fatigue or cough? NO    Patient has concerns that his Metoprolol Tartrate has been slowing his heart rate a little too much. Patient also has been seeing a pulmonologist from [facility].

## 2018-06-25 NOTE — Addendum Note (Signed)
Addended by: Venetia Maxon on: 06/25/2018 01:58 PM   Modules accepted: Orders

## 2018-06-26 ENCOUNTER — Telehealth (INDEPENDENT_AMBULATORY_CARE_PROVIDER_SITE_OTHER): Payer: Medicare HMO | Admitting: Cardiovascular Disease

## 2018-06-26 ENCOUNTER — Telehealth: Payer: Self-pay

## 2018-06-26 ENCOUNTER — Other Ambulatory Visit: Payer: Self-pay

## 2018-06-26 VITALS — BP 143/79 | HR 62 | Wt 160.0 lb

## 2018-06-26 DIAGNOSIS — Z86711 Personal history of pulmonary embolism: Secondary | ICD-10-CM

## 2018-06-26 DIAGNOSIS — I10 Essential (primary) hypertension: Secondary | ICD-10-CM

## 2018-06-26 DIAGNOSIS — I2510 Atherosclerotic heart disease of native coronary artery without angina pectoris: Secondary | ICD-10-CM

## 2018-06-26 DIAGNOSIS — E785 Hyperlipidemia, unspecified: Secondary | ICD-10-CM | POA: Diagnosis not present

## 2018-06-26 DIAGNOSIS — Z79899 Other long term (current) drug therapy: Secondary | ICD-10-CM

## 2018-06-26 DIAGNOSIS — Z7901 Long term (current) use of anticoagulants: Secondary | ICD-10-CM

## 2018-06-26 DIAGNOSIS — R069 Unspecified abnormalities of breathing: Secondary | ICD-10-CM

## 2018-06-26 NOTE — Telephone Encounter (Signed)
Patient and/or DPR-approved person aware of AVS instructions and verbalized understanding. 
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
## 2018-06-26 NOTE — Patient Instructions (Signed)
Medication Instructions:  Your physician recommends that you continue on your current medications as directed. Please refer to the Current Medication list given to you today.  If you need a refill on your cardiac medications before your next appointment, please call your pharmacy.   Lab work: Your physician recommends that you have your blood drawn by your Primary Care Provider's (PCP) office if possible. You will need the following thyroid function tests: TSH and Free T4.   If your PCP's office is unable to collect this blood work, you may present for lab work in our office.  If you have labs (blood work) drawn today and your tests are completely normal, you will receive your results only by: Marland Kitchen MyChart Message (if you have MyChart) OR . A paper copy in the mail If you have any lab test that is abnormal or we need to change your treatment, we will call you to review the results.  Testing/Procedures: NONE  Follow-Up: At [facility], you and your health needs are our priority.  As part of our continuing mission to provide you with exceptional heart care, we have created designated Provider Care Teams.  These Care Teams include your primary Cardiologist (physician) and Advanced Practice Providers (APPs -  Physician Assistants and Nurse Practitioners) who all work together to provide you with the care you need, when you need it. You will need a follow up appointment in 3 months.  Please call our office 2 months in advance to schedule this appointment.    Any Other Special Instructions Will Be Listed Below (If Applicable). Our clinical pharmacists are working on getting you approved to start Clarke, a medication that helps lower your "bad" cholesterol.

## 2018-06-26 NOTE — Telephone Encounter (Signed)
Virtual Visit Pre-Appointment Phone Call  Steps For Call:  1. Confirm consent - "In the setting of the current Covid19 crisis, you are scheduled for a (phone or video) visit with your provider on (date) at (time).  Just as we do with many in-office visits, in order for you to participate in this visit, we must obtain consent.  If you'd like, I can send this to your mychart (if signed up) or email for you to review.  Otherwise, I can obtain your verbal consent now.  All virtual visits are billed to your insurance company just like a normal visit would be.  By agreeing to a virtual visit, we'd like you to understand that the technology does not allow for your provider to perform an examination, and thus may limit your provider's ability to fully assess your condition.  Finally, though the technology is pretty good, we cannot assure that it will always work on either your or our end, and in the setting of a video visit, we may have to convert it to a phone-only visit.  In either situation, we cannot ensure that we have a secure connection.  Are you willing to proceed?"  2. Give patient instructions for WebEx download to smartphone as below if video visit  3. Advise patient to be prepared with any vital sign or heart rhythm information, their current medicines, and a piece of paper and pen handy for any instructions they may receive the day of their visit  4. Inform patient they will receive a phone call 15 minutes prior to their appointment time (may be from unknown caller ID) so they should be prepared to answer  5. Confirm that appointment type is correct in Epic appointment notes (video vs telephone)    TELEPHONE CALL NOTE  Nathan Rodriguez has been deemed a candidate for a follow-up tele-health visit to limit community exposure during the Covid-19 pandemic. I spoke with the patient via phone to ensure availability of phone/video source, confirm preferred email & phone number, and discuss  instructions and expectations.  I reminded Nathan Rodriguez to be prepared with any vital sign and/or heart rhythm information that could potentially be obtained via home monitoring, at the time of his visit. I reminded Nathan Rodriguez to expect a phone call at the time of his visit if his visit.  Did the patient verbally acknowledge consent to treatment? YES  Nathan Rodriguez Dawna Part, RN 06/26/2018    DOWNLOADING THE [place], go to App Store and type in WebEx in the search bar. [employer], the blue/green circle. The app is free but as with any other app downloads, their phone may require them to verify saved payment information or Apple password. The patient does NOT have to create an account.  - If Android, ask patient to go to [employer] and type in WebEx in the search bar. [place] [employer], the blue/green circle. The app is free but as with any other app downloads, their phone may require them to verify saved payment information or Android password. The patient does NOT have to create an account.   CONSENT FOR TELE-HEALTH VISIT - PLEASE REVIEW  I hereby voluntarily request, consent and authorize [facility] and its employed or contracted physicians, physician assistants, nurse practitioners or other licensed health care professionals (the Practitioner), to provide me with telemedicine health care services (the "Services") as deemed necessary by the treating Practitioner. I  acknowledge and consent to receive the Services by the Practitioner via telemedicine. I understand that the telemedicine visit will involve communicating with the Practitioner through live audiovisual communication technology and the disclosure of certain medical information by electronic transmission. I acknowledge that I have been given the opportunity to request an in-person assessment or other available alternative prior to the telemedicine visit and am  voluntarily participating in the telemedicine visit.  I understand that I have the right to withhold or withdraw my consent to the use of telemedicine in the course of my care at any time, without affecting my right to future care or treatment, and that the Practitioner or I may terminate the telemedicine visit at any time. I understand that I have the right to inspect all information obtained and/or recorded in the course of the telemedicine visit and may receive copies of available information for a reasonable fee.  I understand that some of the potential risks of receiving the Services via telemedicine include:  Marland Kitchen Delay or interruption in medical evaluation due to technological equipment failure or disruption; . Information transmitted may not be sufficient (e.g. poor resolution of images) to allow for appropriate medical decision making by the Practitioner; and/or  . In rare instances, security protocols could fail, causing a breach of personal health information.  Furthermore, I acknowledge that it is my responsibility to provide information about my medical history, conditions and care that is complete and accurate to the best of my ability. I acknowledge that Practitioner's advice, recommendations, and/or decision may be based on factors not within their control, such as incomplete or inaccurate data provided by me or distortions of diagnostic images or specimens that may result from electronic transmissions. I understand that the practice of medicine is not an exact science and that Practitioner makes no warranties or guarantees regarding treatment outcomes. I acknowledge that I will receive a copy of this consent concurrently upon execution via email to the email address I last provided but may also request a printed copy by calling the office of [place].    I understand that my insurance will be billed for this visit.   I have read or had this consent read to me. . I understand the  contents of this consent, which adequately explains the benefits and risks of the Services being provided via telemedicine.  . I have been provided ample opportunity to ask questions regarding this consent and the Services and have had my questions answered to my satisfaction. . I give my informed consent for the services to be provided through the use of telemedicine in my medical care  By participating in this telemedicine visit I agree to the above.

## 2018-06-26 NOTE — Addendum Note (Signed)
Addended by: Annita Brod on: 06/26/2018 02:30 PM   Modules accepted: Orders

## 2018-06-27 ENCOUNTER — Other Ambulatory Visit: Payer: Self-pay | Admitting: Pharmacist

## 2018-06-27 MED ORDER — EVOLOCUMAB 140 MG/ML ~~LOC~~ SOAJ: 140.0000 mg | SUBCUTANEOUS | 11 refills | Status: DC

## 2018-06-27 NOTE — Telephone Encounter (Signed)
Pa for Re[patha approved. Rx sent to prefer pharmacy and patient called.  Patient to call back if unable to afford to start patient assistance paperwork.

## 2018-07-03 ENCOUNTER — Telehealth: Payer: Self-pay

## 2018-07-03 NOTE — Telephone Encounter (Signed)
Called and spoke with patient regarding repatha approval and he stated that his copay is $100 will mail pt assistance once back in the office

## 2018-07-12 NOTE — Telephone Encounter (Signed)
Follow up    Patient is calling to obtain assistance with his patient assistance form. Please call to discuss.

## 2018-07-12 NOTE — Telephone Encounter (Signed)
Patient needed address to return form. Address provided. All other questions answered.

## 2018-08-29 ENCOUNTER — Other Ambulatory Visit: Payer: Self-pay | Admitting: Family Medicine

## 2018-08-30 ENCOUNTER — Telehealth: Payer: Self-pay

## 2018-08-30 NOTE — Telephone Encounter (Signed)
Called the pt to let them know that [employer] will not be giving them the medication for free and to apply w/ [employer] left msg

## 2018-09-05 ENCOUNTER — Telehealth: Payer: Self-pay | Admitting: Cardiovascular Disease

## 2018-09-05 ENCOUNTER — Telehealth: Payer: Self-pay

## 2018-09-05 NOTE — Telephone Encounter (Signed)
Called to let pt know that they should apply for the [employer]

## 2018-09-05 NOTE — Telephone Encounter (Signed)
Pt called in regarding needing some results from Dr. Gwenlyn Found will route to [place] and needs a callback

## 2018-09-05 NOTE — Telephone Encounter (Signed)
Patient returning call from 06/11

## 2018-09-05 NOTE — Telephone Encounter (Signed)
lmtcb

## 2018-09-05 NOTE — Telephone Encounter (Signed)
F/U Message             Patient is returning someone's call, no documentation on who called. Pls advise.

## 2018-09-19 ENCOUNTER — Telehealth: Payer: Self-pay

## 2018-09-19 NOTE — Telephone Encounter (Signed)
Notes recorded by Lorretta Harp, MD on 09/04/2018 at 12:55 PM EDT  Mr. Nathan Rodriguez lipid profile is not at goal and he is statin intolerant. Can you have Nathan Rodriguez please call him to discuss Repatha

## 2018-09-19 NOTE — Telephone Encounter (Signed)
Patient already approved for Repatha. Please see telephone notes.

## 2018-09-19 NOTE — Telephone Encounter (Signed)
lmom regarding wanting to know if the pt has applied for the [employer] and what their decision
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
## 2018-09-20 ENCOUNTER — Ambulatory Visit: Payer: Self-pay

## 2018-09-20 ENCOUNTER — Encounter: Payer: Self-pay | Admitting: Family Medicine

## 2018-09-20 ENCOUNTER — Other Ambulatory Visit: Payer: Self-pay

## 2018-09-20 ENCOUNTER — Telehealth: Payer: Self-pay

## 2018-09-20 ENCOUNTER — Ambulatory Visit: Payer: Medicare HMO | Admitting: Family Medicine

## 2018-09-20 VITALS — BP 100/80 | HR 79 | Ht 70.0 in | Wt 166.0 lb

## 2018-09-20 DIAGNOSIS — M25552 Pain in left hip: Secondary | ICD-10-CM | POA: Diagnosis not present

## 2018-09-20 DIAGNOSIS — G5702 Lesion of sciatic nerve, left lower limb: Secondary | ICD-10-CM | POA: Diagnosis not present

## 2018-09-20 NOTE — Telephone Encounter (Signed)
Called pt back regarding [employer] and the pt confirmed that they have gotten the med and gotten approved for $2500 grant w/[employer]

## 2018-09-20 NOTE — Patient Instructions (Addendum)
Good to see you  Ice 20 minutes 2 times daily. Usually after activity and before bed. Send a message in 2 weeks if not better we will consider MRI of the back  Happy 4th

## 2018-09-20 NOTE — Assessment & Plan Note (Signed)
Patient given an injection today.  Is having things and seems to be more secondary to sciatica those symptoms.  Hoping that this is causing more of the impingement though.  We discussed with patient icing regimen and home exercises patient does have normal positive straight leg test and her concern for possible nerve root impingement in the back.  If this does not seem to make a difference we would consider the MRI of the lumbar spine and possible injections.  Spent  25 minutes with patient face-to-face and had greater than 50% of counseling including as described above in assessment and plan.

## 2018-10-15 ENCOUNTER — Encounter: Payer: Self-pay | Admitting: Family Medicine

## 2018-10-15 DIAGNOSIS — M4807 Spinal stenosis, lumbosacral region: Secondary | ICD-10-CM

## 2018-10-23 ENCOUNTER — Ambulatory Visit
Admission: RE | Admit: 2018-10-23 | Discharge: 2018-10-23 | Disposition: A | Discharge status: Home / Self Care | Payer: Medicare HMO | Source: Ambulatory Visit | Attending: Family Medicine | Admitting: Family Medicine

## 2018-10-23 ENCOUNTER — Other Ambulatory Visit: Payer: Self-pay

## 2018-10-23 DIAGNOSIS — M4807 Spinal stenosis, lumbosacral region: Secondary | ICD-10-CM

## 2018-10-26 ENCOUNTER — Encounter: Payer: Self-pay | Admitting: Family Medicine

## 2018-10-29 NOTE — Telephone Encounter (Signed)
Left message for patient to call back  

## 2018-11-02 NOTE — Progress Notes (Addendum)
Virtual Visit via Video Note  I connected with Nathan Rodriguez  by a video enabled telemedicine application and verified that I am speaking with the correct person using two identifiers.  Location: Patient: at home  Provider: in office    I discussed the limitations of evaluation and management by telemedicine and the availability of in person appointments. The patient expressed understanding and agreed to proceed.  History of Present Illness: Back pain.  Patient was having significant amount of back pain with radicular symptoms.  Responding somewhat to piriformis and gluteal tendon injections but never seemed to completely go away.  Patient was sent for an MRI.  MRI was independently visualized by me showing the patient did have moderate spinal stenosis with a left-sided foraminal impingement at L2-L3 and L3-L4.    Observations/Objective: Seems to be doing relatively well but still some mild shortness of breath at baseline.   Assessment and Plan: Patient does have spinal stenosis with a left-sided some reticular impingement.  Patient is going to have a epidural which is scheduled today.  Patient will follow-up with me again in 2 to 3 weeks afterwards to discuss further treatment.   Follow Up Instructions:    I discussed the assessment and treatment plan with the patient. The patient was provided an opportunity to ask questions and all were answered. The patient agreed with the plan and demonstrated an understanding of the instructions.   The patient was advised to call back or seek an in-person evaluation if the symptoms worsen or if the condition fails to improve as anticipated.  I provided 26 minutes of non-face-to-face time during this encounter.   Lyndal Pulley, DO

## 2018-11-05 ENCOUNTER — Encounter: Payer: Self-pay | Admitting: Family Medicine

## 2018-11-05 ENCOUNTER — Ambulatory Visit (INDEPENDENT_AMBULATORY_CARE_PROVIDER_SITE_OTHER): Payer: Medicare HMO | Admitting: Family Medicine

## 2018-11-05 ENCOUNTER — Other Ambulatory Visit: Payer: Self-pay

## 2018-11-05 DIAGNOSIS — M5136 Other intervertebral disc degeneration, lumbar region: Secondary | ICD-10-CM

## 2018-11-05 DIAGNOSIS — M5416 Radiculopathy, lumbar region: Secondary | ICD-10-CM

## 2018-11-19 ENCOUNTER — Other Ambulatory Visit: Payer: Self-pay

## 2018-11-19 ENCOUNTER — Ambulatory Visit
Admission: RE | Admit: 2018-11-19 | Discharge: 2018-11-19 | Disposition: A | Discharge status: Home / Self Care | Payer: Medicare HMO | Source: Ambulatory Visit | Attending: Family Medicine | Admitting: Family Medicine

## 2018-11-19 DIAGNOSIS — M5416 Radiculopathy, lumbar region: Secondary | ICD-10-CM

## 2018-11-19 MED ORDER — IOPAMIDOL (ISOVUE-M 200) INJECTION 41%
1.0000 mL | Freq: Once | INTRAMUSCULAR | Status: AC
  Administered 2018-11-19: 11:00:00 1 mL via EPIDURAL

## 2018-11-19 MED ORDER — METHYLPREDNISOLONE ACETATE 40 MG/ML INJ SUSP (RADIOLOG
120.0000 mg | Freq: Once | INTRAMUSCULAR | Status: AC
  Administered 2018-11-19: 120 mg via EPIDURAL

## 2018-11-19 NOTE — Discharge Instructions (Signed)

## 2018-12-06 ENCOUNTER — Ambulatory Visit (INDEPENDENT_AMBULATORY_CARE_PROVIDER_SITE_OTHER): Payer: Medicare HMO | Admitting: Family Medicine

## 2018-12-06 ENCOUNTER — Encounter: Payer: Self-pay | Admitting: Family Medicine

## 2018-12-06 ENCOUNTER — Other Ambulatory Visit: Payer: Self-pay

## 2018-12-06 ENCOUNTER — Ambulatory Visit (INDEPENDENT_AMBULATORY_CARE_PROVIDER_SITE_OTHER)
Admission: RE | Admit: 2018-12-06 | Discharge: 2018-12-06 | Disposition: A | Discharge status: Home / Self Care | Payer: Medicare HMO | Source: Ambulatory Visit | Attending: Family Medicine | Admitting: Family Medicine

## 2018-12-06 VITALS — BP 138/94 | HR 63 | Ht 70.0 in | Wt 169.0 lb

## 2018-12-06 DIAGNOSIS — M19011 Primary osteoarthritis, right shoulder: Secondary | ICD-10-CM | POA: Diagnosis not present

## 2018-12-06 DIAGNOSIS — M25511 Pain in right shoulder: Secondary | ICD-10-CM | POA: Diagnosis not present

## 2018-12-06 DIAGNOSIS — M5136 Other intervertebral disc degeneration, lumbar region: Secondary | ICD-10-CM

## 2018-12-06 DIAGNOSIS — G8929 Other chronic pain: Secondary | ICD-10-CM

## 2018-12-06 NOTE — Assessment & Plan Note (Addendum)
Degenerative disc disease, lumbar, discussed which activities of doing which wants to avoid.  Increase activity slowly.  Follow-up again in 4 to 8 weeks responded well to epidural and can repeat if necessary

## 2018-12-06 NOTE — Assessment & Plan Note (Signed)
Patient has significant crepitus on range of motion and given an injection today.  X-rays ordered today.  Patient did have x-rays previously 8 months ago with very minimal arthritic changes.  I believe though the patient does have severe arthritic changes at this time.  Would likely need some type of replacement if injection does not help.  Patient did have some mild improvement in range of motion.  Differential includes more of a calcific tendinitis.  Also possible labral pathology.  Follow-up with me again 4 weeks

## 2018-12-06 NOTE — Patient Instructions (Addendum)
Good to see you Injection today If xray shows mild arthritis lets get MRI. Severe arthritis we will watch it See [place] again in 6 weeks
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
## 2018-12-07 ENCOUNTER — Encounter: Payer: Self-pay | Admitting: Family Medicine

## 2019-01-21 ENCOUNTER — Ambulatory Visit: Payer: Medicare HMO | Admitting: Family Medicine
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
## 2019-03-18 ENCOUNTER — Ambulatory Visit: Payer: Medicare HMO | Admitting: Family Medicine

## 2019-03-18 ENCOUNTER — Other Ambulatory Visit: Payer: Self-pay

## 2019-03-18 ENCOUNTER — Encounter: Payer: Self-pay | Admitting: Family Medicine

## 2019-03-18 DIAGNOSIS — M19011 Primary osteoarthritis, right shoulder: Secondary | ICD-10-CM

## 2019-03-18 DIAGNOSIS — M25511 Pain in right shoulder: Secondary | ICD-10-CM | POA: Insufficient documentation

## 2019-03-18 NOTE — Patient Instructions (Signed)
Send me an update in a month See me in 3 months

## 2019-03-18 NOTE — Assessment & Plan Note (Addendum)
Rotator cuff arthropathy, injection given again today.  This time we did glenohumeral as well as we did acromioclavicular joint.  Patient has responded well to this in the past.  Discussed which activities to do which wants to avoid.  Patient she seems that the injections are helping.  Patient wants to avoid any type of surgical intervention.  Because of this I do not feel advanced imaging is warranted.  Follow-up again in 12 weeks.

## 2019-03-18 NOTE — Assessment & Plan Note (Signed)
Patient given injection and tolerated the procedure well, discussed icing regimen and home exercises, discussed which activities to do which wants to avoid.  Increase activity as tolerated.  Follow-up again in 4 to 8 weeks

## 2019-05-16 DIAGNOSIS — R42 Dizziness and giddiness: Secondary | ICD-10-CM | POA: Insufficient documentation

## 2019-05-16 DIAGNOSIS — F39 Unspecified mood [affective] disorder: Secondary | ICD-10-CM | POA: Insufficient documentation

## 2019-05-16 DIAGNOSIS — R4189 Other symptoms and signs involving cognitive functions and awareness: Secondary | ICD-10-CM | POA: Insufficient documentation

## 2019-06-03 ENCOUNTER — Encounter: Payer: Self-pay | Admitting: Cardiovascular Disease

## 2019-06-03 ENCOUNTER — Telehealth: Payer: Self-pay | Admitting: Pharmacist

## 2019-06-03 DIAGNOSIS — E782 Mixed hyperlipidemia: Secondary | ICD-10-CM

## 2019-06-03 NOTE — Telephone Encounter (Signed)
Submitted a pa for repatha to cover my meds today 06/03/19

## 2019-06-03 NOTE — Telephone Encounter (Signed)
Pt c/o medication issue:  1. Name of Medication: Evolocumab (REPATHA SURECLICK) [id number] MG/ML SOAJ  2. How are you currently taking this medication (dosage and times per day)? Inject every 14 days  3. Are you having a reaction (difficulty breathing--STAT)? no  4. What is your medication issue? Patient states his insurance is changing and he needs approval for the medication.

## 2019-06-03 NOTE — Telephone Encounter (Signed)
error 

## 2019-06-04 ENCOUNTER — Telehealth: Payer: Self-pay | Admitting: Pharmacist

## 2019-06-04 NOTE — Addendum Note (Signed)
Addended by: Allean Found on: 06/04/2019 01:06 PM   Modules accepted: Orders

## 2019-06-04 NOTE — Telephone Encounter (Signed)
Medication Samples have been provided to the patient.  Drug name: Repatha       Strength: 140mg         Qty: 2  [id number]  Exp.Date: 03/23  Priyanka Causey Rodriguez-Guzman PharmD, BCPS, [place] [address] [place] [address] 06/04/2019 1:19 PM

## 2019-06-04 NOTE — Telephone Encounter (Signed)
Called and spoke to the pt who seemed upset regarding the denial of the repatha. The repatha was denied due to lack of labs. I instructed the pt that lipid profile is needed asap to complete an appeal. The pt voiced understanding. Also I told the pt that I would give them a sample of repatha

## 2019-06-04 NOTE — Telephone Encounter (Signed)
Follow up   Patient has questions about getting his repatha approved. Please call to discuss.

## 2019-06-05 LAB — LIPID PANEL
Chol/HDL Ratio: 2.4 ratio (ref 0.0–5.0)
Cholesterol, Total: 143 mg/dL (ref 100–199)
HDL: 60 mg/dL (ref >39)
LDL Chol Calc (NIH): 68 mg/dL (ref 0–99)
Triglycerides: 74 mg/dL (ref 0–149)
VLDL Cholesterol Cal: 15 mg/dL (ref 5–40)

## 2019-06-13 ENCOUNTER — Other Ambulatory Visit: Payer: Self-pay | Admitting: Cardiology

## 2019-06-17 DIAGNOSIS — F419 Anxiety disorder, unspecified: Secondary | ICD-10-CM | POA: Insufficient documentation

## 2019-06-17 NOTE — Telephone Encounter (Signed)
Called the pt to straighten out the denial and they stated that they are approved and that they are currently good for now

## 2019-07-10 ENCOUNTER — Other Ambulatory Visit: Payer: Self-pay | Admitting: Cardiovascular Disease

## 2019-08-07 ENCOUNTER — Ambulatory Visit: Payer: Self-pay

## 2019-08-07 ENCOUNTER — Other Ambulatory Visit: Payer: Self-pay | Admitting: Cardiology

## 2019-08-07 ENCOUNTER — Encounter: Payer: Self-pay | Admitting: Family Medicine

## 2019-08-07 ENCOUNTER — Ambulatory Visit: Payer: Medicare Other | Admitting: Family Medicine

## 2019-08-07 ENCOUNTER — Other Ambulatory Visit: Payer: Self-pay

## 2019-08-07 VITALS — BP 100/80 | HR 74 | Ht 70.0 in | Wt 168.0 lb

## 2019-08-07 DIAGNOSIS — M19011 Primary osteoarthritis, right shoulder: Secondary | ICD-10-CM

## 2019-08-07 DIAGNOSIS — M25511 Pain in right shoulder: Secondary | ICD-10-CM

## 2019-08-07 DIAGNOSIS — G8929 Other chronic pain: Secondary | ICD-10-CM | POA: Diagnosis not present

## 2019-08-07 DIAGNOSIS — M5136 Other intervertebral disc degeneration, lumbar region: Secondary | ICD-10-CM

## 2019-08-07 MED ORDER — GABAPENTIN 100 MG PO CAPS: 200.0000 mg | ORAL_CAPSULE | Freq: Every day | ORAL | 3 refills | Status: DC
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
## 2019-08-07 NOTE — Assessment & Plan Note (Signed)
Degenerative disc disease with worsening symptoms.  We discussed with him different treatment options but patient has elected to have another epidural at this time.

## 2019-08-07 NOTE — Assessment & Plan Note (Signed)
Patient does have rotator cuff arthropathy as well as acromioclavicular arthritis.  Patient given injection again today.  Tolerated the procedures well.  Discussed icing regimen and home exercise.  Chronic with exacerbation of the chronic problem.  Discussed with patient that otherwise surgical intervention will be necessary but hopefully patient will continue to improve.  Follow-up again in 4 to 8 weeks

## 2019-08-07 NOTE — Patient Instructions (Addendum)
Good to see you Gabapentin 1 pill at night for 1 week then 2 pills after Stay active keep hands within peripheral vision  See me again in 2-3 months

## 2019-08-12 ENCOUNTER — Encounter: Payer: Self-pay | Admitting: Family Medicine

## 2019-08-13 ENCOUNTER — Other Ambulatory Visit: Payer: Self-pay

## 2019-08-13 DIAGNOSIS — M5136 Other intervertebral disc degeneration, lumbar region: Secondary | ICD-10-CM

## 2019-08-13 NOTE — Progress Notes (Addendum)
-------------------------------------------------------------------------------   Summary: Follow Up  -------------------------------------------------------------------------------   [facility] [address] [place], [place] [address] Ph: [phone] Fax: [phone]  Neurology Follow Up Note:  Patient Summary:    Nathan Rodriguez is a 52 y /o male with history of Factor V Leiden deficiency, Pulmonary Embolism, DVT, HTN who presented to clinic with memory complaints.  Bedside cognitive testing is within normal limits. Patients functional status is intact. MOCA:29/30.  Interval History:  Patient had neuropsychological testing completed, discussion of preliminary results with Dr. Marykay, and testing was reportedly normal with depressive symptoms. Patient reports he has recently followed with sports medicine doctor, and received injections in shoulder, and feels better. Also was started on gabapentin , and feels better overall, but only a small amount, and still doesn't like taking a lot of medication. He feels like his depression has improved as well, because he is able to move around with less pain. Reports he saw psychiatrist, but didn't connect with her, and wants to see a counselor, which he is open to.   Review of Systems:  Change in breathing or pulmonary function:no Change in cardiac status:no Headaches:no Chronic Pain:no Balance problems or falls:no recent falls  Tremor:no Cramps or abnormal muscle movements:no Changes in mood:depression, feels better Changes in urinary function:yes, continued increased urgency  Changes in bowel habits or function:no Changes in Vision:denies any double vision, blurry vision, or loss of vision  Current Outpatient Medications  Medication Sig Dispense Refill  . albuterol 90 mcg/actuation inhaler Inhale 2 puffs into the lungs every 6 (six) hours as needed for up to 30 days for Wheezing. 1  Inhaler 11  . aspirin  81 MG EC tablet *ANTIPLATELET* Take by mouth.    . cholecalciferol, vitamin D3, 400 unit Cap Take 1 tablet by mouth.    . CYANOCOBALAMIN, VITAMIN B-12, (CYANOCOBALAMIN,VIT B-12,,BULK,) Powd Take 1 tablet by mouth.    . evolocumab  (REPATHA ) 140 mg/mL injection Inject 140 mg into the skin every 14 days.    SABRA glucosam-MSM-chond-hyaluron ac 750-60-150-1 mg Tab Take 1 tablet by mouth.    . omega-3 fatty acids-fish oil 300-1,000 mg Cap Take 1 capsule by mouth.    SABRA omeprazole (PRILOSEC) 20 MG capsule Take by mouth.    . XARELTO  20 mg tablet *ANTICOAGULANT* TAKE 1 TABLET BY MOUTH ONCE DAILY WITH EVENING MEAL     No current facility-administered medications for this visit.   No Known Allergies   Physical Exam: General in NAD   Diagnostic Studies / Records review   MRI Brain 06/01/2019: IMPRESSION: 1. No acute intracranial abnormality. 2. Old, punctate right cerebellar small vessel infarct and moderate white matter changes of chronic small vessel ischemia.. 3. No age advanced or lobar predominant volume loss.  Impression and Plan  Patient is a 74 y/o male with subjective memory complaints, with reported normal cognitive testing.   Plan:  1. Discussed preliminary results of neuropsychological testing, should follow up with formal report from Dr. Marykay. 2. Follow up in one year or sooner if needed  I have personally spent 10 minutes involved in face-to-face and non-face-to-face activities for this patient on the day of the visit.  Professional time spent includes the following activities, in addition to those noted in the documentation: discussion and counseling, note writing      Electronically signed by: Alan Lovella Coup, MD 08/13/19 1229    Electronically signed by: Alan Lovella Coup, MD 08/13/19 1231

## 2019-08-17 ENCOUNTER — Encounter: Payer: Self-pay | Admitting: Family Medicine

## 2019-08-20 DIAGNOSIS — F331 Major depressive disorder, recurrent, moderate: Secondary | ICD-10-CM | POA: Insufficient documentation

## 2019-08-21 ENCOUNTER — Other Ambulatory Visit: Payer: Self-pay | Admitting: Cardiology

## 2019-08-21 ENCOUNTER — Ambulatory Visit
Admission: RE | Admit: 2019-08-21 | Discharge: 2019-08-21 | Disposition: A | Discharge status: Home / Self Care | Payer: Medicare Other | Source: Ambulatory Visit | Attending: Family Medicine | Admitting: Family Medicine

## 2019-08-21 DIAGNOSIS — M5136 Other intervertebral disc degeneration, lumbar region: Secondary | ICD-10-CM

## 2019-08-21 MED ORDER — METHYLPREDNISOLONE ACETATE 40 MG/ML INJ SUSP (RADIOLOG
120.0000 mg | Freq: Once | INTRAMUSCULAR | Status: AC
  Administered 2019-08-21: 120 mg via EPIDURAL

## 2019-08-21 MED ORDER — IOPAMIDOL (ISOVUE-M 200) INJECTION 41%
1.0000 mL | Freq: Once | INTRAMUSCULAR | Status: AC
  Administered 2019-08-21: 1 mL via EPIDURAL

## 2019-08-21 NOTE — Discharge Instructions (Signed)
Spinal Injection Discharge Instruction Sheet  1. You may resume a regular diet and any medications that you routinely take, including pain medications.  2. No driving the rest of the day of the procedure.  3. Light activity throughout the rest of the day.  Do not do any strenuous work, exercise, bending or lifting.  The day following the procedure, you may resume normal physical activity but you should refrain from exercising or physical therapy for at least three days.   Common Side Effects:   Headaches- take your usual medications as directed by your physician.     Restlessness or inability to sleep- you may have trouble sleeping for the next few days.  Ask your referring physician if you need any medication for sleep if over the counter sleep medications do not help.   Facial flushing or redness- this should subside within a few days.   Increased pain- a temporary increase in pain a day or two following your procedure is not unusual.  Take your pain medication as prescribed by your referring physician.  You may use ice to the injection site as needed.  Please do not use heat for 24 hours.   Leg cramps  Please contact our office at [phone] for the following symptoms:  Fever greater than 100 degrees.  Headaches unresolved with medication after 2-3 days.  Increased swelling, pain, or redness at injection site.  Thank you for visiting our office.   You may resume Xarelto on Thursday, August 22, 2019 after 10:00a.m.

## 2019-08-22 ENCOUNTER — Other Ambulatory Visit: Payer: Self-pay | Admitting: Cardiology

## 2019-08-22 MED ORDER — METOPROLOL TARTRATE 25 MG PO TABS: 25.0000 mg | ORAL_TABLET | Freq: Two times a day (BID) | ORAL | 0 refills | Status: DC

## 2019-08-22 NOTE — Telephone Encounter (Signed)
*  STAT* If patient is at the pharmacy, call can be transferred to refill team.   1. Which medications need to be refilled? (please list name of each medication and dose if known) metoprolol tartrate (LOPRESSOR) 25 MG tablet   2. Which pharmacy/location (including street and city if local pharmacy) is medication to be sent to? [facility] - [place], [place] - [address]  3. Do they need a 30 day or 90 day supply? 90  
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
## 2019-09-04 ENCOUNTER — Ambulatory Visit: Payer: Medicare Other | Admitting: Student
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
## 2019-09-30 ENCOUNTER — Encounter: Payer: Self-pay | Admitting: Student

## 2019-09-30 ENCOUNTER — Ambulatory Visit: Payer: Medicare Other | Admitting: Student

## 2019-09-30 ENCOUNTER — Other Ambulatory Visit: Payer: Self-pay

## 2019-09-30 VITALS — BP 120/90 | HR 67 | Ht 70.0 in | Wt 161.0 lb

## 2019-09-30 DIAGNOSIS — R002 Palpitations: Secondary | ICD-10-CM | POA: Diagnosis not present

## 2019-09-30 DIAGNOSIS — I10 Essential (primary) hypertension: Secondary | ICD-10-CM | POA: Diagnosis not present

## 2019-09-30 DIAGNOSIS — I119 Hypertensive heart disease without heart failure: Secondary | ICD-10-CM

## 2019-09-30 DIAGNOSIS — G4733 Obstructive sleep apnea (adult) (pediatric): Secondary | ICD-10-CM

## 2019-09-30 DIAGNOSIS — I2510 Atherosclerotic heart disease of native coronary artery without angina pectoris: Secondary | ICD-10-CM | POA: Diagnosis not present

## 2019-09-30 LAB — BASIC METABOLIC PANEL
BUN/Creatinine Ratio: 17 (ref 10–24)
BUN: 19 mg/dL (ref 8–27)
CO2: 25 mmol/L (ref 20–29)
Calcium: 9.8 mg/dL (ref 8.6–10.2)
Chloride: 105 mmol/L (ref 96–106)
Creatinine, Ser: 1.11 mg/dL (ref 0.76–1.27)
GFR calc Af Amer: 75 mL/min/{1.73_m2} (ref >59)
GFR calc non Af Amer: 65 mL/min/{1.73_m2} (ref >59)
Glucose: 110 mg/dL — ABNORMAL HIGH (ref 65–99)
Potassium: 4.6 mmol/L (ref 3.5–5.2)
Sodium: 142 mmol/L (ref 134–144)

## 2019-09-30 LAB — CBC WITH DIFFERENTIAL/PLATELET
Basophils Absolute: 0 10*3/uL (ref 0.0–0.2)
Basos: 0 %
EOS (ABSOLUTE): 0.1 10*3/uL (ref 0.0–0.4)
Eos: 2 %
Hematocrit: 48.9 % (ref 37.5–51.0)
Hemoglobin: 16.4 g/dL (ref 13.0–17.7)
Immature Grans (Abs): 0 10*3/uL (ref 0.0–0.1)
Immature Granulocytes: 0 %
Lymphocytes Absolute: 1.3 10*3/uL (ref 0.7–3.1)
Lymphs: 22 %
MCH: 32.2 pg (ref 26.6–33.0)
MCHC: 33.5 g/dL (ref 31.5–35.7)
MCV: 96 fL (ref 79–97)
Monocytes Absolute: 0.9 10*3/uL (ref 0.1–0.9)
Monocytes: 14 %
Neutrophils Absolute: 3.8 10*3/uL (ref 1.4–7.0)
Neutrophils: 62 %
Platelets: 289 10*3/uL (ref 150–450)
RBC: 5.1 x10E6/uL (ref 4.14–5.80)
RDW: 12.8 % (ref 11.6–15.4)
WBC: 6.1 10*3/uL (ref 3.4–10.8)

## 2019-09-30 NOTE — Patient Instructions (Addendum)
Medication Instructions:  *If you need a refill on your cardiac medications before your next appointment, please call your pharmacy*  Lab Work: Your physician has recommended that you have lab work today: BMET and CBC  If you have labs (blood work) drawn today and your tests are completely normal, you will receive your results only by: Marland Kitchen MyChart Message (if you have MyChart) OR . A paper copy in the mail If you have any lab test that is abnormal or we need to change your treatment, we will call you to review the results.  Follow-Up: At [facility], you and your health needs are our priority.  As part of our continuing mission to provide you with exceptional heart care, we have created designated Provider Care Teams.  These Care Teams include your primary Cardiologist (physician) and Advanced Practice Providers (APPs -  Physician Assistants and Nurse Practitioners) who all work together to provide you with the care you need, when you need it.  We recommend signing up for the patient portal called "MyChart".  Sign up information is provided on this After Visit Summary.  MyChart is used to connect with patients for Virtual Visits (Telemedicine).  Patients are able to view lab/test results, encounter notes, upcoming appointments, etc.  Non-urgent messages can be sent to your provider as well.   To learn more about what you can do with MyChart, go to [email or web address].    Your next appointment:   Your physician recommends that you schedule a follow-up appointment in 4 MONTHS with Dr. Gwenlyn Found -- Tuesday, Jan 28 2020 at 10:15 am  The format for your next appointment:   In Person with Quay Burow, MD   Other Instructions You will follow up with EP on an as needed basis.

## 2019-11-12 ENCOUNTER — Telehealth: Payer: Self-pay | Admitting: Pharmacist

## 2019-11-12 NOTE — Telephone Encounter (Signed)
Patient will like to talk to [facility] about his Repatha PA renewal. Grandville Silos is working @ [employer] today , but will call him back as soon as possible.

## 2019-11-12 NOTE — Telephone Encounter (Signed)
Returned a call and lmomed the pt to call back to get in touch w/me

## 2019-11-21 ENCOUNTER — Other Ambulatory Visit: Payer: Self-pay

## 2019-11-21 ENCOUNTER — Encounter: Payer: Self-pay | Admitting: Pharmacist

## 2019-11-21 ENCOUNTER — Ambulatory Visit (INDEPENDENT_AMBULATORY_CARE_PROVIDER_SITE_OTHER): Payer: Medicare Other | Admitting: Pharmacist

## 2019-11-21 ENCOUNTER — Telehealth: Payer: Self-pay | Admitting: Student

## 2019-11-21 VITALS — BP 120/90 | HR 81 | Resp 15 | Ht 70.5 in | Wt 160.0 lb

## 2019-11-21 DIAGNOSIS — R002 Palpitations: Secondary | ICD-10-CM

## 2019-11-21 DIAGNOSIS — E782 Mixed hyperlipidemia: Secondary | ICD-10-CM

## 2019-11-21 DIAGNOSIS — R7303 Prediabetes: Secondary | ICD-10-CM | POA: Diagnosis not present

## 2019-11-21 DIAGNOSIS — Z79899 Other long term (current) drug therapy: Secondary | ICD-10-CM

## 2019-11-21 DIAGNOSIS — R000 Tachycardia, unspecified: Secondary | ICD-10-CM

## 2019-11-21 MED ORDER — METOPROLOL TARTRATE 25 MG PO TABS: 37.5000 mg | ORAL_TABLET | Freq: Every day | ORAL | 6 refills | Status: DC

## 2019-11-21 NOTE — Patient Instructions (Addendum)
Return for a follow up appointment in [place] your blood pressure at home daily (if able) and keep record of the readings.  CHANGES:  *DECREASE eggs, and cheese in diet* *Okay to use more peanuts and almonds*  *CHANGE metoprolol to 12.5mg  twice daily* *Discuss escitalopram change with primary care (sport medicine)*  *REPEAT fasting blood work in 3 months*
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
## 2019-11-21 NOTE — Telephone Encounter (Signed)
Called patient to discuss symptoms and concerns brought up at appointment this am.   He states he has been feeling occasionally tachy-palpitations similar to prior to starting on lopressor.  He does not like to take it in the evening, as he has a hard time waking up in the morning, so would prefer to leave it once daily.  He would also not like to double to 50, but wants to titrate up slowly to 37.5 mg daily.   Pt states he typically can get his HRs up into 120s with exercise, but presently he goes for a few minutes and his HRs are already in the 130s.  He denies any chest pain. Main complaint is some fatigue and decreased exercise tolerance.  He states these symptoms are NOT similar to when he required stenting in the past.   He would like to increase lopressor and see how he feels. I have tentatively made him an appointment for 9/22, which he will call and cancel (per his request) if he feels better.   Legrand Como [address]" [place], PA-C  11/21/2019 4:08 PM

## 2019-11-21 NOTE — Progress Notes (Signed)
Patient ID: Nathan Rodriguez                 DOB: 05/30/1945                    MRN: [id number]     HPI: Nathan Rodriguez is a 74 y.o. male patient referred to lipid clinic by Dr Gwenlyn Found. PMH is significant for CAD s/p stent placement in 10/2010, hypertension, palpitations, sleep apnea, and hyperlipidemia. He is an avid cyclist running 16 miles per day.  He is following a low carb diet to improve his insuline resistance. Noted LDL also increased from 56mg /dL on Repatha to recent 113 mg/dL. Patient admits missing few doses, but will be more careful with compliance. He has few question related to his A1C, and anxiety/depression management.   Current Medications: none  Intolerances:  Simvastatin pravastatin  LDL goal: < 70mg /mL  Diet: low carbohydrate   Exercise: usual cycline 30 miles per day, now down to 16 miles  Family History: The patient's family history includes Alzheimer's disease in his mother; Breast cancer in his mother; Clotting disorder in his brother; Diabetes in his father; Factor V Leiden deficiency in his brother and father; Heart disease in his father; Heart failure in his father.   Social History: The patient  reports that he quit smoking about 16 years ago. His smoking use included cigarettes. He has a 10.75 pack-year smoking history. He has never used smokeless tobacco. He reports current alcohol use. He reports that he does not use drugs.   Labs: at Dr Tamala Julian PCP - no copy in Epic or [facility]  Past Medical History:  Diagnosis Date  . Arthritis   . CAD (coronary artery disease)   . Complication of anesthesia    hard time to wake up  . Coronary artery disease    a. 2012  s/p prior DES x 2 to the LAD;  b. 05/2015 nl stress test [facility]);  c. 6/20217 Cath: LM nl, LAD patent stents, LCX/RCA nl, EF 55-65%.  . DDD (degenerative disc disease)   . Dyspnea on exertion    a. has been seen by pulmonology - reportedly told that everything nl.  . Factor V deficiency ([place])   .  GERD (gastroesophageal reflux disease)   . Hypercholesteremia   . Hypertensive heart disease   . Nocturia   . Occipital headache    sight changes without migraines, for over 30  years  . Pulmonary embolism ([place])   . Scoliosis   . Sleep apnea    mild, CPAP  . Tinnitus     Current Outpatient Medications on File Prior to Visit  Medication Sig Dispense Refill  . Cholecalciferol (VITAMIN D-3 PO) Take 1 tablet by mouth daily.    . Coenzyme Q10 (CO Q 10 PO) Take by mouth daily.    . Cyanocobalamin (VITAMIN B-12 PO) Take 1 tablet by mouth daily.    Marland Kitchen ibuprofen (ADVIL,MOTRIN) 200 MG tablet Take 200 mg by mouth daily as needed for headache or moderate pain.    . magnesium gluconate (MAGONATE) 500 MG tablet Take 500 mg by mouth 1 day or 1 dose. Take 1/2 tablet once a day    . MELATONIN PO Take 1 tablet by mouth at bedtime.     . Multiple Vitamin (MULTIVITAMIN WITH MINERALS) TABS tablet Take 1 tablet by mouth daily.    . Omega-3 Fatty Acids (OMEGA 3 PO) Take 1 capsule by mouth daily.    Marland Kitchen  omeprazole (PRILOSEC) 20 MG capsule Take 20 mg by mouth daily.    Marland Kitchen REPATHA SURECLICK 440 MG/ML SOAJ INJECT 140 MG INTO THE SKIN EVERY 14 DAYS 2 mL 11  . rivaroxaban (XARELTO) 20 MG TABS tablet Take 20 mg by mouth daily with supper.    . Wheat Dextrin (BENEFIBER DRINK MIX PO) Take 1 ampule by mouth daily as needed. Pour contents of pouch into water bottle and drink once a day as needed for constipation     No current facility-administered medications on file prior to visit.    No Known Allergies  Hyperlipidemia LDL above goal while on Repatha after reaching 56mg /dL. Patient admits missing few doses of Repatha and started low carbohydrate diet. Noted increased amount of cheese and eggs in diet.  He is to continue Repatha 140mg  every 14 days, increase compliance, and repeat fasting blood work in 3 months. Needs to decrease cheese and eggs in diet. Okay to increase almonds.  Medication management Patient  wasinstructed to follow up with PCP for depression/anxiety medication adjustment and neuropathy management.   Pre-diabetes PCP following up A1C and insulin levels. His fasting insulin drop to normal level after following low carbohydrate diet for 3 months, but A1C remains at 6.1.  Patient understands potential risk of Repatha causing some increase in glucose level and will continue to follow low carbohydrate diet. Plan to repeat A1C in 3 months together with LDL levels. No new medication was recommended at this time.  Tachycardia Patein complains of some very low HR number in the 40-50s follow by some days of HR above 100. Also noticed decreased pulse ox (drop from 98% to 94%) at home, but denies any other symptoms. He will prefer [place] to contact his cardiologist, but refuses consult with [employer].  Nathan Rodriguez PharmD, BCPS, [place] [address] [place] [address] 11/27/2019 1:27 PM

## 2019-11-27 ENCOUNTER — Encounter: Payer: Self-pay | Admitting: Pharmacist

## 2019-11-27 DIAGNOSIS — Z79899 Other long term (current) drug therapy: Secondary | ICD-10-CM | POA: Insufficient documentation

## 2019-11-27 DIAGNOSIS — R7303 Prediabetes: Secondary | ICD-10-CM | POA: Insufficient documentation

## 2019-11-27 NOTE — Assessment & Plan Note (Signed)
Patient wasinstructed to follow up with PCP for depression/anxiety medication adjustment and neuropathy management.

## 2019-11-27 NOTE — Assessment & Plan Note (Signed)
LDL above goal while on Repatha after reaching 56mg /dL. Patient admits missing few doses of Repatha and started low carbohydrate diet. Noted increased amount of cheese and eggs in diet.  He is to continue Repatha 140mg  every 14 days, increase compliance, and repeat fasting blood work in 3 months. Needs to decrease cheese and eggs in diet. Okay to increase almonds.

## 2019-11-27 NOTE — Assessment & Plan Note (Signed)
PCP following up A1C and insulin levels. His fasting insulin drop to normal level after following low carbohydrate diet for 3 months, but A1C remains at 6.1.  Patient understands potential risk of Repatha causing some increase in glucose level and will continue to follow low carbohydrate diet. Plan to repeat A1C in 3 months together with LDL levels. No new medication was recommended at this time.

## 2019-11-27 NOTE — Assessment & Plan Note (Signed)
Patein complains of some very low HR number in the 40-50s follow by some days of HR above 100. Also noticed decreased pulse ox (drop from 98% to 94%) at home, but denies any other symptoms. He will prefer [place] to contact his cardiologist, but refuses consult with [employer].
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
## 2019-12-09 ENCOUNTER — Ambulatory Visit: Payer: Medicare Other | Admitting: Family Medicine

## 2019-12-09 ENCOUNTER — Ambulatory Visit: Payer: Self-pay

## 2019-12-09 ENCOUNTER — Encounter: Payer: Self-pay | Admitting: Family Medicine

## 2019-12-09 ENCOUNTER — Ambulatory Visit (INDEPENDENT_AMBULATORY_CARE_PROVIDER_SITE_OTHER): Payer: Medicare Other

## 2019-12-09 ENCOUNTER — Other Ambulatory Visit: Payer: Self-pay

## 2019-12-09 VITALS — BP 110/70 | HR 60 | Ht 70.5 in | Wt 160.0 lb

## 2019-12-09 DIAGNOSIS — M25511 Pain in right shoulder: Secondary | ICD-10-CM | POA: Diagnosis not present

## 2019-12-09 DIAGNOSIS — G8929 Other chronic pain: Secondary | ICD-10-CM | POA: Diagnosis not present

## 2019-12-09 NOTE — Patient Instructions (Addendum)
Thank you for coming in today.  You had a R shoulder injection today.  Call or go to the ER if you develop a large red swollen joint with extreme pain or oozing puss.   Please get an Xray today before you leave  Continue your exercise.   You will likely need a shoulder replacement eventually.   We can continue to do these shots about every 3 months.

## 2019-12-09 NOTE — Progress Notes (Signed)
Nathan Rodriguez, am serving as a Education administrator for Dr. [name].  Nathan Rodriguez is a 74 y.o. male who presents to [place] at [facility] today for f/u of R shoulder pain.  He was last seen by Dr. Tamala Julian on 08/07/19 and had a R GHJ and R ACJ injection.  Since his last visit, pt reports that the last couple of weeks his shoulder has been progressively worse and states is due fo his injection he usually gets for this pain.   Diagnostic testing: R shoulder XR- 12/06/18 and 04/18/18   Pertinent review of systems: No fevers or chills  Relevant historical information: No fevers or chills   Exam:  BP 110/70 (BP Location: Left Arm, Patient Position: Sitting, Cuff Size: Normal)   Pulse 60   Ht 5' 10.5" (1.791 m)   Wt 160 lb (72.6 kg)   SpO2 99%   BMI 22.63 kg/m  General: Well Developed, well nourished, and in no acute distress.   MSK: Shoulder normal-appearing. Decreased range of motion to abduction and external rotation with palpable squeak and crepitation with motion. Strength is intact.  Pulses cap refill and sensation are intact distally.    Lab and Radiology Results No results found for this or any previous visit (from the past 72 hour(s)). DG Shoulder Right  Result Date: 12/09/2019 CLINICAL DATA:  Pain EXAM: RIGHT SHOULDER - 2+ VIEW COMPARISON:  December 06, 2018 FINDINGS: Oblique, Y scapular, axillary images were obtained. There is moderately severe narrowing of the glenohumeral joint. There is moderate narrowing of the acromioclavicular joint with mild bony overgrowth along the superior aspect of the distal clavicle. No fracture or dislocation. No erosive change or intra-articular calcification. Visualized right lung clear. 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 No fracture or dislocation. No erosive change. Electronically Signed   By: Lowella Grip III M.D.   On: 12/09/2019 11:56  I, [name], personally (independently)  visualized and performed the interpretation of the images attached in this note. 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  This is worse compared to x-ray obtained September 2020.  Procedure: Real-time Ultrasound Guided Injection of right shoulder glenohumeral joint Device: Philips Affiniti 50G Images permanently stored and available for review in PACS Verbal informed consent obtained.  Discussed risks and benefits of procedure. Warned about infection bleeding damage to structures skin hypopigmentation and fat atrophy among others. Patient expresses understanding and agreement Time-out conducted.   Noted no overlying erythema, induration, or other signs of local infection.   Skin prepped in a sterile fashion.   Local anesthesia: Topical Ethyl chloride.   With sterile technique and under real time ultrasound guidance:  40 mg of Kenalog and 2 mL of Marcaine injected into posterior aspect glenohumeral joint. Fluid seen entering the joint capsule.   Completed without difficulty   Pain immediately resolved suggesting accurate placement of the medication.   Advised to call if fevers/chills, erythema, induration, drainage, or persistent bleeding.   Images permanently stored and available for review in the ultrasound unit.  Impression: Technically successful ultrasound guided injection.     Assessment and Plan: 74 y.o. male with right shoulder pain chronic with recent exacerbation.  Pain due to worsening glenohumeral DJD.  He is receiving serial injections which tend to be lasting longer than 3 months.  Discussed that as his disease progresses he likely will start receiving less benefit from the injections and will ultimately require total shoulder replacement.  Discussed that right now he  is very active and functional and not having severe pain that we cannot control reasonable to continue current management.  However if worsening would recommend shoulder replacement while he still healthy enough  to benefit from it.   X-ray updated today showing worsening disease.  PDMP not reviewed this encounter. Orders Placed This Encounter  Procedures  . [place] LIMITED JOINT SPACE STRUCTURES UP RIGHT(NO LINKED CHARGES)    Standing Status:   Future    Number of Occurrences:   1    Standing Expiration Date:   06/07/2020    Order Specific Question:   Reason for Exam (SYMPTOM  OR DIAGNOSIS REQUIRED)    Answer:   Right shoulder pain    Order Specific Question:   Preferred imaging location?    Answer:   [place]  . DG Shoulder Right    Standing Status:   Future    Number of Occurrences:   1    Standing Expiration Date:   12/08/2020    Order Specific Question:   Reason for Exam (SYMPTOM  OR DIAGNOSIS REQUIRED)    Answer:   eval shoulder pain    Order Specific Question:   Preferred imaging location?    Answer:   [facility]    Order Specific Question:   Radiology Contrast Protocol - do NOT remove file path    Answer:   [email or web address]   No orders of the defined types were placed in this encounter.    Discussed warning signs or symptoms. Please see discharge instructions. Patient expresses understanding.   The above documentation has been reviewed and is accurate and complete [name], M.D.

## 2019-12-09 NOTE — Progress Notes (Signed)
Shoulder x-ray looks worse to radiology.  The rating the arthritis now is medium to severe which I agree with.  If these shots do not last longer than 3 months would recommend at least a consultation with orthopedic surgery to discuss total shoulder replacement options.  For now the shots are working let us keep doing them.

## 2019-12-11 ENCOUNTER — Ambulatory Visit: Payer: Medicare Other | Admitting: Physician Assistant

## 2020-01-28 ENCOUNTER — Encounter: Payer: Self-pay | Admitting: Cardiovascular Disease

## 2020-01-28 ENCOUNTER — Other Ambulatory Visit: Payer: Self-pay

## 2020-01-28 ENCOUNTER — Ambulatory Visit: Payer: Medicare Other | Admitting: Cardiovascular Disease

## 2020-01-28 DIAGNOSIS — I119 Hypertensive heart disease without heart failure: Secondary | ICD-10-CM

## 2020-01-28 DIAGNOSIS — E782 Mixed hyperlipidemia: Secondary | ICD-10-CM

## 2020-01-28 DIAGNOSIS — I2510 Atherosclerotic heart disease of native coronary artery without angina pectoris: Secondary | ICD-10-CM | POA: Diagnosis not present

## 2020-01-28 DIAGNOSIS — R000 Tachycardia, unspecified: Secondary | ICD-10-CM | POA: Diagnosis not present

## 2020-01-28 NOTE — Assessment & Plan Note (Signed)
History of tachycardia in the past with monitor that showed short runs of nonsustained ventricular tachycardia and atrial tachycardia placed on metoprolol by Dr. Curt Bears with marked improvement in his symptoms.

## 2020-01-28 NOTE — Assessment & Plan Note (Signed)
History of coronary artery disease status post orbital atherectomy, PCI drug-eluting stenting by Dr. Claiborne Billings 11/03/2010 of his LAD.  I performed repeat catheterization on him 08/31/2015 revealing a widely patent stent with no other significant CAD and normal LV function.  He did have a Myoview performed 03/23/2018 that showed diaphragmatic attenuation without ischemia.  He is an  athlete and exercises over an hour a day several times a week without chest pain.

## 2020-01-28 NOTE — Progress Notes (Signed)
01/28/2020 [name]   December 20, 1945  [id number]  Primary Physician [name], MD Primary Cardiologist: Lorretta Harp MD [name], [place]  HPI:  Nathan Rodriguez is a 74 y.o.  thin and fit-appearing married Caucasian male father of one daughter who is a retired Presenter, broadcasting. He was referred by [name] [facility] for cardiovascular evaluation because of progressive dyspnea .I last spoke to him on the phone for a virtual telemedicine phone visit 06/26/2018. He has a history of CAD status post rotational atherectomy, PCI and drug eluting stenting 2 by Dr. Gershon Mussel Kelly8/15/2012of his LAD. He is a Lawyer as noted progressive increasing dyspnea on exertion and decreased exercise tolerance over the last several weeks. This resected include hyperlipidemia not on statin therapy, history of tobacco abuse smoking 5-6 cigarettes a day off and on for the last 40 years.There is a family history of a father who had bypass surgery at age 18. I performed left heart cath on him 08/31/2015 revealing a widely patent stent, no significant CAD with normal LV function. Right after that he was diagnosed with a DVT and PE was placed on Xarelto. His symptoms of dyspnea resolved. He is being sent back now by [name] PA-C because of elevated heart rate with exercise and some dyspnea.  I obtained a 2D echocardiogram on him 03/23/2018 which was essentially normal and a Myoview stress test that showed diaphragmatic attenuation.  He did see Dr. Curt Bears in the office for tachycardia and an event monitor showed short runs of nonsustained ventricular tachycardia and atrial tachycardia.  He was prescribed metoprolol twice daily.  His tachycardic episodes have markedly improved.  He is no longer short of breath and is able to exercise without limitation.  He denies chest pain.  Our pharmacist Raquel is also working with him to initiate [place] therapy for his hyperlipidemia and statin  intolerance.  Since I spoke to him on the phone a year and a half ago he continues to do well.  He exercises several days a week up to an hour at a time with heart rates in the 150 range, occasional spikes in the 170 range.  He is complaining of some cold feet and some tingling probably related to diabetic peripheral neuropathy.  He said an excellent response to Repatha with an LDL measured at 68 on 06/05/2019.   Current Meds  Medication Sig  . Cholecalciferol (VITAMIN D-3 PO) Take 1 tablet by mouth daily.  . Coenzyme Q10 (CO Q 10 PO) Take by mouth daily.  . Cyanocobalamin (VITAMIN B-12 PO) Take 1 tablet by mouth daily.  Marland Kitchen ibuprofen (ADVIL,MOTRIN) 200 MG tablet Take 200 mg by mouth daily as needed for headache or moderate pain.  . magnesium gluconate (MAGONATE) 500 MG tablet Take 500 mg by mouth 1 day or 1 dose. Take 1/2 tablet once a day  . MELATONIN PO Take 1 tablet by mouth at bedtime.   . metoprolol tartrate (LOPRESSOR) 25 MG tablet Take 1.5 tablets (37.5 mg total) by mouth daily.  . Multiple Vitamin (MULTIVITAMIN WITH MINERALS) TABS tablet Take 1 tablet by mouth daily.  . Omega-3 Fatty Acids (OMEGA 3 PO) Take 1 capsule by mouth daily.  Marland Kitchen omeprazole (PRILOSEC) 20 MG capsule Take 20 mg by mouth daily.  Marland Kitchen REPATHA SURECLICK 875 MG/ML SOAJ INJECT 140 MG INTO THE SKIN EVERY 14 DAYS  . rivaroxaban (XARELTO) 20 MG TABS tablet Take 20 mg by mouth daily with supper.  . Wheat  Dextrin (BENEFIBER DRINK MIX PO) Take 1 ampule by mouth daily as needed. Pour contents of pouch into water bottle and drink once a day as needed for constipation     No Known Allergies  Social History   Socioeconomic History  . Marital status: Married    Spouse name: Not on file  . Number of children: Not on file  . Years of education: Not on file  . Highest education level: Not on file  Occupational History  . Not on file  Tobacco Use  . Smoking status: Former Smoker    Packs/day: 0.25    Years: 43.00    Pack  years: 10.75    Types: Cigarettes    Quit date: 03/21/2002    Years since quitting: 17.8  . Smokeless tobacco: Never Used  Substance and Sexual Activity  . Alcohol use: Yes    Comment: rare  . Drug use: No  . Sexual activity: Not on file  Other Topics Concern  . Not on file  Social History Narrative  . Not on file   Social Determinants of Health   Financial Resource Strain:   . Difficulty of Paying Living Expenses: Not on file  Food Insecurity:   . Worried About Charity fundraiser in the Last Year: Not on file  . Ran Out of Food in the Last Year: Not on file  Transportation Needs:   . Lack of Transportation (Medical): Not on file  . Lack of Transportation (Non-Medical): Not on file  Physical Activity:   . Days of Exercise per Week: Not on file  . Minutes of Exercise per Session: Not on file  Stress:   . Feeling of Stress : Not on file  Social Connections:   . Frequency of Communication with Friends and Family: Not on file  . Frequency of Social Gatherings with Friends and Family: Not on file  . Attends Religious Services: Not on file  . Active Member of Clubs or Organizations: Not on file  . Attends Archivist Meetings: Not on file  . Marital Status: Not on file  Intimate Partner Violence:   . Fear of Current or Ex-Partner: Not on file  . Emotionally Abused: Not on file  . Physically Abused: Not on file  . Sexually Abused: Not on file     Review of Systems: General: negative for chills, fever, night sweats or weight changes.  Cardiovascular: negative for chest pain, dyspnea on exertion, edema, orthopnea, palpitations, paroxysmal nocturnal dyspnea or shortness of breath Dermatological: negative for rash Respiratory: negative for cough or wheezing Urologic: negative for hematuria Abdominal: negative for nausea, vomiting, diarrhea, bright red blood per rectum, melena, or hematemesis Neurologic: negative for visual changes, syncope, or dizziness All other  systems reviewed and are otherwise negative except as noted above.    Blood pressure 130/88, pulse (!) 55, height 5' 10.5" (1.791 m), weight 160 lb (72.6 kg).  General appearance: alert and no distress Neck: no adenopathy, no carotid bruit, no JVD, supple, symmetrical, trachea midline and thyroid not enlarged, symmetric, no tenderness/mass/nodules Lungs: clear to auscultation bilaterally Heart: regular rate and rhythm, S1, S2 normal, no murmur, click, rub or gallop Extremities: extremities normal, atraumatic, no cyanosis or edema Pulses: 2+ and symmetric Skin: Skin color, texture, turgor normal. No rashes or lesions Neurologic: Alert and oriented X 3, normal strength and tone. Normal symmetric reflexes. Normal coordination and gait  EKG not performed today  ASSESSMENT AND PLAN:   Coronary artery disease involving native  coronary artery of native heart without angina pectoris History of coronary artery disease status post orbital atherectomy, PCI drug-eluting stenting by Dr. Claiborne Billings 11/03/2010 of his LAD.  I performed repeat catheterization on him 08/31/2015 revealing a widely patent stent with no other significant CAD and normal LV function.  He did have a Myoview performed 03/23/2018 that showed diaphragmatic attenuation without ischemia.  He is an  athlete and exercises over an hour a day several times a week without chest pain.  Hyperlipidemia History of hyperlipidemia on Repatha with lipid profile performed 06/05/2019 revealing total cholesterol 143, LDL 68 and HDL of 60.  Hypertensive heart disease History of essential hypertension a blood pressure measured today 130/88.  He is on metoprolol.  Tachycardia History of tachycardia in the past with monitor that showed short runs of nonsustained ventricular tachycardia and atrial tachycardia placed on metoprolol by Dr. Curt Bears with marked improvement in his symptoms.      Lorretta Harp MD FACP,FACC,FAHA, [facility] 01/28/2020 10:41 AM

## 2020-01-28 NOTE — Patient Instructions (Signed)

## 2020-01-28 NOTE — Assessment & Plan Note (Signed)
History of hyperlipidemia on Repatha with lipid profile performed 06/05/2019 revealing total cholesterol 143, LDL 68 and HDL of 60.

## 2020-01-28 NOTE — Assessment & Plan Note (Signed)
History of essential hypertension a blood pressure measured today 130/88.  He is on metoprolol.

## 2020-03-09 NOTE — Progress Notes (Signed)
I, Nathan Rodriguez, LAT, ATC, am serving as scribe for Dr. [name].  Nathan Rodriguez is a 74 y.o. male who presents to [place] at [facility] today for continued R shoulder pain. Pt was last seen by [name] on 12/09/19 and had a R GHJ injection and was advised that as the DJD worsens he is likely going to need a shoulder replacement. Today, pt reports that his R shoulder pain is doing fairly well but feels it may be better to get his regularly scheduled injection instead of waiting til he has a problem .  Dx imaging: 12/09/19 R shoulder XR   Pertinent review of systems: No fevers or chills  Relevant historical information: Hypertension, history of DVT   Exam:  BP 124/84 (BP Location: Left Arm, Patient Position: Sitting, Cuff Size: Normal)   Pulse 64   Ht 5' 10.5" (1.791 m)   Wt 166 lb 3.2 oz (75.4 kg)   SpO2 98%   BMI 23.51 kg/m  General: Well Developed, well nourished, and in no acute distress.   MSK: Right shoulder normal-appearing decreased motion.    Lab and Radiology Results  EXAM: RIGHT SHOULDER - 2+ VIEW  COMPARISON:  December 06, 2018  FINDINGS: Oblique, Y scapular, axillary images were obtained. There is moderately severe narrowing of the glenohumeral joint. There is moderate narrowing of the acromioclavicular joint with mild bony overgrowth along the superior aspect of the distal clavicle. No fracture or dislocation. No erosive change or intra-articular calcification. Visualized right lung clear.  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 No fracture or dislocation. No erosive change.   Electronically Signed   By: Lowella Grip III M.D.   On: 12/09/2019 11:56  I, [name], personally (independently) visualized and performed the interpretation of the images attached in this note.  Procedure: Real-time Ultrasound Guided Injection of right shoulder posterior glenohumeral joint Device:  Philips Affiniti 50G Images permanently stored and available for review in PACS Verbal informed consent obtained.  Discussed risks and benefits of procedure. Warned about infection bleeding damage to structures skin hypopigmentation and fat atrophy among others. Patient expresses understanding and agreement Time-out conducted.   Noted no overlying erythema, induration, or other signs of local infection.   Skin prepped in a sterile fashion.   Local anesthesia: Topical Ethyl chloride.   With sterile technique and under real time ultrasound guidance:  40 mg of Kenalog and 2 mL of Marcaine injected into joint capsule. Fluid seen entering the joint capsule.   Completed without difficulty   Pain immediately resolved suggesting accurate placement of the medication.   Advised to call if fevers/chills, erythema, induration, drainage, or persistent bleeding.   Images permanently stored and available for review in the ultrasound unit.  Impression: Technically successful ultrasound guided injection.         Assessment and Plan: 74 y.o. 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  Plan for repeat shoulder injection.  Ultimately patient will require total shoulder replacement but for now reasonable to continue these steroid injections every 3 months as they are working.  Continue conservative management including home exercise program.  Recheck back with me as needed.   PDMP not reviewed this encounter. Orders Placed This Encounter  Procedures  . [place] LIMITED JOINT SPACE STRUCTURES UP RIGHT(NO LINKED CHARGES)    Order Specific Question:   Reason for Exam (SYMPTOM  OR DIAGNOSIS REQUIRED)    Answer:   R shoulder pain  Order Specific Question:   Preferred imaging location?    Answer:   [place]   No orders of the defined types were placed in this encounter.    Discussed warning signs or symptoms. Please see discharge instructions. Patient expresses  understanding.   The above documentation has been reviewed and is accurate and complete [name], M.D.

## 2020-03-10 ENCOUNTER — Encounter: Payer: Self-pay | Admitting: Family Medicine

## 2020-03-10 ENCOUNTER — Other Ambulatory Visit: Payer: Self-pay

## 2020-03-10 ENCOUNTER — Ambulatory Visit: Payer: Medicare Other | Admitting: Family Medicine

## 2020-03-10 ENCOUNTER — Ambulatory Visit: Payer: Self-pay

## 2020-03-10 VITALS — BP 124/84 | HR 64 | Ht 70.5 in | Wt 166.2 lb

## 2020-03-10 DIAGNOSIS — M19011 Primary osteoarthritis, right shoulder: Secondary | ICD-10-CM

## 2020-03-10 DIAGNOSIS — G8929 Other chronic pain: Secondary | ICD-10-CM | POA: Diagnosis not present

## 2020-03-10 DIAGNOSIS — M25511 Pain in right shoulder: Secondary | ICD-10-CM | POA: Diagnosis not present

## 2020-03-10 NOTE — Patient Instructions (Addendum)
You had a R shoulder injection today.  Call or go to the ER if you develop a large red swollen joint with extreme pain or oozing puss.   Recheck as needed.

## 2020-05-22 ENCOUNTER — Telehealth: Payer: Self-pay

## 2020-05-22 NOTE — Telephone Encounter (Signed)
Patient called, left message and would like to speak with Haleigh.  Did not mention specifics.

## 2020-05-25 NOTE — Telephone Encounter (Signed)
Lm for pt call back

## 2020-05-26 NOTE — Telephone Encounter (Signed)
Pt returned a call stating that he needed assistance w/diabetes management so we scheduled an appointment w/chris pavero rph. Pt voiced understanding

## 2020-05-28 ENCOUNTER — Other Ambulatory Visit: Payer: Self-pay

## 2020-05-28 ENCOUNTER — Encounter: Payer: Self-pay | Admitting: Pharmacist

## 2020-05-28 ENCOUNTER — Ambulatory Visit (INDEPENDENT_AMBULATORY_CARE_PROVIDER_SITE_OTHER): Payer: Medicare Other | Admitting: Pharmacist

## 2020-05-28 VITALS — BP 122/78 | HR 71 | Wt 162.6 lb

## 2020-05-28 DIAGNOSIS — R7303 Prediabetes: Secondary | ICD-10-CM

## 2020-05-28 NOTE — Progress Notes (Signed)
Patient ID: [name]                 DOB: March 11, 1946                      MRN: [id number]     HPI: Nathan Rodriguez is a 75 y.o. male who self referred for DM education. PMH is significant for CAD, HTN, hx of PE, and pre diabetes.  Has been following a strict diet and exercise routine to prevent T2DM and would like to know if he is on the right track.  Patient's most recent A1c was 6.0 from primary care and patient has been worried about becoming diabetic.  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  Is physically active for ~4 hours each day.  Follow a fairly strict low carbohydrate diet and focuses on proteins and vegetables and only drinks water or unsweetened tea.    Has been taking dietary supplements he saw on television which advertise themselves as an entires days worth of fruit and vegetables in a capsule.  He says they are 60 dollars a bottle.  Patient does not want to start anti diabetic medications  Current DM meds: N/a  Family History: Father - DM  Wt Readings from Last 3 Encounters:  05/28/20 162 lb 9.6 oz (73.8 kg)  03/10/20 166 lb 3.2 oz (75.4 kg)  01/28/20 160 lb (72.6 kg)   BP Readings from Last 3 Encounters:  05/28/20 122/78  03/10/20 124/84  01/28/20 130/88   Pulse Readings from Last 3 Encounters:  05/28/20 71  03/10/20 64  01/28/20 (!) 55    Renal function: CrCl cannot be calculated (Patient's most recent lab result is older than the maximum 21 days allowed.).  Past Medical History:  Diagnosis Date  . Arthritis   . CAD (coronary artery disease)   . Complication of anesthesia    hard time to wake up  . Coronary artery disease    a. 2012  s/p prior DES x 2 to the LAD;  b. 05/2015 nl stress test [facility]);  c. 6/20217 Cath: LM nl, LAD patent stents, LCX/RCA nl, EF 55-65%.  . DDD (degenerative disc disease)   . Dyspnea on exertion    a. has been seen by pulmonology - reportedly told that everything nl.  . Factor V deficiency  ([place])   . GERD (gastroesophageal reflux disease)   . Hypercholesteremia   . Hypertensive heart disease   . Nocturia   . Occipital headache    sight changes without migraines, for over 30  years  . Pulmonary embolism ([place])   . Scoliosis   . Sleep apnea    mild, CPAP  . Tinnitus     Current Outpatient Medications on File Prior to Visit  Medication Sig Dispense Refill  . Cholecalciferol (VITAMIN D-3 PO) Take 1 tablet by mouth daily.    . Coenzyme Q10 (CO Q 10 PO) Take by mouth daily.    . Cyanocobalamin (VITAMIN B-12 PO) Take 1 tablet by mouth daily.    Marland Kitchen ibuprofen (ADVIL,MOTRIN) 200 MG tablet Take 200 mg by mouth daily as needed for headache or moderate pain.    . magnesium gluconate (MAGONATE) 500 MG tablet Take 500 mg by mouth 1 day or 1 dose. Take 1/2 tablet once a day    . MELATONIN PO Take 1 tablet by mouth at bedtime.     . metoprolol tartrate (LOPRESSOR) 25 MG tablet Take 1.5 tablets (  37.5 mg total) by mouth daily. 45 tablet 6  . Multiple Vitamin (MULTIVITAMIN WITH MINERALS) TABS tablet Take 1 tablet by mouth daily.    . Omega-3 Fatty Acids (OMEGA 3 PO) Take 1 capsule by mouth daily.    Marland Kitchen omeprazole (PRILOSEC) 20 MG capsule Take 20 mg by mouth daily.    Marland Kitchen REPATHA SURECLICK 921 MG/ML SOAJ INJECT 140 MG INTO THE SKIN EVERY 14 DAYS 2 mL 11  . rivaroxaban (XARELTO) 20 MG TABS tablet Take 20 mg by mouth daily with supper.    . Wheat Dextrin (BENEFIBER DRINK MIX PO) Take 1 ampule by mouth daily as needed. Pour contents of pouch into water bottle and drink once a day as needed for constipation     No current facility-administered medications on file prior to visit.    No Known Allergies   Assessment/Plan:  1. DM - Patient very concerned about becoming diabetic due to family history.  However is very physically active and follows a strict diet.  Explained the pathophysiology of T2DM and insulin resistance.  Gave Planning Healthy Meals handout and recommended patient continue his  low carbohydrate diet.  Concentrate on protein, vegetables, fats, and whole grains.  Continue exercise routine.  Advised to discontinue supplements as they have no clinical benefit and would prefer he eat actual vegetables than capsules.    Patient voiced understanding.  Karren Cobble, PharmD, BCACP, [place], [place] [address] [place], [place] [address] Phone: [phone]; Fax: [phone] 05/28/2020 6:12 PM

## 2020-05-28 NOTE — Patient Instructions (Addendum)
It was nice meeting you today!  We would like to keep your blood pressure less than 130/80 and your A1c less than 6.5  Continue your exercise and diet routine.  Try to exercise at least 30 minutes a day at least 5 days a week.  Concentrate on proteins, vegetables, healthy fats, and whole grains while minimizing carbohydrates  Karren Cobble, PharmD, BCACP, CDCES, [place] [address] [place], [place] [address] Phone: [phone]; Fax: [phone] 05/28/2020 2:44 PM

## 2020-06-15 ENCOUNTER — Other Ambulatory Visit: Payer: Self-pay

## 2020-06-15 MED ORDER — REPATHA SURECLICK 140 MG/ML ~~LOC~~ SOAJ: SUBCUTANEOUS | 11 refills | Status: DC

## 2020-06-17 NOTE — Progress Notes (Signed)
I, [name], LAT, ATC, am serving as scribe for Dr. [name].  Nathan Rodriguez is a 75 y.o. male who presents to [place] at [facility] today for f/u of R shoulder pain.  He was last seen by [name] on 03/10/21 and had a R GHJ injection.  Since his last visit, pt reports that his R shoulder pain has been flared up for about one month.  He states that the last injection didn't work as well as the one before that.  Exercise is very important to Nathan Rodriguez.  He notes that he does about 30 push-ups every day and that is important to continue.  Diagnostic testing: R shoulder XR- 12/09/19, 12/06/18, 04/18/18  Pertinent review of systems: No fevers or chills  Relevant historical information: History of DVT.  Coronary artery disease.   Exam:  BP 112/78 (BP Location: Left Arm, Patient Position: Sitting, Cuff Size: Normal)   Pulse 61   Ht 5' 10.5" (1.791 m)   Wt 161 lb 9.6 oz (73.3 kg)   SpO2 99%   BMI 22.86 kg/m  General: Well Developed, well nourished, and in no acute distress.   MSK: Right shoulder normal-appearing normal motion however crepitation present with range of motion    Lab and Radiology Results  Procedure: Real-time Ultrasound Guided Injection of right shoulder posterior glenohumeral joint Device: Philips Affiniti 50G Images permanently stored and available for review in PACS Verbal informed consent obtained.  Discussed risks and benefits of procedure. Warned about infection bleeding damage to structures skin hypopigmentation and fat atrophy among others. Patient expresses understanding and agreement Time-out conducted.   Noted no overlying erythema, induration, or other signs of local infection.   Skin prepped in a sterile fashion.   Local anesthesia: Topical Ethyl chloride.   With sterile technique and under real time ultrasound guidance:  40 mg of Kenalog and 2 mL of Marcaine injected into shoulder joint. Fluid seen entering the joint capsule.    Completed without difficulty   Pain immediately resolved suggesting accurate placement of the medication.   Advised to call if fevers/chills, erythema, induration, drainage, or persistent bleeding.   Images permanently stored and available for review in the ultrasound unit.  Impression: Technically successful ultrasound guided injection.    EXAM: RIGHT SHOULDER - 2+ VIEW  COMPARISON:  December 06, 2018  FINDINGS: Oblique, Y scapular, axillary images were obtained. There is moderately severe narrowing of the glenohumeral joint. There is moderate narrowing of the acromioclavicular joint with mild bony overgrowth along the superior aspect of the distal clavicle. No fracture or dislocation. No erosive change or intra-articular calcification. Visualized right lung clear.  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 No fracture or dislocation. No erosive change.   Electronically Signed   By: Lowella Grip III M.D.   On: 12/09/2019 11:56  I, [name], personally (independently) visualized and performed the interpretation of the images attached in this note.       Assessment and Plan: 75 y.o. 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  Patient is slowly worsening.  The steroid injections are starting to not last as long as we need them to.  Plan to repeat steroid injection today but I spent time during the visit today discussing potential treatment plan and options in the future.  Ultimately he will likely need total shoulder replacement.  He like to discuss other options.  PRP injection is an option although it is not very well supported  by the evidence especially for the shoulder.  However I do feel that it is at least safe and a reasonable thing to try should the steroid injections stop working.  Ultimately I do not think anything is going to work and he likely will require total shoulder replacement.  Recheck back as needed.   PDMP  not reviewed this encounter. Orders Placed This Encounter  Procedures  . [place] LIMITED JOINT SPACE STRUCTURES UP RIGHT(NO LINKED CHARGES)    Order Specific Question:   Reason for Exam (SYMPTOM  OR DIAGNOSIS REQUIRED)    Answer:   R shoulder pain    Order Specific Question:   Preferred imaging location?    Answer:   [place]   No orders of the defined types were placed in this encounter.    Discussed warning signs or symptoms. Please see discharge instructions. Patient expresses understanding.   The above documentation has been reviewed and is accurate and complete [name], M.D.

## 2020-06-18 ENCOUNTER — Ambulatory Visit: Payer: Medicare Other | Admitting: Family Medicine

## 2020-06-18 ENCOUNTER — Ambulatory Visit: Payer: Self-pay

## 2020-06-18 ENCOUNTER — Other Ambulatory Visit: Payer: Self-pay

## 2020-06-18 ENCOUNTER — Encounter: Payer: Self-pay | Admitting: Family Medicine

## 2020-06-18 VITALS — BP 112/78 | HR 61 | Ht 70.5 in | Wt 161.6 lb

## 2020-06-18 DIAGNOSIS — M25511 Pain in right shoulder: Secondary | ICD-10-CM

## 2020-06-18 DIAGNOSIS — G8929 Other chronic pain: Secondary | ICD-10-CM

## 2020-06-18 NOTE — Patient Instructions (Addendum)
You had a R shoulder joint injection today.  Call or go to the ER if you develop a large red swollen joint with extreme pain or oozing puss.   PRP is the next step.  If you would like to proceed with that option, please let the front desk know when you call to schedule.  The cost is all out-of-pocket and is around $500.00

## 2020-07-03 IMAGING — DX DG SHOULDER 2+V*R*
3 series · 3 of 3 positions shown · non-contrast
Comparison: None.

CLINICAL DATA: Chronic right shoulder pain

EXAM:
RIGHT SHOULDER - 2+ VIEW

[grashey]
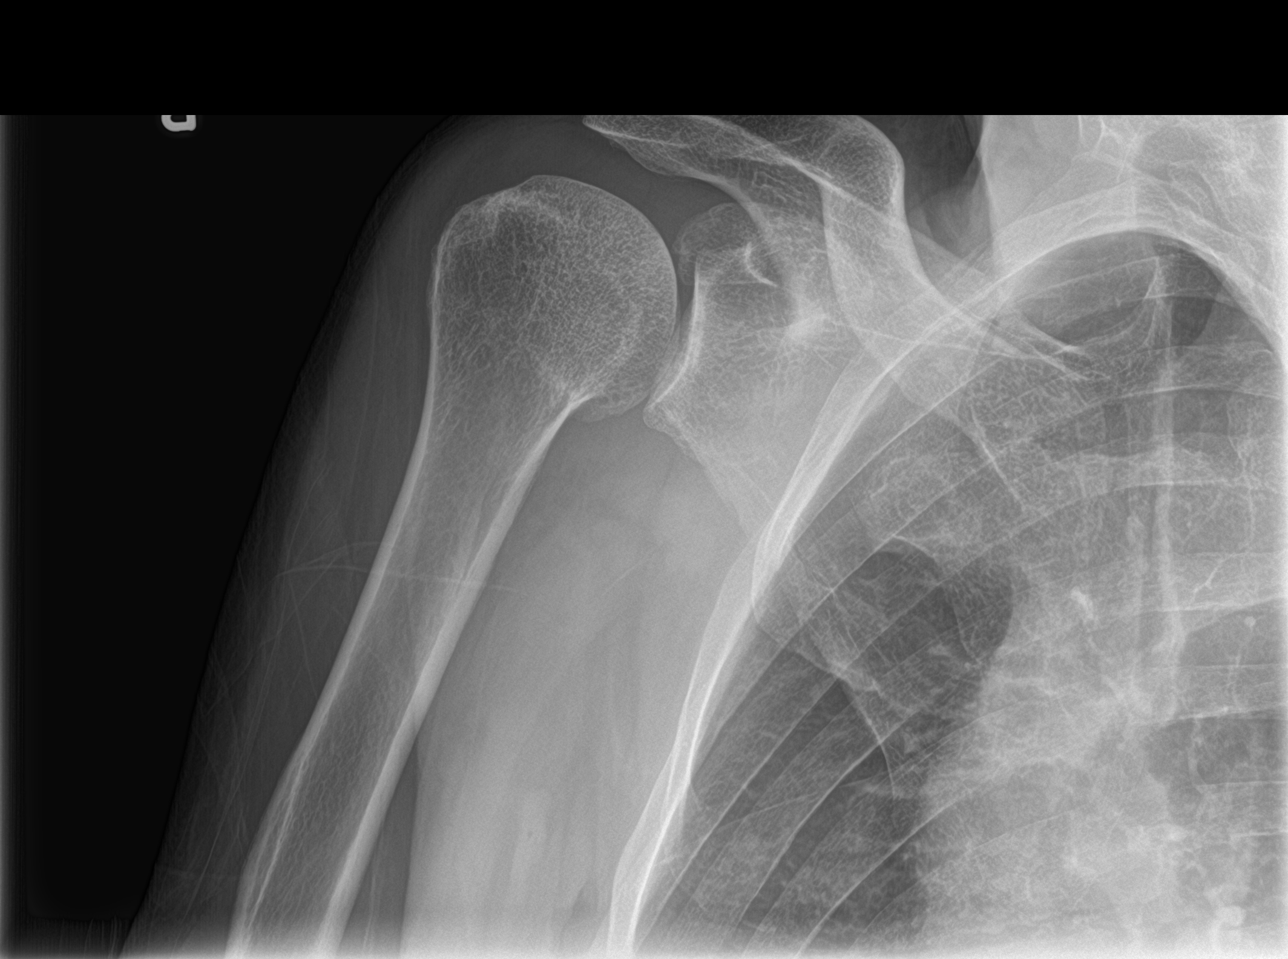

[y view]
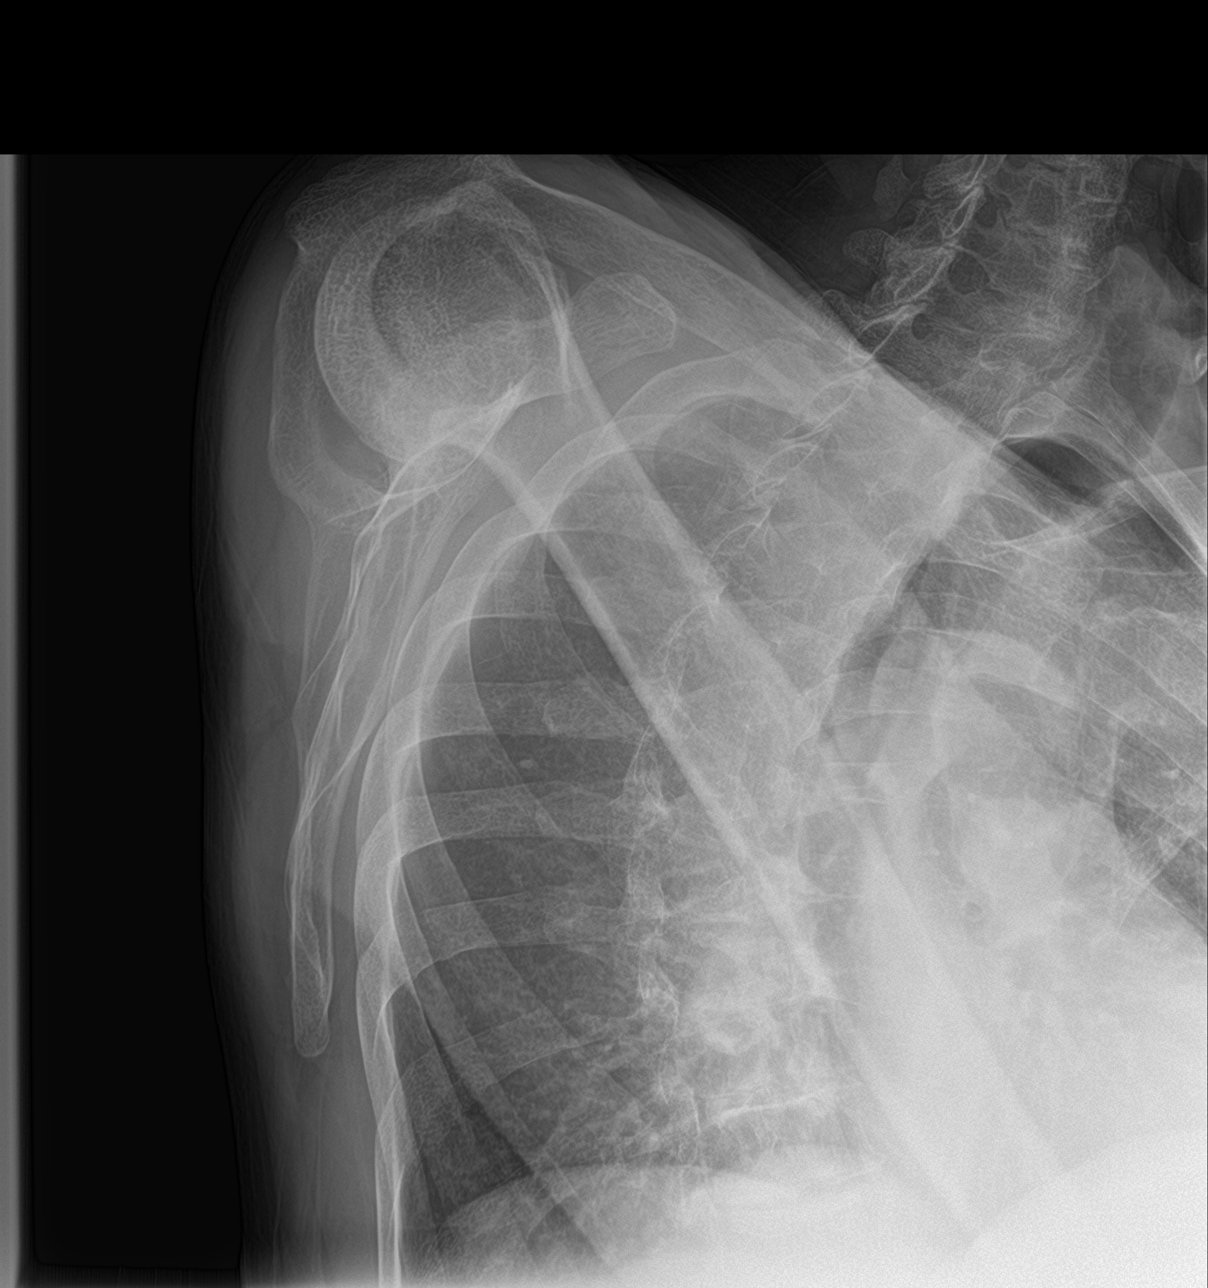

[shoulder axial]
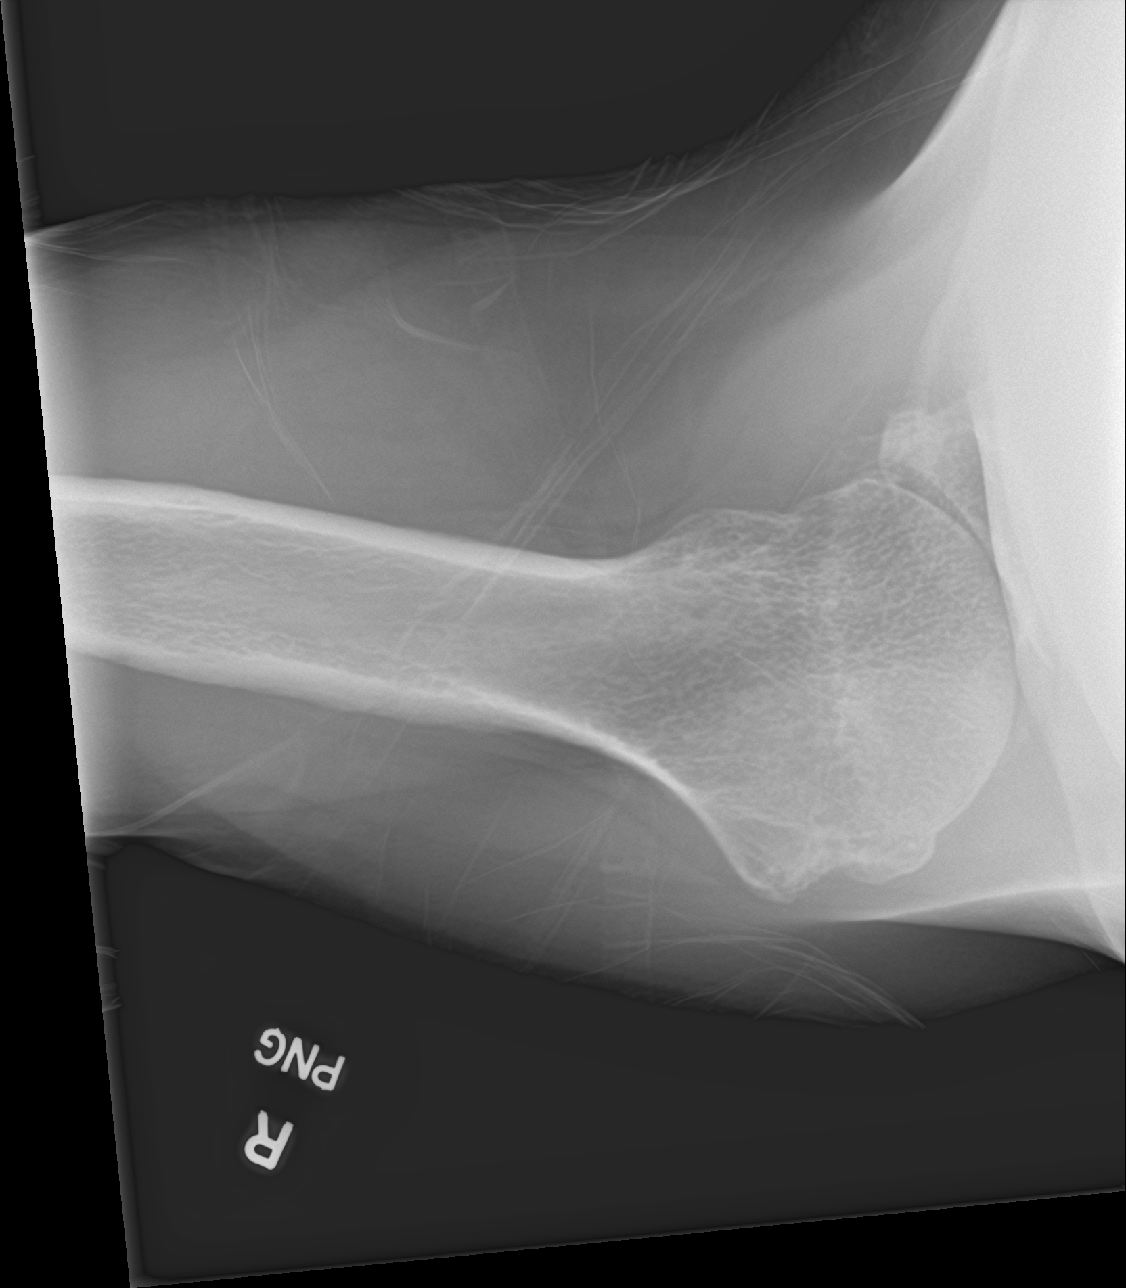

[3 of 3 positions shown; findings below may reference images not displayed]

FINDINGS: Mild degenerative changes in the right AC and glenohumeral joints
with joint space narrowing. No acute bony abnormality. Specifically,
no fracture, subluxation, or dislocation.
IMPRESSION: Mild osteoarthritis.  No acute findings.

## 2020-07-13 ENCOUNTER — Other Ambulatory Visit: Payer: Self-pay | Admitting: Orthopedic Surgery

## 2020-07-13 DIAGNOSIS — M19211 Secondary osteoarthritis, right shoulder: Secondary | ICD-10-CM

## 2020-07-29 ENCOUNTER — Ambulatory Visit
Admission: RE | Admit: 2020-07-29 | Discharge: 2020-07-29 | Disposition: A | Discharge status: Home / Self Care | Payer: Medicare Other | Source: Ambulatory Visit | Attending: Orthopedic Surgery | Admitting: Orthopedic Surgery

## 2020-07-29 ENCOUNTER — Other Ambulatory Visit: Payer: Self-pay

## 2020-07-29 DIAGNOSIS — M19211 Secondary osteoarthritis, right shoulder: Secondary | ICD-10-CM

## 2020-08-05 ENCOUNTER — Other Ambulatory Visit: Payer: Self-pay

## 2020-08-05 ENCOUNTER — Encounter: Payer: Self-pay | Admitting: Family Medicine

## 2020-08-05 ENCOUNTER — Ambulatory Visit (INDEPENDENT_AMBULATORY_CARE_PROVIDER_SITE_OTHER): Payer: Medicare Other | Admitting: Family Medicine

## 2020-08-05 DIAGNOSIS — M5136 Other intervertebral disc degeneration, lumbar region: Secondary | ICD-10-CM

## 2020-08-05 NOTE — Progress Notes (Signed)
Virtual Visit  via phone note   I connected with Nathan Rodriguez today by a video enabled telemedicine application and verified that I am speaking with the correct person using two identifiers.  ? Location of the provider office ? Location of the patient home ? The names and roles of all persons participating in the visit.  Patient and myself   I discussed the limitations, risks, security and privacy concerns of performing an evaluation and management service by telephone and the availability of in person appointments. I also discussed with the patient that there may be a patient responsible charge related to this service. The patient expressed understanding and agreed to proceed.    I discussed the limitations of evaluation and management by telemedicine and the availability of in person appointments. The patient expressed understanding and agreed to proceed.  History of Present Illness: Nathan Rodriguez is a 75 y.o. male who would like to discuss back and hip pain.  I last saw pt on 06/18/20 for R shoulder pain f/u.  Prior to that, pt saw Dr. Tamala Julian for low back pain and had a L L2 nerve root block and transforaminal epidural on 08/21/19.  Patient notes the pain that he has had in his left hip buttocks and anterior hip pain that he had last year that was well treated with an epidural steroid injection at L2 nerve root has returned a little bit and he would like to proceed with repeat injection.  These have worked very well in the past.  First injection was 2020.  He does take Eliquis.  He has a remote history of DVT.  Last DVT was 2 or 3 years ago.  He does not take any other blood thinners.   Observations/Objective: Exam: Normal Speech.    Lab and Radiology Results  EXAM: MRI LUMBAR SPINE WITHOUT CONTRAST  TECHNIQUE: Multiplanar, multisequence MR imaging of the lumbar spine was performed. No intravenous contrast was administered.  COMPARISON:  Lumbar spine radiographs  04/18/2018, report for lumbar spine MRI 07/05/2003 (images unavailable)  FINDINGS: Segmentation: For the purposes of this dictation, five lumbar vertebrae are assumed and the caudal most well-formed intervertebral disc is designated L5-S1.  Alignment: Lumbar dextrocurvature. Trace retrolisthesis at the T12-L1 through L2-L3 levels. Trace L4-L5 anterolisthesis.  Vertebrae: Vertebral body height is maintained. Multilevel degenerative endplate irregularity. Multilevel degenerative endplate edema which is prominent L2-L3. Ventral osteophytes.  Conus medullaris and cauda equina: Conus extends to the L1-2 level. Conus and cauda equina appear normal.  Paraspinal and other soft tissues: The imaged lumbar paraspinal muscles are unremarkable. Incompletely assessed right lower pole renal cyst.  Disc levels:  T12-L1: Moderate disc degeneration. Small disc bulge asymmetric to the right with associated osteophyte ridge. Mild right subarticular and right neural foraminal narrowing. No significant central canal stenosis.  L1-L2: Moderate disc degeneration. Disc bulge with osteophyte ridge. Facet arthrosis/ligamentum flavum hypertrophy. Mild central canal and bilateral subarticular narrowing. Mild bilateral neural foraminal narrowing.  L2-L3: Severe disc degeneration. Disc bulge with osteophyte ridge. Facet arthrosis/ligamentum flavum hypertrophy. Moderate central canal stenosis and left subarticular narrowing. Moderate/severe left neural foraminal narrowing.  L3-L4: Disc bulge. Facet arthrosis/ligamentum flavum hypertrophy. Mild left subarticular and central canal narrowing. Mild/moderate left neural foraminal narrowing.  L4-L5: Small disc bulge. Facet arthrosis/ligamentum flavum hypertrophy. Mild central canal and bilateral subarticular narrowing. Mild bilateral neural foraminal narrowing.  L5-S1: Disc bulge asymmetric to the right with osteophyte ridge. Facet  arthrosis/ligamentum flavum hypertrophy. No significant spinal canal stenosis. Moderate/severe right  with mild left neural foraminal narrowing.  IMPRESSION: Lumbar spondylosis as detailed. At L2-L3, there is severe disc degeneration with prominent degenerative endplate edema. Moderate central canal and left subarticular narrowing at this level, as well as moderate/severe left neural foraminal narrowing.  No more than mild spinal canal stenosis at the remaining levels. Additional sites of neural foraminal narrowing, as described, and greatest on the left at L5-S1 (moderate/severe at this site).   Electronically Signed   By: Kellie Simmering   On: 10/23/2018 10:27  Assessment and Plan: 75 y.o. male with lumbar radiculopathy at left L2.  She has a history of doing well with L2 nerve root block and epidural steroid injection left side.  Plan for repeat epidural steroid injection at this level.  Recommend stopping Eliquis 24 hours prior to the injection and remaining off of it for 48 hours after the injection.  Provided patient with phone number for [facility] imaging to call and schedule the appointment.  Check back with me as needed.  Of note I attempted to call patient multiple times starting at around 1130 for this appointment.  His phone number was busy the entire time.  Eventually at about noon he called the office.  He notes that he is switching from providers and is having some technical difficulty.  He does not have an alternative phone number.  Follow Up Instructions:    I discussed the assessment and treatment plan with the patient. The patient was provided an opportunity to ask questions and all were answered. The patient agreed with the plan and demonstrated an understanding of the instructions.   The patient was advised to call back or seek an in-person evaluation if the symptoms worsen or if the condition fails to improve as anticipated.  Time: 11 minutes discussion with  patient

## 2020-08-13 ENCOUNTER — Other Ambulatory Visit: Payer: Self-pay | Admitting: Family Medicine

## 2020-08-24 ENCOUNTER — Ambulatory Visit
Admission: RE | Admit: 2020-08-24 | Discharge: 2020-08-24 | Disposition: A | Discharge status: Home / Self Care | Payer: Medicare Other | Source: Ambulatory Visit | Attending: Family Medicine | Admitting: Family Medicine

## 2020-08-24 ENCOUNTER — Other Ambulatory Visit: Payer: Self-pay

## 2020-08-24 DIAGNOSIS — M5136 Other intervertebral disc degeneration, lumbar region: Secondary | ICD-10-CM

## 2020-08-24 MED ORDER — METHYLPREDNISOLONE ACETATE 40 MG/ML INJ SUSP (RADIOLOG
80.0000 mg | Freq: Once | INTRAMUSCULAR | Status: AC
  Administered 2020-08-24: 80 mg via EPIDURAL

## 2020-08-24 MED ORDER — IOPAMIDOL (ISOVUE-M 300) INJECTION 61%
1.0000 mL | Freq: Once | INTRAMUSCULAR | Status: AC | PRN
  Administered 2020-08-24: 1 mL via EPIDURAL

## 2020-08-24 NOTE — Discharge Instructions (Signed)
Post Procedure Spinal Discharge Instruction Sheet  1. You may resume a regular diet and any medications that you routinely take (including pain medications).  2. No driving day of procedure.  3. Light activity throughout the rest of the day.  Do not do any strenuous work, exercise, bending or lifting.  The day following the procedure, you can resume normal physical activity but you should refrain from exercising or physical therapy for at least three days thereafter.   Common Side Effects:   Headaches- take your usual medications as directed by your physician.  Increase your fluid intake.  Caffeinated beverages may be helpful.  Lie flat in bed until your headache resolves.   Restlessness or inability to sleep- you may have trouble sleeping for the next few days.  Ask your referring physician if you need any medication for sleep.   Facial flushing or redness- should subside within a few days.   Increased pain- a temporary increase in pain a day or two following your procedure is not unusual.  Take your pain medication as prescribed by your referring physician.   Leg cramps  Please contact our office at [phone] for the following symptoms:  Fever greater than 100 degrees.  Headaches unresolved with medication after 2-3 days.  Increased swelling, pain, or redness at injection site.   Thank you for visiting [facility] today.   YOU MAY RESUME YOUR XARELTO 24 HOURS AFTER INJECTION (08/25/20 AT 1:30 PM)

## 2020-09-15 ENCOUNTER — Other Ambulatory Visit: Payer: Self-pay | Admitting: Orthopedic Surgery

## 2020-09-15 DIAGNOSIS — Z01811 Encounter for preprocedural respiratory examination: Secondary | ICD-10-CM

## 2020-09-16 ENCOUNTER — Telehealth: Payer: Self-pay | Admitting: *Deleted

## 2020-09-16 NOTE — Telephone Encounter (Signed)
   Name: Nathan Rodriguez  DOB: 1945/08/09  MRN: [id number]   Primary Cardiologist: Quay Burow, MD  Chart reviewed as part of pre-operative protocol coverage. Patient was contacted 09/16/2020 in reference to pre-operative risk assessment for pending surgery as outlined below.  FABRIZZIO MARCELLA was last seen on  01/28/20 by Dr. Gwenlyn Found.  Since that day, KELDEN LAVALLEE has done well. He can complete more than 4.0 METS (biking, walking).   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 Please reach out to them for [facility] hold.  Therefore, based on ACC/AHA guidelines, the patient would be at acceptable risk for the planned procedure without further cardiovascular testing.   The patient was advised that if he develops new symptoms prior to surgery to contact our office to arrange for a follow-up visit, and he verbalized understanding.  I will route this recommendation to the requesting party via Epic fax function and remove from pre-op pool. Please call with questions.  Tami Lin Modestine Scherzinger, PA 09/16/2020, 1:39 PM

## 2020-09-16 NOTE — Telephone Encounter (Signed)
   [facility] Pre-operative Risk Assessment    Patient Name: Nathan Rodriguez  DOB: [birth year]  MRN: [id number]     Request for surgical clearance:  What type of surgery is being performed? Right total shoulder arthoplasty    When is this surgery scheduled? 10/08/20   What type of clearance is required (medical clearance vs. Pharmacy clearance to hold med vs. Both)? Medical   Are there any medications that need to be held prior to surgery and how long? Xarelto 20 mg    Practice name and name of physician performing surgery? [facility] : Dr Lowella Petties   What is the office phone number? [phone] attn judy daniels   7.   What is the office fax number? [place]  attn: Marcelo Baldy  8.   Anesthesia type (None, local, MAC, general) ? choice   Nathan Rodriguez 09/16/2020, 10:37 AM  _________________________________________________________________   (provider comments below)

## 2020-09-28 NOTE — Patient Instructions (Signed)
DUE TO COVID-19 ONLY ONE VISITOR IS ALLOWED TO COME WITH YOU AND STAY IN THE WAITING ROOM ONLY DURING PRE OP AND PROCEDURE DAY OF SURGERY. THE 1 VISITOR  MAY VISIT WITH YOU AFTER SURGERY IN YOUR PRIVATE ROOM DURING VISITING HOURS ONLY!                [name]   Your procedure is scheduled on: 10/08/20   Report to [facility] Main  Entrance   Report to admitting at: 10:00 AM     Call this number if you have problems the morning of surgery [phone]    Remember:  NO SOLID FOOD AFTER MIDNIGHT THE NIGHT PRIOR TO SURGERY. NOTHING BY MOUTH EXCEPT CLEAR LIQUIDS UNTIL: 9:30 AM . PLEASE FINISH ENSURE DRINK PER SURGEON ORDER  WHICH NEEDS TO BE COMPLETED AT : 9:30 AM.   CLEAR LIQUID DIET  Foods Allowed                                                                     Foods Excluded  Coffee and tea, regular and decaf                             liquids that you cannot  Plain Jell-O any favor except red or purple                                           see through such as: Fruit ices (not with fruit pulp)                                     milk, soups, orange juice  Iced Popsicles                                    All solid food Carbonated beverages, regular and diet                                    Cranberry, grape and apple juices Sports drinks like Gatorade Lightly seasoned clear broth or consume(fat free) Sugar, honey syrup  Sample Menu Breakfast                                Lunch                                     Supper Cranberry juice                    Beef broth                            Chicken broth Jell-O  Grape juice                           Apple juice Coffee or tea                        Jell-O                                      Popsicle                                                Coffee or tea                        Coffee or tea  _____________________________________________________________________     BRUSH YOUR TEETH MORNING OF SURGERY AND RINSE YOUR MOUTH OUT, NO CHEWING GUM CANDY OR MINTS.    Take these medicines the morning of surgery with A SIP OF WATER: metoprolol,omeprazole.                               You may not have any metal on your body including hair pins and              piercings  Do not wear jewelry,lotions, powders or perfumes, deodorant             Men may shave face and neck.   Do not bring valuables to the hospital. [place].  Contacts, dentures or bridgework may not be worn into surgery.  Leave suitcase in the car. After surgery it may be brought to your room.     Patients discharged the day of surgery will not be allowed to drive home. IF YOU ARE HAVING SURGERY AND GOING HOME THE SAME DAY, YOU MUST HAVE AN ADULT TO DRIVE YOU HOME AND BE WITH YOU FOR 24 HOURS. YOU MAY GO HOME BY TAXI OR UBER OR ORTHERWISE, BUT AN ADULT MUST ACCOMPANY YOU HOME AND STAY WITH YOU FOR 24 HOURS.  Name and phone number of your driver:  Special Instructions: N/A              Please read over the following fact sheets you were given: _____________________________________________________________________           [facility] - Preparing for Surgery Before surgery, you can play an important role.  Because skin is not sterile, your skin needs to be as free of germs as possible.  You can reduce the number of germs on your skin by washing with CHG (chlorahexidine gluconate) soap before surgery.  CHG is an antiseptic cleaner which kills germs and bonds with the skin to continue killing germs even after washing. Please DO NOT use if you have an allergy to CHG or antibacterial soaps.  If your skin becomes reddened/irritated stop using the CHG and inform your nurse when you arrive at Short Stay. Do not shave (including legs and underarms) for at least 48 hours prior to the first CHG shower.  You may shave your face/neck. Please follow these  instructions carefully:  1.  Shower with CHG Soap the night before surgery and the  morning of Surgery.  2.  If you choose to wash your hair, wash your hair first as usual with your  normal  shampoo.  3.  After you shampoo, rinse your hair and body thoroughly to remove the  shampoo.                           4.  Use CHG as you would any other liquid soap.  You can apply chg directly  to the skin and wash                       Gently with a scrungie or clean washcloth.  5.  Apply the CHG Soap to your body ONLY FROM THE NECK DOWN.   Do not use on face/ open                           Wound or open sores. Avoid contact with eyes, ears mouth and genitals (private parts).                       Wash face,  Genitals (private parts) with your normal soap.             6.  Wash thoroughly, paying special attention to the area where your surgery  will be performed.  7.  Thoroughly rinse your body with warm water from the neck down.  8.  DO NOT shower/wash with your normal soap after using and rinsing off  the CHG Soap.                9.  Pat yourself dry with a clean towel.            10.  Wear clean pajamas.            11.  Place clean sheets on your bed the night of your first shower and do not  sleep with pets. Day of Surgery : Do not apply any lotions/deodorants the morning of surgery.  Please wear clean clothes to the hospital/surgery center.  FAILURE TO FOLLOW THESE INSTRUCTIONS MAY RESULT IN THE CANCELLATION OF YOUR SURGERY PATIENT SIGNATURE_________________________________  NURSE SIGNATURE__________________________________  ________________________________________________________________________   [facility]- Preparing for Total Shoulder Arthroplasty    Before surgery, you can play an important role. Because skin is not sterile, your skin needs to be as free of germs as possible. You can reduce the number of germs on your skin by using the following products. Benzoyl Peroxide Gel Reduces the  number of germs present on the skin Applied twice a day to shoulder area starting two days before surgery    ==================================================================  Please follow these instructions carefully:  BENZOYL PEROXIDE 5% GEL  Please do not use if you have an allergy to benzoyl peroxide.   If your skin becomes reddened/irritated stop using the benzoyl peroxide.  Starting two days before surgery, apply as follows: Apply benzoyl peroxide in the morning and at night. Apply after taking a shower. If you are not taking a shower clean entire shoulder front, back, and side along with the armpit with a clean wet washcloth.  Place a quarter-sized dollop on your shoulder and rub in thoroughly, making sure to cover the front, back, and side of your shoulder, along with the armpit.   2 days before  ____ AM   ____ PM              1 day before ____ AM   ____ PM                         Do this twice a day for two days.  (Last application is the night before surgery, AFTER using the CHG soap as described below).  Do NOT apply benzoyl peroxide gel on the day of surgery.     Incentive Spirometer  An incentive spirometer is a tool that can help keep your lungs clear and active. This tool measures how well you are filling your lungs with each breath. Taking long deep breaths may help reverse or decrease the chance of developing breathing (pulmonary) problems (especially infection) following: A long period of time when you are unable to move or be active. BEFORE THE PROCEDURE  If the spirometer includes an indicator to show your best effort, your nurse or respiratory therapist will set it to a desired goal. If possible, sit up straight or lean slightly forward. Try not to slouch. Hold the incentive spirometer in an upright position. INSTRUCTIONS FOR USE  Sit on the edge of your bed if possible, or sit up as far as you can in bed or on a chair. Hold the incentive spirometer in an  upright position. Breathe out normally. Place the mouthpiece in your mouth and seal your lips tightly around it. Breathe in slowly and as deeply as possible, raising the piston or the ball toward the top of the column. Hold your breath for 3-5 seconds or for as long as possible. Allow the piston or ball to fall to the bottom of the column. Remove the mouthpiece from your mouth and breathe out normally. Rest for a few seconds and repeat Steps 1 through 7 at least 10 times every 1-2 hours when you are awake. Take your time and take a few normal breaths between deep breaths. The spirometer may include an indicator to show your best effort. Use the indicator as a goal to work toward during each repetition. After each set of 10 deep breaths, practice coughing to be sure your lungs are clear. If you have an incision (the cut made at the time of surgery), support your incision when coughing by placing a pillow or rolled up towels firmly against it. Once you are able to get out of bed, walk around indoors and cough well. You may stop using the incentive spirometer when instructed by your caregiver.  RISKS AND COMPLICATIONS Take your time so you do not get dizzy or light-headed. If you are in pain, you may need to take or ask for pain medication before doing incentive spirometry. It is harder to take a deep breath if you are having pain. AFTER USE Rest and breathe slowly and easily. It can be helpful to keep track of a log of your progress. Your caregiver can provide you with a simple table to help with this. If you are using the spirometer at home, follow these instructions: [place] IF:  You are having difficultly using the spirometer. You have trouble using the spirometer as often as instructed. Your pain medication is not giving enough relief while using the spirometer. You develop fever of 100.5 F (38.1 C) or higher. SEEK IMMEDIATE MEDICAL CARE IF:  You cough up bloody sputum that had  not been present before. You develop fever of 102 F (  38.9 C) or greater. You develop worsening pain at or near the incision site. MAKE SURE YOU:  Understand these instructions. Will watch your condition. Will get help right away if you are not doing well or get worse. Document Released: 07/18/2006 Document Revised: 05/30/2011 Document Reviewed: 09/18/2006 [facility] Patient Information 2014 [place], [place].   ________________________________________________________________________

## 2020-09-30 ENCOUNTER — Ambulatory Visit (HOSPITAL_COMMUNITY)
Admission: RE | Admit: 2020-09-30 | Discharge: 2020-09-30 | Disposition: A | Discharge status: Home / Self Care | Payer: Medicare Other | Source: Ambulatory Visit | Attending: Orthopedic Surgery | Admitting: Orthopedic Surgery

## 2020-09-30 ENCOUNTER — Encounter (HOSPITAL_COMMUNITY)
Admission: RE | Admit: 2020-09-30 | Discharge: 2020-09-30 | Disposition: A | Discharge status: Home / Self Care | Payer: Medicare Other | Source: Ambulatory Visit | Attending: Orthopedic Surgery | Admitting: Orthopedic Surgery

## 2020-09-30 ENCOUNTER — Encounter (HOSPITAL_COMMUNITY): Payer: Self-pay

## 2020-09-30 ENCOUNTER — Other Ambulatory Visit: Payer: Self-pay

## 2020-09-30 DIAGNOSIS — I10 Essential (primary) hypertension: Secondary | ICD-10-CM | POA: Diagnosis not present

## 2020-09-30 DIAGNOSIS — G4733 Obstructive sleep apnea (adult) (pediatric): Secondary | ICD-10-CM | POA: Insufficient documentation

## 2020-09-30 DIAGNOSIS — M19011 Primary osteoarthritis, right shoulder: Secondary | ICD-10-CM | POA: Insufficient documentation

## 2020-09-30 DIAGNOSIS — I2510 Atherosclerotic heart disease of native coronary artery without angina pectoris: Secondary | ICD-10-CM | POA: Diagnosis not present

## 2020-09-30 DIAGNOSIS — Z01818 Encounter for other preprocedural examination: Secondary | ICD-10-CM | POA: Insufficient documentation

## 2020-09-30 DIAGNOSIS — Z01811 Encounter for preprocedural respiratory examination: Secondary | ICD-10-CM | POA: Diagnosis not present

## 2020-09-30 DIAGNOSIS — D684 Acquired coagulation factor deficiency: Secondary | ICD-10-CM | POA: Insufficient documentation

## 2020-09-30 DIAGNOSIS — I82409 Acute embolism and thrombosis of unspecified deep veins of unspecified lower extremity: Secondary | ICD-10-CM | POA: Diagnosis not present

## 2020-09-30 DIAGNOSIS — Z9989 Dependence on other enabling machines and devices: Secondary | ICD-10-CM | POA: Insufficient documentation

## 2020-09-30 DIAGNOSIS — Z7901 Long term (current) use of anticoagulants: Secondary | ICD-10-CM | POA: Insufficient documentation

## 2020-09-30 DIAGNOSIS — Z955 Presence of coronary angioplasty implant and graft: Secondary | ICD-10-CM | POA: Insufficient documentation

## 2020-09-30 DIAGNOSIS — K219 Gastro-esophageal reflux disease without esophagitis: Secondary | ICD-10-CM | POA: Diagnosis not present

## 2020-09-30 HISTORY — DX: Anxiety disorder, unspecified: F41.9

## 2020-09-30 HISTORY — DX: Prediabetes: R73.03

## 2020-09-30 LAB — COMPREHENSIVE METABOLIC PANEL
ALT: 25 U/L (ref 0–44)
AST: 26 U/L (ref 15–41)
Albumin: 3.8 g/dL (ref 3.5–5.0)
Alkaline Phosphatase: 64 U/L (ref 38–126)
Anion gap: 7 (ref 5–15)
BUN: 25 mg/dL — ABNORMAL HIGH (ref 8–23)
CO2: 28 mmol/L (ref 22–32)
Calcium: 9.3 mg/dL (ref 8.9–10.3)
Chloride: 103 mmol/L (ref 98–111)
Creatinine, Ser: 0.88 mg/dL (ref 0.61–1.24)
GFR, Estimated: >60 mL/min (ref >60)
Glucose, Bld: 102 mg/dL — ABNORMAL HIGH (ref 70–99)
Potassium: 4.3 mmol/L (ref 3.5–5.1)
Sodium: 138 mmol/L (ref 135–145)
Total Bilirubin: 0.5 mg/dL (ref 0.3–1.2)
Total Protein: 6.8 g/dL (ref 6.5–8.1)

## 2020-09-30 LAB — URINALYSIS, ROUTINE W REFLEX MICROSCOPIC
Bilirubin Urine: NEGATIVE
Glucose, UA: NEGATIVE mg/dL
Hgb urine dipstick: NEGATIVE
Ketones, ur: NEGATIVE mg/dL
Leukocytes,Ua: NEGATIVE
Nitrite: NEGATIVE
Protein, ur: NEGATIVE mg/dL
Specific Gravity, Urine: 1.018 (ref 1.005–1.030)
pH: 5 (ref 5.0–8.0)

## 2020-09-30 LAB — CBC WITH DIFFERENTIAL/PLATELET
Abs Immature Granulocytes: 0.01 10*3/uL (ref 0.00–0.07)
Basophils Absolute: 0 10*3/uL (ref 0.0–0.1)
Basophils Relative: 0 %
Eosinophils Absolute: 0.1 10*3/uL (ref 0.0–0.5)
Eosinophils Relative: 1 %
HCT: 51.1 % (ref 39.0–52.0)
Hemoglobin: 16.7 g/dL (ref 13.0–17.0)
Immature Granulocytes: 0 %
Lymphocytes Relative: 18 %
Lymphs Abs: 0.9 10*3/uL (ref 0.7–4.0)
MCH: 32.1 pg (ref 26.0–34.0)
MCHC: 32.7 g/dL (ref 30.0–36.0)
MCV: 98.1 fL (ref 80.0–100.0)
Monocytes Absolute: 0.6 10*3/uL (ref 0.1–1.0)
Monocytes Relative: 11 %
Neutro Abs: 3.6 10*3/uL (ref 1.7–7.7)
Neutrophils Relative %: 70 %
Platelets: 238 10*3/uL (ref 150–400)
RBC: 5.21 MIL/uL (ref 4.22–5.81)
RDW: 13.3 % (ref 11.5–15.5)
WBC: 5.1 10*3/uL (ref 4.0–10.5)
nRBC: 0 % (ref 0.0–0.2)

## 2020-09-30 LAB — SURGICAL PCR SCREEN
MRSA, PCR: NEGATIVE
Staphylococcus aureus: NEGATIVE

## 2020-09-30 LAB — HEMOGLOBIN A1C
Hgb A1c MFr Bld: 6 % — ABNORMAL HIGH (ref 4.8–5.6)
Mean Plasma Glucose: 125.5 mg/dL

## 2020-09-30 LAB — APTT: aPTT: 33 seconds (ref 24–36)

## 2020-09-30 NOTE — Progress Notes (Signed)
COVID Vaccine Completed: Yes Date COVID Vaccine completed: 12/2019 [place] COVID vaccine manufacturer:   Moderna     PCP - Dr. [name] Cardiologist - Dr. Quay Burow. LOV: 01/28/20. Clearance: Benjaman Lobe [place]: PA: 09/16/20. EPIC  Chest x-ray -  EKG -  Stress Test -  ECHO - 03/23/18 Cardiac Cath -  Pacemaker/ICD device last checked:  Sleep Study - Yes CPAP - No  Fasting Blood Sugar -  Checks Blood Sugar _____ times a day  Blood Thinner Instructions: No instructions for Xarelto yet.RN advised pt. To call Dr. Thompson Caul office or Dr. Tamera Punt and ask for the instructions. 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 Patient was also instructed that they will need to review over the PAT instructions again at home before surgery.
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
## 2020-10-02 ENCOUNTER — Telehealth: Payer: Self-pay | Admitting: Cardiovascular Disease

## 2020-10-02 NOTE — Telephone Encounter (Signed)
        Patient Name: Nathan Rodriguez  DOB: Jun 25, 1945 MRN: [id number]  Primary Cardiologist: Quay Burow, MD  Chart reviewed as part of pre-operative protocol coverage. This appears to be a duplicate request, though the requesting surgeon has changed.   Per recent preop assessment by Doreene Adas, PA-C 09/16/20:  Patient was contacted 09/16/2020 in reference to pre-operative risk assessment for pending surgery as outlined below.  Nathan Rodriguez was last seen on  01/28/20 by Dr. Gwenlyn Found.  Since that day, Nathan Rodriguez has done well. He can complete more than 4.0 METS (biking, walking).   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 Please reach out to them for [facility] hold.   Therefore, based on ACC/AHA guidelines, the patient would be at acceptable risk for the planned procedure without further cardiovascular testing.   The patient was advised that if he develops new symptoms prior to surgery to contact our office to arrange for a follow-up visit, and he verbalized understanding.    Tami Lin Duke, PA 09/16/2020, 1:39 PM   At this time I will route this recommendation to the requesting party via [place] fax function and remove from pre-op pool. Please call with questions.  Abigail Butts, PA-C 10/02/2020, 5:13 PM

## 2020-10-02 NOTE — Telephone Encounter (Signed)
   [facility] Pre-operative Risk Assessment    Patient Name: Nathan Rodriguez  DOB: 01-10-1946 MRN: [id number]  HEARTCARE STAFF:  - IMPORTANT!!!!!! Under Visit Info/Reason for Call, type in Other and utilize the format Clearance MM/DD/YY or Clearance TBD. Do not use dashes or single digits. - Please review there is not already an duplicate clearance open for this procedure. - If request is for dental extraction, please clarify the # of teeth to be extracted. - If the patient is currently at the dentist's office, call Pre-Op Callback Staff (MA/nurse) to input urgent request.  - If the patient is not currently in the dentist office, please route to the Pre-Op pool.  Request for surgical clearance:  What type of surgery is being performed? Right Shoulder   When is this surgery scheduled? 10/08/20  What type of clearance is required (medical clearance vs. Pharmacy clearance to hold med vs. Both)?   Are there any medications that need to be held prior to surgery and how long? No  Practice name and name of physician performing surgery? [facility] / Dr. Tamera Punt  What is the office phone number? [phone]  [phone]   7.   What is the office fax number? [phone]  8.   Anesthesia type (None, local, MAC, general) ? N/a   Kamira J Martinique 10/02/2020, 4:21 PM  _________________________________________________________________   (provider comments below)

## 2020-10-02 NOTE — Anesthesia Preprocedure Evaluation (Addendum)
Anesthesia Evaluation  Patient identified by MRN, date of birth, ID band Patient awake    Reviewed: Allergy & Precautions, NPO status , Patient's Chart, lab work & pertinent test results, reviewed documented beta blocker date and time   History of Anesthesia Complications (+) PROLONGED EMERGENCE and history of anesthetic complications  Airway Mallampati: II  TM Distance: >3 FB Neck ROM: Full    Dental  (+) Dental Advisory Given, Partial Lower, Missing   Pulmonary sleep apnea , former smoker, PE   Pulmonary exam normal breath sounds clear to auscultation       Cardiovascular hypertension, Pt. on home beta blockers (-) angina+ CAD, + Cardiac Stents (DES x2 LAD) and + DVT  Normal cardiovascular exam Rhythm:Regular Rate:Normal     Neuro/Psych  Headaches, PSYCHIATRIC DISORDERS Anxiety Depression    GI/Hepatic Neg liver ROS, GERD  Medicated,  Endo/Other  negative endocrine ROS  Renal/GU negative Renal ROS     Musculoskeletal  (+) Arthritis ,   Abdominal   Peds  Hematology  (+) Blood dyscrasia (Xarelto), , factor V deficiency   Anesthesia Other Findings Day of surgery medications reviewed with the patient.  Reproductive/Obstetrics                           Anesthesia Physical Anesthesia Plan  ASA: 3  Anesthesia Plan: General   Post-op Pain Management:  Regional for Post-op pain   Induction: Intravenous  PONV Risk Score and Plan: 2 and Dexamethasone and Ondansetron  Airway Management Planned: Oral ETT  Additional Equipment:   Intra-op Plan:   Post-operative Plan: Extubation in OR  Informed Consent: I have reviewed the patients History and Physical, chart, labs and discussed the procedure including the risks, benefits and alternatives for the proposed anesthesia with the patient or authorized representative who has indicated his/her understanding and acceptance.     Dental advisory  given  Plan Discussed with: CRNA  Anesthesia Plan Comments: (See PAT note 09/30/2020, Konrad Felix, PA-C)       Anesthesia Quick Evaluation

## 2020-10-08 ENCOUNTER — Ambulatory Visit (HOSPITAL_COMMUNITY): Payer: Medicare Other | Admitting: Anesthesiology

## 2020-10-08 ENCOUNTER — Other Ambulatory Visit: Payer: Self-pay

## 2020-10-08 ENCOUNTER — Encounter (HOSPITAL_COMMUNITY)
Admission: RE | Disposition: A | Discharge status: Home / Self Care | Payer: Self-pay | Source: Home / Self Care | Attending: Orthopedic Surgery

## 2020-10-08 ENCOUNTER — Ambulatory Visit (HOSPITAL_COMMUNITY): Payer: Medicare Other

## 2020-10-08 ENCOUNTER — Ambulatory Visit (HOSPITAL_COMMUNITY): Payer: Medicare Other | Admitting: Physician Assistant

## 2020-10-08 ENCOUNTER — Encounter (HOSPITAL_COMMUNITY): Payer: Self-pay | Admitting: Orthopedic Surgery

## 2020-10-08 ENCOUNTER — Observation Stay (HOSPITAL_COMMUNITY)
Admission: RE | Admit: 2020-10-08 | Discharge: 2020-10-09 | Disposition: A | Discharge status: Home / Self Care | Payer: Medicare Other | Attending: Orthopedic Surgery | Admitting: Orthopedic Surgery

## 2020-10-08 DIAGNOSIS — M19011 Primary osteoarthritis, right shoulder: Secondary | ICD-10-CM | POA: Diagnosis present

## 2020-10-08 DIAGNOSIS — I2510 Atherosclerotic heart disease of native coronary artery without angina pectoris: Secondary | ICD-10-CM | POA: Diagnosis not present

## 2020-10-08 DIAGNOSIS — R7303 Prediabetes: Secondary | ICD-10-CM | POA: Diagnosis not present

## 2020-10-08 DIAGNOSIS — Z79899 Other long term (current) drug therapy: Secondary | ICD-10-CM | POA: Insufficient documentation

## 2020-10-08 DIAGNOSIS — I119 Hypertensive heart disease without heart failure: Secondary | ICD-10-CM | POA: Insufficient documentation

## 2020-10-08 DIAGNOSIS — Z87891 Personal history of nicotine dependence: Secondary | ICD-10-CM | POA: Insufficient documentation

## 2020-10-08 DIAGNOSIS — Z96611 Presence of right artificial shoulder joint: Secondary | ICD-10-CM

## 2020-10-08 HISTORY — PX: TOTAL SHOULDER ARTHROPLASTY: SHX126

## 2020-10-08 LAB — ABO/RH: ABO/RH(D): O POS

## 2020-10-08 LAB — TYPE AND SCREEN
ABO/RH(D): O POS
Antibody Screen: NEGATIVE

## 2020-10-08 SURGERY — ARTHROPLASTY, SHOULDER, TOTAL
Anesthesia: General | Site: Shoulder | Laterality: Right

## 2020-10-08 MED ORDER — ONDANSETRON HCL 4 MG/2ML IJ SOLN
INTRAMUSCULAR | Status: AC
  Filled 2020-10-08: qty 2

## 2020-10-08 MED ORDER — OXYCODONE HCL 5 MG PO TABS: 5.0000 mg | ORAL_TABLET | ORAL | Status: DC | PRN

## 2020-10-08 MED ORDER — ROCURONIUM BROMIDE 10 MG/ML (PF) SYRINGE
PREFILLED_SYRINGE | INTRAVENOUS | Status: AC
  Filled 2020-10-08: qty 10

## 2020-10-08 MED ORDER — METOPROLOL TARTRATE 25 MG PO TABS
25.0000 mg | ORAL_TABLET | Freq: Every day | ORAL | Status: DC
  Administered 2020-10-08: 25 mg via ORAL
  Filled 2020-10-08 (×2): qty 1

## 2020-10-08 MED ORDER — PROPOFOL 10 MG/ML IV BOLUS
INTRAVENOUS | Status: AC
  Filled 2020-10-08: qty 20

## 2020-10-08 MED ORDER — FENTANYL CITRATE (PF) 100 MCG/2ML IJ SOLN: 25.0000 ug | INTRAMUSCULAR | Status: DC | PRN

## 2020-10-08 MED ORDER — ACETAMINOPHEN 500 MG PO TABS
1000.0000 mg | ORAL_TABLET | Freq: Once | ORAL | Status: DC
  Filled 2020-10-08: qty 2

## 2020-10-08 MED ORDER — ONDANSETRON HCL 4 MG/2ML IJ SOLN: 4.0000 mg | Freq: Four times a day (QID) | INTRAMUSCULAR | Status: DC | PRN

## 2020-10-08 MED ORDER — PHENOL 1.4 % MT LIQD: 1.0000 | OROMUCOSAL | Status: DC | PRN

## 2020-10-08 MED ORDER — BUPIVACAINE HCL (PF) 0.5 % IJ SOLN
INTRAMUSCULAR | Status: DC | PRN
  Administered 2020-10-08: 10 mL via PERINEURAL

## 2020-10-08 MED ORDER — SODIUM CHLORIDE 0.9 % IV SOLN
2.0000 g | INTRAVENOUS | Status: AC
  Administered 2020-10-08: 2 g via INTRAVENOUS
  Filled 2020-10-08: qty 2

## 2020-10-08 MED ORDER — PHENYLEPHRINE HCL (PRESSORS) 10 MG/ML IV SOLN
INTRAVENOUS | Status: AC
  Filled 2020-10-08: qty 1

## 2020-10-08 MED ORDER — SODIUM CHLORIDE 0.9 % IV SOLN: INTRAVENOUS | Status: DC

## 2020-10-08 MED ORDER — BUPIVACAINE LIPOSOME 1.3 % IJ SUSP
INTRAMUSCULAR | Status: DC | PRN
  Administered 2020-10-08: 10 mL via PERINEURAL

## 2020-10-08 MED ORDER — ACETAMINOPHEN 500 MG PO TABS
1000.0000 mg | ORAL_TABLET | Freq: Four times a day (QID) | ORAL | Status: DC
  Administered 2020-10-08 – 2020-10-09 (×3): 1000 mg via ORAL
  Filled 2020-10-08 (×3): qty 2

## 2020-10-08 MED ORDER — ADULT MULTIVITAMIN W/MINERALS CH: 1.0000 | ORAL_TABLET | Freq: Every day | ORAL | Status: DC

## 2020-10-08 MED ORDER — ALUM & MAG HYDROXIDE-SIMETH 200-200-20 MG/5ML PO SUSP: 30.0000 mL | ORAL | Status: DC | PRN

## 2020-10-08 MED ORDER — ALUM HYDROXIDE-MAG CARBONATE 95-358 MG/15ML PO SUSP
15.0000 mL | ORAL | Status: DC | PRN
  Filled 2020-10-08: qty 15

## 2020-10-08 MED ORDER — LIDOCAINE 2% (20 MG/ML) 5 ML SYRINGE
INTRAMUSCULAR | Status: AC
  Filled 2020-10-08: qty 5

## 2020-10-08 MED ORDER — ONDANSETRON HCL 4 MG/2ML IJ SOLN: 4.0000 mg | Freq: Once | INTRAMUSCULAR | Status: DC | PRN

## 2020-10-08 MED ORDER — TRANEXAMIC ACID-NACL 1000-0.7 MG/100ML-% IV SOLN
1000.0000 mg | INTRAVENOUS | Status: AC
  Administered 2020-10-08: 1000 mg via INTRAVENOUS
  Filled 2020-10-08: qty 100

## 2020-10-08 MED ORDER — DEXAMETHASONE SODIUM PHOSPHATE 10 MG/ML IJ SOLN
INTRAMUSCULAR | Status: AC
  Filled 2020-10-08: qty 1

## 2020-10-08 MED ORDER — DEXAMETHASONE SODIUM PHOSPHATE 10 MG/ML IJ SOLN
INTRAMUSCULAR | Status: DC | PRN
  Administered 2020-10-08: 10 mg via INTRAVENOUS

## 2020-10-08 MED ORDER — MIDAZOLAM HCL 5 MG/5ML IJ SOLN
INTRAMUSCULAR | Status: DC | PRN
  Administered 2020-10-08: 1 mg via INTRAVENOUS

## 2020-10-08 MED ORDER — DOCUSATE SODIUM 100 MG PO CAPS
100.0000 mg | ORAL_CAPSULE | Freq: Two times a day (BID) | ORAL | Status: DC
  Administered 2020-10-08: 100 mg via ORAL
  Filled 2020-10-08: qty 1

## 2020-10-08 MED ORDER — PANTOPRAZOLE SODIUM 40 MG PO TBEC
40.0000 mg | DELAYED_RELEASE_TABLET | Freq: Every day | ORAL | Status: DC
  Administered 2020-10-08: 40 mg via ORAL
  Filled 2020-10-08: qty 1

## 2020-10-08 MED ORDER — OXYCODONE HCL 5 MG PO TABS: 10.0000 mg | ORAL_TABLET | ORAL | Status: DC | PRN

## 2020-10-08 MED ORDER — ACETAMINOPHEN 325 MG PO TABS: 325.0000 mg | ORAL_TABLET | Freq: Four times a day (QID) | ORAL | Status: DC | PRN

## 2020-10-08 MED ORDER — CHLORHEXIDINE GLUCONATE 0.12 % MT SOLN
15.0000 mL | Freq: Once | OROMUCOSAL | Status: AC
  Administered 2020-10-08: 15 mL via OROMUCOSAL

## 2020-10-08 MED ORDER — ONDANSETRON HCL 4 MG PO TABS: 4.0000 mg | ORAL_TABLET | Freq: Four times a day (QID) | ORAL | Status: DC | PRN

## 2020-10-08 MED ORDER — METOCLOPRAMIDE HCL 5 MG/ML IJ SOLN: 5.0000 mg | Freq: Three times a day (TID) | INTRAMUSCULAR | Status: DC | PRN

## 2020-10-08 MED ORDER — SODIUM CHLORIDE 0.9 % IR SOLN
Status: DC | PRN
  Administered 2020-10-08 (×2): 1000 mL

## 2020-10-08 MED ORDER — BISACODYL 5 MG PO TBEC: 5.0000 mg | DELAYED_RELEASE_TABLET | Freq: Every day | ORAL | Status: DC | PRN

## 2020-10-08 MED ORDER — POLYETHYLENE GLYCOL 3350 17 G PO PACK: 17.0000 g | PACK | Freq: Every day | ORAL | Status: DC | PRN

## 2020-10-08 MED ORDER — MAGNESIUM OXIDE -MG SUPPLEMENT 400 (240 MG) MG PO TABS
400.0000 mg | ORAL_TABLET | Freq: Every day | ORAL | Status: DC
  Administered 2020-10-08: 400 mg via ORAL
  Filled 2020-10-08: qty 1

## 2020-10-08 MED ORDER — ONDANSETRON HCL 4 MG/2ML IJ SOLN
INTRAMUSCULAR | Status: DC | PRN
  Administered 2020-10-08: 4 mg via INTRAVENOUS

## 2020-10-08 MED ORDER — RIVAROXABAN 10 MG PO TABS
20.0000 mg | ORAL_TABLET | Freq: Every day | ORAL | Status: DC
  Filled 2020-10-08: qty 2

## 2020-10-08 MED ORDER — PHENYLEPHRINE HCL-NACL 10-0.9 MG/250ML-% IV SOLN
INTRAVENOUS | Status: DC | PRN
  Administered 2020-10-08: 40 ug/min via INTRAVENOUS

## 2020-10-08 MED ORDER — MIDAZOLAM HCL 2 MG/2ML IJ SOLN
1.0000 mg | INTRAMUSCULAR | Status: DC
  Filled 2020-10-08: qty 2

## 2020-10-08 MED ORDER — MENTHOL 3 MG MT LOZG: 1.0000 | LOZENGE | OROMUCOSAL | Status: DC | PRN

## 2020-10-08 MED ORDER — OMEGA-3-ACID ETHYL ESTERS 1 G PO CAPS: 1000.0000 mg | ORAL_CAPSULE | Freq: Every day | ORAL | Status: DC

## 2020-10-08 MED ORDER — HYDROCORTISONE VALERATE 0.2 % EX CREA
1.0000 "application " | TOPICAL_CREAM | Freq: Two times a day (BID) | CUTANEOUS | Status: DC | PRN
  Filled 2020-10-08: qty 15

## 2020-10-08 MED ORDER — SUGAMMADEX SODIUM 200 MG/2ML IV SOLN
INTRAVENOUS | Status: DC | PRN
  Administered 2020-10-08: 200 mg via INTRAVENOUS

## 2020-10-08 MED ORDER — PROPOFOL 10 MG/ML IV BOLUS
INTRAVENOUS | Status: DC | PRN
  Administered 2020-10-08: 120 mg via INTRAVENOUS

## 2020-10-08 MED ORDER — LACTATED RINGERS IV SOLN: INTRAVENOUS | Status: DC

## 2020-10-08 MED ORDER — ORAL CARE MOUTH RINSE: 15.0000 mL | Freq: Once | OROMUCOSAL | Status: AC

## 2020-10-08 MED ORDER — METOCLOPRAMIDE HCL 5 MG PO TABS: 5.0000 mg | ORAL_TABLET | Freq: Three times a day (TID) | ORAL | Status: DC | PRN

## 2020-10-08 MED ORDER — ZOLPIDEM TARTRATE 5 MG PO TABS: 5.0000 mg | ORAL_TABLET | Freq: Every evening | ORAL | Status: DC | PRN

## 2020-10-08 MED ORDER — FLEET ENEMA 7-19 GM/118ML RE ENEM: 1.0000 | ENEMA | Freq: Once | RECTAL | Status: DC | PRN

## 2020-10-08 MED ORDER — ROCURONIUM BROMIDE 100 MG/10ML IV SOLN
INTRAVENOUS | Status: DC | PRN
  Administered 2020-10-08: 50 mg via INTRAVENOUS

## 2020-10-08 MED ORDER — HYDROMORPHONE HCL 1 MG/ML IJ SOLN: 0.5000 mg | INTRAMUSCULAR | Status: DC | PRN

## 2020-10-08 MED ORDER — LIDOCAINE HCL (CARDIAC) PF 100 MG/5ML IV SOSY
PREFILLED_SYRINGE | INTRAVENOUS | Status: DC | PRN
  Administered 2020-10-08: 80 mg via INTRAVENOUS

## 2020-10-08 MED ORDER — DIPHENHYDRAMINE HCL 12.5 MG/5ML PO ELIX: 12.5000 mg | ORAL_SOLUTION | ORAL | Status: DC | PRN

## 2020-10-08 MED ORDER — MELATONIN 5 MG PO TABS
10.0000 mg | ORAL_TABLET | Freq: Every day | ORAL | Status: DC
  Administered 2020-10-08: 10 mg via ORAL
  Filled 2020-10-08: qty 2

## 2020-10-08 MED ORDER — FENTANYL CITRATE (PF) 100 MCG/2ML IJ SOLN
50.0000 ug | INTRAMUSCULAR | Status: DC
  Administered 2020-10-08: 50 ug via INTRAVENOUS
  Filled 2020-10-08: qty 2

## 2020-10-08 MED ORDER — MIDAZOLAM HCL 2 MG/2ML IJ SOLN
INTRAMUSCULAR | Status: AC
  Filled 2020-10-08: qty 2

## 2020-10-08 SURGICAL SUPPLY — 66 items
BAG COUNTER SPONGE SURGICOUNT (BAG) ×2 IMPLANT
BAG SURGICOUNT SPONGE COUNTING (BAG) ×1
BAG ZIPLOCK 12X15 (MISCELLANEOUS) ×3 IMPLANT
BIT DRILL 1.6MX128 (BIT) ×2 IMPLANT
BIT DRILL 1.6MX128MM (BIT) ×1
BLADE SAW SAG 73X25 THK (BLADE) ×2
BLADE SAW SGTL 73X25 THK (BLADE) ×1 IMPLANT
CEMENT BONE DEPUY (Cement) ×3 IMPLANT
CLOSURE WOUND 1/2 X4 (GAUZE/BANDAGES/DRESSINGS) ×1
COOLER ICEMAN CLASSIC (MISCELLANEOUS) IMPLANT
COVER BACK TABLE 60X90IN (DRAPES) ×3 IMPLANT
COVER SURGICAL LIGHT HANDLE (MISCELLANEOUS) ×3 IMPLANT
DRAPE INCISE IOBAN 66X45 STRL (DRAPES) ×3 IMPLANT
DRAPE ORTHO SPLIT 77X108 STRL (DRAPES) ×6
DRAPE POUCH INSTRU U-SHP 10X18 (DRAPES) ×3 IMPLANT
DRAPE SURG 17X11 SM STRL (DRAPES) ×3 IMPLANT
DRAPE SURG ORHT 6 SPLT 77X108 (DRAPES) ×2 IMPLANT
DRAPE TOP 10253 STERILE (DRAPES) ×6 IMPLANT
DRAPE U-SHAPE 47X51 STRL (DRAPES) ×3 IMPLANT
DRSG AQUACEL AG ADV 3.5X 6 (GAUZE/BANDAGES/DRESSINGS) ×3 IMPLANT
DURAPREP 26ML APPLICATOR (WOUND CARE) ×6 IMPLANT
ELECT BLADE TIP CTD 4 INCH (ELECTRODE) ×3 IMPLANT
ELECT REM PT RETURN 15FT ADLT (MISCELLANEOUS) ×3 IMPLANT
GLENOID PEGGED CORTILOC M40 (Orthopedic Implant) ×1 IMPLANT
GLOVE SRG 8 PF TXTR STRL LF DI (GLOVE) ×1 IMPLANT
GLOVE SURG ENC MOIS LTX SZ6.5 (GLOVE) ×3 IMPLANT
GLOVE SURG ENC MOIS LTX SZ7.5 (GLOVE) ×3 IMPLANT
GLOVE SURG UNDER POLY LF SZ6.5 (GLOVE) ×3 IMPLANT
GLOVE SURG UNDER POLY LF SZ8 (GLOVE) ×3
GOWN STRL REUS W/TWL LRG LVL3 (GOWN DISPOSABLE) ×3 IMPLANT
GOWN STRL REUS W/TWL XL LVL3 (GOWN DISPOSABLE) ×3 IMPLANT
GUIDEWIRE GLENOID 2.5X220 (WIRE) ×3 IMPLANT
HANDPIECE INTERPULSE COAX TIP (DISPOSABLE) ×3
HEAD HUM AEQ HO 50X19 (Head) ×3 IMPLANT
HEMOSTAT SURGICEL 2X14 (HEMOSTASIS) ×3 IMPLANT
HOOD PEEL AWAY FLYTE STAYCOOL (MISCELLANEOUS) ×9 IMPLANT
KIT BASIN OR (CUSTOM PROCEDURE TRAY) ×3 IMPLANT
KIT TURNOVER KIT A (KITS) ×3 IMPLANT
MANIFOLD NEPTUNE II (INSTRUMENTS) ×3 IMPLANT
NEEDLE TROCAR POINT SZ 2 1/2 (NEEDLE) ×3 IMPLANT
NS IRRIG 1000ML POUR BTL (IV SOLUTION) ×3 IMPLANT
PACK SHOULDER (CUSTOM PROCEDURE TRAY) ×3 IMPLANT
PAD COLD SHLDR WRAP-ON (PAD) ×3 IMPLANT
PEGGED GLENOID CORTILOC (Orthopedic Implant) ×2 IMPLANT
PROTECTOR NERVE ULNAR (MISCELLANEOUS) IMPLANT
RESTRAINT HEAD UNIVERSAL NS (MISCELLANEOUS) ×3 IMPLANT
RETRIEVER SUT HEWSON (MISCELLANEOUS) ×3 IMPLANT
SET HNDPC FAN SPRY TIP SCT (DISPOSABLE) ×1 IMPLANT
SLING ARM IMMOBILIZER LRG (SOFTGOODS) ×3 IMPLANT
SMARTMIX MINI TOWER (MISCELLANEOUS) ×3
SPONGE T-LAP 18X18 ~~LOC~~+RFID (SPONGE) ×3 IMPLANT
SPONGE T-LAP 4X18 ~~LOC~~+RFID (SPONGE) ×3 IMPLANT
STEM HUMERAL PTC SZ1C 66 1275D (Stem) ×3 IMPLANT
STRIP CLOSURE SKIN 1/2X4 (GAUZE/BANDAGES/DRESSINGS) ×2 IMPLANT
SUCTION FRAZIER HANDLE 12FR (TUBING) ×3
SUCTION TUBE FRAZIER 12FR DISP (TUBING) ×1 IMPLANT
SUPPORT WRAP ARM LG (MISCELLANEOUS) ×3 IMPLANT
SUT ETHIBOND 2 V 37 (SUTURE) ×6 IMPLANT
SUT MNCRL AB 4-0 PS2 18 (SUTURE) ×3 IMPLANT
SUT VIC AB 2-0 CT1 27 (SUTURE) ×6
SUT VIC AB 2-0 CT1 TAPERPNT 27 (SUTURE) ×2 IMPLANT
TAPE LABRALWHITE 1.5X36 (TAPE) ×3 IMPLANT
TAPE SUT LABRALTAP WHT/BLK (SUTURE) ×3 IMPLANT
TOWEL OR 17X26 10 PK STRL BLUE (TOWEL DISPOSABLE) ×3 IMPLANT
TOWER SMARTMIX MINI (MISCELLANEOUS) ×1 IMPLANT
WATER STERILE IRR 1000ML POUR (IV SOLUTION) ×6 IMPLANT

## 2020-10-08 NOTE — Op Note (Signed)
Procedure(s): TOTAL SHOULDER ARTHROPLASTY Procedure Note  Nathan Rodriguez male 75 y.o. 10/08/2020   Preoperative diagnosis: Right shoulder end-stage osteoarthritis  Postoperative diagnosis: Same  Procedure(s) and Anesthesia Type:    * TOTAL SHOULDER ARTHROPLASTY - Choice  Surgeon(s) and Role:    Tania Ade, MD - Primary   Indications:  75 y.o. male  With endstage right shoulder arthritis. Pain and dysfunction interfered with quality of life and nonoperative treatment with activity modification, NSAIDS and injections failed.     Surgeon: Isabella Stalling   Assistants: Sheryle Hail PA-C ([address] was present and scrubbed throughout the procedure and was essential in positioning, retraction, exposure, and closure)  Anesthesia: General endotracheal anesthesia with preoperative interscalene block given by the attending anesthesiologist     Procedure Detail  TOTAL SHOULDER ARTHROPLASTY  Findings: Tornier flex anatomic press-fit size 1 stem with a 50 x 19 head, cemented size medium 40 Cortiloc glenoid.   A lesser tuberosity osteotomy was performed and repaired at the conclusion of the procedure.  Estimated Blood Loss:  200 mL         Drains: None   Blood Given: none          Specimens: none        Complications:  * No complications entered in OR log *         Disposition: PACU - hemodynamically stable.         Condition: stable    Procedure:   The patient was identified in the preoperative holding area where I personally marked the operative extremity after verifying with the patient and consent. He  was taken to the operating room where He was transferred to the   operative table.  The patient received an interscalene block in   the holding area by the attending anesthesiologist.  General anesthesia was induced   in the operating room without complication.  The patient did receive IV  Ancef prior to the commencement of the procedure.  The patient was    placed in the beach-chair position with the back raised about 30   degrees.  The nonoperative extremity and head and neck were carefully   positioned and padded protecting against neurovascular compromise.  The   left upper extremity was then prepped and draped in the standard sterile   fashion.    The appropriate operative time-out was performed with   Anesthesia, the perioperative staff, as well as myself and we all agreed   that the right side was the correct operative site.  An approximately   10 cm incision was made from the tip of the coracoid to the center point of the   humerus at the level of the axilla.  Dissection was carried down sharply   through subcutaneous tissues and cephalic vein was identified and taken   laterally with the deltoid.  The pectoralis major was taken medially.  The   upper 1 cm of the pectoralis major was released from its attachment on   the humerus.  The clavipectoral fascia was incised just lateral to the   conjoined tendon.  This incision was carried up to but not into the   coracoacromial ligament.  Digital palpation was used to prove   integrity of the axillary nerve which was protected throughout the   procedure.  Musculocutaneous nerve was not palpated in the operative   field.  Conjoined tendon was then retracted gently medially and the   deltoid laterally.  Anterior circumflex humeral vessels  were clamped and   coagulated.  The soft tissues overlying the biceps was incised and this   incision was carried across the transverse humeral ligament to the base   of the coracoid.  The biceps was noted to be severely degenerated. It was released from the superior labrum. The biceps was then tenodesed to the soft tissue just above   pectoralis major and the remaining portion of the biceps superiorly was   excised.  An osteotomy was performed at the lesser tuberosity.  The capsule was then   released all the way down to the 6 o'clock position of the humeral  head.   The humeral head was then delivered with simultaneous adduction,   extension and external rotation.  All humeral osteophytes were removed   and the anatomic neck of the humerus was marked and cut free hand at   approximately 25 degrees retroversion within about 3 mm of the cuff   reflection posteriorly.  The head size was estimated to be a 50 medium   offset.  At that point, the humeral head was retracted posteriorly with   a Fukuda retractor.   Remaining portion of the capsule was released at the base of the   coracoid.  The remaining biceps anchor and the entire anterior-inferior   labrum was excised.  The posterior labrum was also excised but the   posterior capsule was not released.  The guidepin was placed bicortically with 10 degree elevated guide.  The reamer was used to ream to concentric bone with punctate bleeding.  This gave an excellent concentric surface.  The center hole was then drilled for an anchor peg glenoid followed by the three peripheral holes and none of the holes   exited the glenoid wall.  I then pulse irrigated these holes and dried   them with Surgicel.  The three peripheral holes were then   pressurized cemented and the anchor peg glenoid was placed and impacted   with an excellent fit.  The glenoid was a 40 medium component.  The proximal humerus was then again exposed taking care not to displace the glenoid.    The entry awl was used followed by sounding reamers and then broached for a size 1. This was then left in place and the calcar planer was used. Trial head was placed with a 50 x 19.  With the trial implantation of the component,  there was approximately 50% posterior translation with immediate snap back to the   anatomic position.  With forward elevation, there was no tendency   towards posterior subluxation.   The trial was removed and the final implant was prepared on a back table.  The trial was removed and the final implant was prepared on a back  table.   3 small holes were drilled on the medial side of the lesser tuberosity osteotomy, through which 2 labral tapes were passed. The implant was then placed through the loop of the 2 labral tapes and impacted with an excellent press-fit. This achieved excellent anatomic reconstruction of the proximal humerus.  The joint was then copiously irrigated with pulse lavage.  The subscapularis and   lesser tuberosity osteotomy were then repaired using the 2 labral tapes previously passed in a double row fashion with horizontal mattress sutures medially brought over through bone tunnels tied over a bone bridge laterally.   One #1 Ethibond was placed at the rotator interval just above   the lesser tuberosity. Copious irrigation was used. Skin was  closed with 2-0 Vicryl sutures in the deep dermal layer and 4-0 Monocryl in a subcuticular  running fashion.  Sterile dressings were then applied including Aquacel.  The patient was placed in a sling and allowed to awaken from general anesthesia and taken to the recovery room in stable condition.      POSTOPERATIVE PLAN:  Early passive range of motion will be allowed with the goal of 0 degrees external rotation and 90 degrees forward elevation.  No internal rotation at this time.  No active motion of the arm until the lesser tuberosity heals.  The patient will likely be kept in the hospital for observation and then discharged home in the morning.

## 2020-10-08 NOTE — Anesthesia Postprocedure Evaluation (Signed)
Anesthesia Post Note  Patient: Nathan Rodriguez  Procedure(s) Performed: TOTAL SHOULDER ARTHROPLASTY (Right: Shoulder)     Patient location during evaluation: PACU Anesthesia Type: General Level of consciousness: awake and alert Pain management: pain level controlled Vital Signs Assessment: post-procedure vital signs reviewed and stable Respiratory status: spontaneous breathing, nonlabored ventilation, respiratory function stable and patient connected to nasal cannula oxygen Cardiovascular status: blood pressure returned to baseline and stable Postop Assessment: no apparent nausea or vomiting Anesthetic complications: no   No notable events documented.  Last Vitals:  Vitals:   10/08/20 1400 10/08/20 1548  BP: (!) 132/99 (!) 132/94  Pulse: 69 81  Resp: 16 16  Temp: 36.6 C 36.4 C  SpO2: 95% 96%    Last Pain:  Vitals:   10/08/20 1548  TempSrc: Oral  PainSc:                  Catalina Gravel

## 2020-10-08 NOTE — Transfer of Care (Signed)
Immediate Anesthesia Transfer of Care Note  Patient: Nathan Rodriguez  Procedure(s) Performed: TOTAL SHOULDER ARTHROPLASTY (Right: Shoulder)  Patient Location: PACU  Anesthesia Type:General and Regional  Level of Consciousness: drowsy and patient cooperative  Airway & Oxygen Therapy: Patient Spontanous Breathing and Patient connected to face mask oxygen  Post-op Assessment: Report given to RN and Post -op Vital signs reviewed and stable  Post vital signs: Reviewed and stable  Last Vitals:  Vitals Value Taken Time  BP 121/92 10/08/20 1133  Temp    Pulse 69 10/08/20 1137  Resp 14 10/08/20 1137  SpO2 98 % 10/08/20 1137  Vitals shown include unvalidated device data.  Last Pain:  Vitals:   10/08/20 0809  TempSrc:   PainSc: 4       Patients Stated Pain Goal: 4 ([id number])  Complications: No notable events documented.

## 2020-10-08 NOTE — Plan of Care (Signed)
  Problem: Education: Goal: Knowledge of General Education information will improve Description: Including pain rating scale, medication(s)/side effects and non-pharmacologic comfort measures Outcome: Progressing   Problem: Activity: Goal: Risk for activity intolerance will decrease Outcome: Progressing   Problem: Pain Managment: Goal: General experience of comfort will improve Outcome: Progressing   
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
## 2020-10-08 NOTE — Anesthesia Procedure Notes (Signed)
Anesthesia Regional Block: Interscalene brachial plexus block   Pre-Anesthetic Checklist: , timeout performed,  Correct Patient, Correct Site, Correct Laterality,  Correct Procedure, Correct Position, site marked,  Risks and benefits discussed,  Surgical consent,  Pre-op evaluation,  At surgeon's request and post-op pain management  Laterality: Right  Prep: chloraprep       Needles:  Injection technique: Single-shot  Needle Type: Echogenic Stimulator Needle     Needle Length: 5cm  Needle Gauge: 22     Additional Needles:   Procedures:,,,, ultrasound used (permanent image in chart),,    Narrative:  Start time: 10/08/2020 8:35 AM End time: 10/08/2020 8:45 AM Injection made incrementally with aspirations every 5 mL.  Performed by: Personally  Anesthesiologist: Catalina Gravel, MD  Additional Notes: Functioning IV was confirmed and monitors were applied.  A 6mm 22ga Arrow echogenic stimulator needle was used. Sterile prep and drape, hand hygiene, and sterile gloves were used.  Negative aspiration and negative test dose prior to incremental administration of local anesthetic. The patient tolerated the procedure well.  Ultrasound guidance: relevent anatomy identified, needle position confirmed, local anesthetic spread visualized around nerve(s), vascular puncture avoided.  Image printed for medical record.

## 2020-10-08 NOTE — Progress Notes (Signed)
Assisted Dr. Hoy Morn with Right interscalene brachial plexus  block. Side rails up, monitors on throughout procedure. See vital signs in flow sheet. Tolerated Procedure well.

## 2020-10-08 NOTE — Anesthesia Procedure Notes (Addendum)
Procedure Name: Intubation Date/Time: 10/08/2020 10:00 AM Performed by: Jari Pigg, CRNA Pre-anesthesia Checklist: Patient identified, Emergency Drugs available, Suction available and Patient being monitored Patient Re-evaluated:Patient Re-evaluated prior to induction Oxygen Delivery Method: Circle system utilized Preoxygenation: Pre-oxygenation with 100% oxygen Induction Type: IV induction Ventilation: Mask ventilation without difficulty Laryngoscope Size: Mac and 4 Grade View: Grade II Tube type: Oral Number of attempts: 1 Airway Equipment and Method: Stylet and Oral airway Placement Confirmation: ETT inserted through vocal cords under direct vision, positive ETCO2 and breath sounds checked- equal and bilateral Secured at: 22 cm Tube secured with: Tape Dental Injury: Teeth and Oropharynx as per pre-operative assessment

## 2020-10-08 NOTE — Discharge Instructions (Addendum)
Discharge Instructions after Total Shoulder Arthroplasty   A sling has been provided for you. Remove the sling 5 times each day to perform motion exercises. After the first 48 to 72 hours, discontinue using the sling. You should use the sling as a protective device, if you are in a crowd.  Use ice on the shoulder intermittently over the first 48 hours after surgery.  Pain medication has been prescribed for you.  Use your medication liberally over the first 48 hours, and then begin to taper your use. You may take Extra Strength Tylenol or Tylenol only in place of the pain pills. DO NOT take ANY nonsteroidal anti-inflammatory pain medications: Advil, Motrin, Ibuprofen, Aleve, Naproxen, or Naprosyn. Resume Xarelto the day after surgery Leave your dressing on until your first follow up visit.  You may shower with the dressing.  Hold your arm as if you still have your sling on while you shower. Active reaching and lifting are not permitted. You may use the operative arm for activities of daily living that do not require the operative arm to leave the side of the body, such as eating, drinking, bathing, etc.  Three to 5 times each day you should perform assisted overhead reaching and external rotation (outward turning) exercises with the operative arm. You were taught these exercises prior to discharge. Both exercises should be done with the non-operative arm used as the "therapist arm" while the operative arm remains relaxed. Ten of each exercise should be done three to five times each day.   Overhead reach is helping to lift your stiff arm up as high as it will go. To stretch your overhead reach, lie flat on your back, relax, and grasp the wrist of the tight shoulder with your opposite hand. Using the power in your opposite arm, bring the stiff arm up as far as it is comfortable. Start holding it for ten seconds and then work up to where you can hold it for a count of 30. Breathe slowly and deeply while the  arm is moved. Repeat this stretch ten times, trying to help the ar up a little higher each time.     External rotation is turning the arm out to the side while your elbow stays close to your body. External rotation is best stretched while you are lying on your back. Hold a cane, yardstick, broom handle, or dowel in both hands. Bend both elbows to a right angle. Use steady, gentle force from your normal arm to rotate the hand of the stiff shoulder out away from your body. Continue the rotation until it is straight in front of you holding it there for a count of 10. Do not go beyond this level of rotation until seen back by Dr. Tamera Punt. Repeat this exercise ten times slowly.      Please call [phone] during normal business hours or [phone] after hours for any problems. Including the following:  - excessive redness of the incisions - drainage for more than 4 days - fever of more than 101.5 F  *Please note that pain medications will not be refilled after hours or on weekends.

## 2020-10-09 ENCOUNTER — Encounter (HOSPITAL_COMMUNITY): Payer: Self-pay | Admitting: Orthopedic Surgery

## 2020-10-09 DIAGNOSIS — M19011 Primary osteoarthritis, right shoulder: Secondary | ICD-10-CM | POA: Diagnosis not present

## 2020-10-09 MED ORDER — OXYCODONE-ACETAMINOPHEN 5-325 MG PO TABS: 1.0000 | ORAL_TABLET | Freq: Four times a day (QID) | ORAL | 0 refills | Status: DC | PRN

## 2020-10-09 MED ORDER — RIVAROXABAN 10 MG PO TABS
20.0000 mg | ORAL_TABLET | Freq: Every day | ORAL | Status: DC
  Administered 2020-10-09: 20 mg via ORAL
  Filled 2020-10-09: qty 2

## 2020-10-09 MED ORDER — TIZANIDINE HCL 2 MG PO TABS: 2.0000 mg | ORAL_TABLET | Freq: Three times a day (TID) | ORAL | 0 refills | Status: DC | PRN

## 2020-10-09 NOTE — Evaluation (Signed)
Occupational Therapy Evaluation Patient Details Name: Nathan Rodriguez MRN: [id number] DOB: 03-21-1946 Today's Date: 10/09/2020    History of Present Illness patient is a 75 year old male who presented to the hospital on 7/21 for a right total shoulder arthroplasty. PMH: L shoulder replacement,anxiety, arthritis CAD   Clinical Impression   Patient is a 74 year old male who was admitted for above diagnosis. s/p shoulder replacement without functional use of right upper extremity secondary to effects of surgery and interscalene block and shoulder precautions. Therapist provided education and instruction to patient  in regards to exercises, precautions, positioning, donning upper extremity clothing and bathing while maintaining shoulder precautions, ice and edema management and donning/doffing sling. Patientverbalized understanding and demonstrated as needed. Patient needed assistance to donn shirt, underwear, pants, socks and shoes and provided with instruction on compensatory strategies to perform ADLs. Patient to follow up with MD for further therapy needs.      Follow Up Recommendations  Follow surgeon's recommendation for DC plan and follow-up therapies    Equipment Recommendations       Recommendations for Other Services       Precautions / Restrictions Precautions Precautions: Shoulder Type of Shoulder Precautions: NO ROM of shoulder AROM R wrist, elbow and hand Shoulder Interventions: Shoulder sling/immobilizer;Off for dressing/bathing/exercises;At all times Restrictions Weight Bearing Restrictions: Yes RUE Weight Bearing: Non weight bearing      Mobility Bed Mobility Overal bed mobility: Modified Independent                  Transfers Overall transfer level: Modified independent                    Balance                                           ADL either performed or assessed with clinical judgement   ADL Overall ADL's : Needs  assistance/impaired Eating/Feeding: Set up;Sitting   Grooming: Oral care;Wash/dry face;Set up   Upper Body Bathing: Minimal assistance   Lower Body Bathing: Moderate assistance   Upper Body Dressing : Moderate assistance;Sitting   Lower Body Dressing: Sit to/from stand;Moderate assistance   Toilet Transfer: Set up   [place] and Hygiene: Minimal assistance Toileting - Clothing Manipulation Details (indicate cue type and reason): patient was educated on using pants with elastic waist v.s. ones with belt and buttons.     Functional mobility during ADLs: Supervision/safety       Vision Baseline Vision/History: Wears glasses Wears Glasses: At all times       Perception     Praxis      Pertinent Vitals/Pain Pain Assessment: No/denies pain (nerve block still in place)     Hand Dominance Left   Extremity/Trunk Assessment Upper Extremity Assessment Upper Extremity Assessment: RUE deficits/detail RUE Deficits / Details: total shoulder replacement with No ROM in shoulder.           Communication Communication Communication: No difficulties   Cognition Arousal/Alertness: Awake/alert Behavior During Therapy: WFL for tasks assessed/performed Overall Cognitive Status: Within Functional Limits for tasks assessed                                 General Comments: patient reported being overwhelmed with information but was able to report back education in teach  back method   General Comments       Exercises     Shoulder Instructions Shoulder Instructions Donning/doffing shirt without moving shoulder: Minimal assistance Method for sponge bathing under operated UE: Modified independent Donning/doffing sling/immobilizer: Minimal assistance Correct positioning of sling/immobilizer: Minimal assistance ROM for elbow, wrist and digits of operated UE: Modified independent Sling wearing schedule (on at all times/off for ADL's): Modified  independent [place] positioning of operated UE when showering: Modified independent Positioning of UE while sleeping: Modified independent    Home Living Family/patient expects to be discharged to:: Private residence Living Arrangements: Spouse/significant other Available Help at Discharge: Family;Available 24 hours/day Type of Home: House Home Access: Level entry                                Prior Functioning/Environment Level of Independence: Independent                 OT Problem List:        OT Treatment/Interventions:      OT Goals(Current goals can be found in the care plan section)    OT Frequency:     Barriers to D/C:            Co-evaluation              AM-PAC OT "6 Clicks" Daily Activity     Outcome Measure Help from another person eating meals?: A Little Help from another person taking care of personal grooming?: A Little Help from another person toileting, which includes using toliet, bedpan, or urinal?: A Lot Help from another person bathing (including washing, rinsing, drying)?: A Lot Help from another person to put on and taking off regular upper body clothing?: A Lot Help from another person to put on and taking off regular lower body clothing?: A Lot 6 Click Score: 14   End of Session Nurse Communication: Other (comment) (consulted on patients status for d/c.)  Activity Tolerance: Patient tolerated treatment well Patient left: in chair;with call bell/phone within reach  OT Visit Diagnosis: Muscle weakness (generalized) (M62.81);Pain Pain - Right/Left: Right Pain - part of body: Shoulder                Time: [id number] OT Time Calculation (min): 34 min Charges:  OT General Charges $OT Visit: 1 Visit OT Evaluation $OT Eval Low Complexity: 1 Low OT Treatments $Self Care/Home Management : 8-22 mins  Jackelyn Poling OTR/L, MS Acute Rehabilitation Department Office# [phone] Pager# [phone]   [place] 10/09/2020, 8:52 AM

## 2020-10-09 NOTE — Progress Notes (Signed)
During bedside report, patient appears agitated, states he needs to take his Xarelto asap as his pulse is fast and he feels short of breath. Patient states he has had a PE in the past and he is anxious about having another one. Lung sounds auscultated, no adventicious sounds, clear throughout, denies chest pain or pain w/ inspiration. Pulse is 110 but patient has been walking halls and reports feeling anxious and agitated.

## 2020-10-09 NOTE — Progress Notes (Signed)
Administered patient's Xarelto. Patient states he does not feel short of breath and reports he was probably just anxious and feels much better having taken the Xarelto. Patient feels confident to go home.

## 2020-10-09 NOTE — Discharge Summary (Signed)
Patient ID: Nathan Rodriguez MRN: [id number] DOB/AGE: [birth year] 75 y.o.  Admit date: 10/08/2020 Discharge date: 10/09/2020  Admission Diagnoses:  Active Problems:   S/P shoulder replacement, right   Discharge Diagnoses:  Same  Past Medical History:  Diagnosis Date   Anxiety    Arthritis    CAD (coronary artery disease)    Complication of anesthesia    hard time to wake up   Coronary artery disease    a. 2012  s/p prior DES x 2 to the LAD;  b. 05/2015 nl stress test [facility]);  c. 6/20217 Cath: LM nl, LAD patent stents, LCX/RCA nl, EF 55-65%.   DDD (degenerative disc disease)    Dyspnea on exertion    a. has been seen by pulmonology - reportedly told that everything nl.   Factor V deficiency ([place])    GERD (gastroesophageal reflux disease)    Hypercholesteremia    Hypertension    Hypertensive heart disease    Nocturia    Occipital headache    sight changes without migraines, for over 30  years   Pre-diabetes    Pulmonary embolism ([place])    Scoliosis    Sleep apnea    mild, CPAP   Tinnitus     Surgeries: Procedure(s): TOTAL SHOULDER ARTHROPLASTY on 10/08/2020   Consultants: None  Discharged Condition: Improved  Hospital Course: Nathan Rodriguez is an 75 y.o. male who was admitted 10/08/2020 for operative treatment of end stage osteoarthritis right shoulder. Patient has severe unremitting pain that affects sleep, daily activities, and work/hobbies. After pre-op clearance the patient was taken to the operating room on 10/08/2020 and underwent  Procedure(s): TOTAL SHOULDER ARTHROPLASTY.    Patient was given perioperative antibiotics:  Anti-infectives (From admission, onward)    Start     Dose/Rate Route Frequency Ordered Stop   10/08/20 0745  ceFAZolin (ANCEF) 2 g in sodium chloride 0.9 % 100 mL IVPB        2 g 200 mL/hr over 30 Minutes Intravenous On call to O.R. 10/08/20 0744 10/08/20 1004        Patient was given sequential compression devices, early  ambulation, and chemoprophylaxis to prevent DVT. Resumed Xarelto post op day one. Patient benefited maximally from hospital stay and there were no complications.    Recent vital signs: Patient Vitals for the past 24 hrs:  BP Temp Temp src Pulse Resp SpO2 Height Weight  10/09/20 0645 129/87 97.9 F (36.6 C) Oral 91 16 94 % -- --  10/09/20 0103 124/87 98.1 F (36.7 C) Oral 91 16 94 % -- --  10/08/20 2101 (!) 137/91 98 F (36.7 C) Oral 98 16 96 % -- --  10/08/20 1801 (!) 132/95 97.6 F (36.4 C) Oral (!) 101 18 94 % -- --  10/08/20 1548 (!) 132/94 97.6 F (36.4 C) Oral 81 16 96 % -- --  10/08/20 1400 (!) 132/99 97.9 F (36.6 C) Oral 69 16 95 % -- --  10/08/20 1345 133/87 -- -- (!) 58 17 96 % -- --  10/08/20 1330 135/83 -- -- (!) 59 15 96 % -- --  10/08/20 1315 131/86 -- -- (!) 58 15 96 % -- --  10/08/20 1300 (!) 139/95 -- -- (!) 58 16 97 % -- --  10/08/20 1250 137/86 -- -- 63 12 95 % -- --  10/08/20 1230 140/88 -- -- 66 (!) 23 92 % -- --  10/08/20 1215 135/89 -- -- 71 16 95 % -- --  10/08/20 1200 (!) 136/92 -- -- 66 18 94 % -- --  10/08/20 1145 127/87 -- -- 71 19 96 % -- --  10/08/20 1134 (!) 121/92 97.7 F (36.5 C) -- 66 18 97 % -- --  10/08/20 0945 (!) 144/98 -- -- 77 13 90 % -- --  10/08/20 0849 (!) 141/95 -- -- 61 20 97 % -- --  10/08/20 0844 (!) 143/100 -- -- 65 16 97 % -- --  10/08/20 0839 -- -- -- 67 12 94 % -- --  10/08/20 0809 -- -- -- -- -- -- 5' 10.5" (1.791 m) 72.1 kg  10/08/20 0759 127/88 97.7 F (36.5 C) Oral 66 15 96 % -- --        Discharge Medications:   Allergies as of 10/09/2020   No Known Allergies      Medication List     STOP taking these medications    ibuprofen 200 MG tablet Commonly known as: ADVIL       TAKE these medications    acetaminophen 325 MG tablet Commonly known as: TYLENOL Take 650-975 mg by mouth every 6 (six) hours as needed for moderate pain or headache.   aluminum hydroxide-magnesium carbonate 95-358 MG/15ML  Susp Commonly known as: GAVISCON Take 15 mLs by mouth as needed for indigestion or heartburn.   BENEFIBER DRINK MIX PO Take 1 packet by mouth daily as needed (constipation).   CO Q 10 PO Take 1 tablet by mouth daily. With turmeric curcumin   hydrocortisone valerate cream 0.2 % Commonly known as: WESTCORT Apply 1 application topically 2 (two) times daily as needed (irritation).   Magnesium 250 MG Tabs Take 250 mg by mouth daily.   Melatonin 10 MG Caps Take 10 mg by mouth at bedtime.   multivitamin with minerals Tabs tablet Take 1 tablet by mouth daily.   Omega-3 1000 MG Caps Take 1,000 mg by mouth daily.   omeprazole 20 MG capsule Commonly known as: PRILOSEC Take 20 mg by mouth daily.   OVER THE COUNTER MEDICATION Take 1 Dose by mouth daily. Super beets   OVER THE COUNTER MEDICATION Take 3 capsules by mouth daily. Balance of Nature Fruit   OVER THE COUNTER MEDICATION Take 3 capsules by mouth daily. Balance of Nature vegetables   oxyCODONE-acetaminophen 5-325 MG tablet Commonly known as: Percocet Take 1 tablet by mouth every 6 (six) hours as needed for severe pain.   rivaroxaban 20 MG Tabs tablet Commonly known as: XARELTO Take 20 mg by mouth daily with supper.   tiZANidine 2 MG tablet Commonly known as: ZANAFLEX Take 1 tablet (2 mg total) by mouth every 8 (eight) hours as needed for muscle spasms.       ASK your doctor about these medications    metoprolol tartrate 25 MG tablet Commonly known as: LOPRESSOR Take 1.5 tablets (37.5 mg total) by mouth daily.   Repatha SureClick [id number] MG/ML [place] Generic drug: Evolocumab INJECT 140 MG INTO THE SKIN EVERY 14 DAYS        Diagnostic Studies: DG Chest 2 View  Result Date: 10/02/2020 CLINICAL DATA:  Preoperative evaluation EXAM: CHEST - 2 VIEW COMPARISON:  2014 FINDINGS: Shallow inspiration. Lungs are clear. No pleural effusion. Cardiomediastinal contours are within normal limits. No acute osseous abnormality.  IMPRESSION: No acute process in the chest. Electronically Signed   By: Macy Mis M.D.   On: 10/02/2020 08:30   DG Shoulder Right Port  Result Date: 10/08/2020 CLINICAL DATA:  Status post right  shoulder replacement. EXAM: PORTABLE RIGHT SHOULDER COMPARISON:  None. FINDINGS: Right glenohumeral arthroplasty in expected alignment. There is no periprosthetic lucency or fracture. Recent postsurgical change includes air and edema in the soft tissues. IMPRESSION: Right glenohumeral arthroplasty without immediate postoperative complication. Electronically Signed   By: Keith Rake M.D.   On: 10/08/2020 14:41    Disposition: Discharge disposition: 01-Home or Self Care       Discharge Instructions     Call MD / Call 911   Complete by: As directed    If you experience chest pain or shortness of breath, CALL 911 and be transported to the hospital emergency room.  If you develope a fever above 101 F, pus (white drainage) or increased drainage or redness at the wound, or calf pain, call your surgeon's office.   Constipation Prevention   Complete by: As directed    Drink plenty of fluids.  Prune juice may be helpful.  You may use a stool softener, such as Colace (over the counter) 100 mg twice a day.  Use MiraLax (over the counter) for constipation as needed.   Diet - low sodium heart healthy   Complete by: As directed    Discharge instructions   Complete by: As directed    Discharge Instructions after Total Shoulder Arthroplasty    A sling has been provided for you. Remove the sling 5 times each day to perform motion exercises. After the first 48 to 72 hours, discontinue using the sling. You should use the sling as a protective device, if you are in a crowd.   Use ice on the shoulder intermittently over the first 48 hours after surgery.   Pain medication has been prescribed for you.   Use your medication liberally over the first 48 hours, and then begin to taper your use. You may take  Extra Strength Tylenol or Tylenol only in place of the pain pills. DO NOT take ANY nonsteroidal anti-inflammatory pain medications: Advil, Motrin, Ibuprofen, Aleve, Naproxen, or Naprosyn.  Resume Xarelto the day after surgery  Leave your dressing on until your first follow up visit.  You may shower with the dressing.  Hold your arm as if you still have your sling on while you shower.  Active reaching and lifting are not permitted. You may use the operative arm for activities of daily living that do not require the operative arm to leave the side of the body, such as eating, drinking, bathing, etc.   Three to 5 times each day you should perform assisted overhead reaching and external rotation (outward turning) exercises with the operative arm. You were taught these exercises prior to discharge. Both exercises should be done with the non-operative arm used as the "therapist arm" while the operative arm remains relaxed. Ten of each exercise should be done three to five times each day.   Overhead reach is helping to lift your stiff arm up as high as it will go. To stretch your overhead reach, lie flat on your back, relax, and grasp the wrist of the tight shoulder with your opposite hand. Using the power in your opposite arm, bring the stiff arm up as far as it is comfortable. Start holding it for ten seconds and then work up to where you can hold it for a count of 30. Breathe slowly and deeply while the arm is moved. Repeat this stretch ten times, trying to help the ar up a little higher each time.      External rotation is  turning the arm out to the side while your elbow stays close to your body. External rotation is best stretched while you are lying on your back. Hold a cane, yardstick, broom handle, or dowel in both hands. Bend both elbows to a right angle. Use steady, gentle force from your normal arm to rotate the hand of the stiff shoulder out away from your body. Continue the rotation until it is  straight in front of you holding it there for a count of 10. Do not go beyond this level of rotation until seen back by Dr. Tamera Punt. Repeat this exercise ten times slowly.       Please call [phone] during normal business hours or [phone] after hours for any problems. Including the following:  - excessive redness of the incisions - drainage for more than 4 days - fever of more than 101.5 F  *Please note that pain medications will not be refilled after hours or on weekends.   Post-operative opioid taper instructions:   Complete by: As directed    POST-OPERATIVE OPIOID TAPER INSTRUCTIONS: It is important to wean off of your opioid medication as soon as possible. If you do not need pain medication after your surgery it is ok to stop day one. Opioids include: Codeine, Hydrocodone(Norco, Vicodin), Oxycodone(Percocet, oxycontin) and hydromorphone amongst others.  Long term and even short term use of opiods can cause: Increased pain response Dependence Constipation Depression Respiratory depression And more.  Withdrawal symptoms can include Flu like symptoms Nausea, vomiting And more Techniques to manage these symptoms Hydrate well Eat regular healthy meals Stay active Use relaxation techniques(deep breathing, meditating, yoga) Do Not substitute Alcohol to help with tapering If you have been on opioids for less than two weeks and do not have pain than it is ok to stop all together.  Plan to wean off of opioids This plan should start within one week post op of your joint replacement. Maintain the same interval or time between taking each dose and first decrease the dose.  Cut the total daily intake of opioids by one tablet each day Next start to increase the time between doses. The last dose that should be eliminated is the evening dose.           Follow-up Information     Tania Ade, MD. Schedule an appointment as soon as possible for a visit in 2 week(s).    Specialty: Orthopedic Surgery Contact information: [place] [phone]                  Signed: Luetta Nutting L. [place], PA-C 10/09/2020, 7:56 AM

## 2020-10-09 NOTE — Progress Notes (Signed)
PATIENT ID: Nathan Rodriguez  MRN: [id number]  DOB/AGE:  July 15, 1945 / 75 y.o.  1 Day Post-Op Procedure(s) (LRB): TOTAL SHOULDER ARTHROPLASTY (Right)  Subjective: Pain is mild.  No c/o chest pain or SOB. He has been quite eager to get up and move. Concerned about being immobile and having held Xarelto. Voiding well. Positive flatus. Patient walking around the hall during rounds.    Objective: Vital signs in last 24 hours: Temp:  [97.6 F (36.4 C)-98.1 F (36.7 C)] 97.9 F (36.6 C) (07/22 0645) Pulse Rate:  [58-101] 91 (07/22 0645) Resp:  [12-23] 16 (07/22 0645) BP: (121-144)/(83-100) 129/87 (07/22 0645) SpO2:  [90 %-97 %] 94 % (07/22 0645) Weight:  [72.1 kg] 72.1 kg (07/21 0809)  Intake/Output from previous day: 07/21 0701 - 07/22 0700 In: 3160 [P.O.:860; I.V.:2300] Out: 2600 [Urine:2550; Blood:50] Intake/Output this shift: No intake/output data recorded.   Physical Exam: Neurologically intact Sensation intact distally Intact pulses distally Incision: dressing C/D/I Able to move fingers and wrist some, block still somewhat in place  Assessment/Plan: 1 Day Post-Op Procedure(s) (LRB): TOTAL SHOULDER ARTHROPLASTY (Right)   Advance diet Up with therapy D/C IV fluids Non Weight Bearing (NWB) right UE  VTE prophylaxis:  resume Xarelto  Patient is doing well. Has had some tachycardia but no SOB and no chest pain. Patient was able to talk with me and walk without issue. Will regain full function of right wrist and fingers as block continues to wear off. OT today for sling management. Discharge instructions given. Follow up in office in 2 weeks with Dr Tamera Punt.    Kinlee Garrison L. [place], PA-C 10/09/2020, 7:48 AM

## 2020-10-09 NOTE — Progress Notes (Signed)
Reviewed d/c instructions w/ patient, verbalized understanding of instructions. Patient continues to deny SOB or chest pain. Verbalized symptoms of PE and feels confident to go home.

## 2020-10-29 ENCOUNTER — Telehealth: Payer: Self-pay

## 2020-10-29 NOTE — Telephone Encounter (Signed)
   [facility] Pre-operative Risk Assessment    Patient Name: Nathan Rodriguez  DOB: [birth year] MRN: [id number]  HEARTCARE STAFF:  - IMPORTANT!!!!!! Under Visit Info/Reason for Call, type in Other and utilize the format Clearance MM/DD/YY or Clearance TBD. Do not use dashes or single digits. - Please review there is not already an duplicate clearance open for this procedure. - If request is for dental extraction, please clarify the # of teeth to be extracted. - If the patient is currently at the dentist's office, call Pre-Op Callback Staff (MA/nurse) to input urgent request.  - If the patient is not currently in the dentist office, please route to the Pre-Op pool.  Request for surgical clearance:  What type of surgery is being performed? Oral Surgery  When is this surgery scheduled? TBD  What type of clearance is required (medical clearance vs. Pharmacy clearance to hold med vs. Both)? BOTH  Are there any medications that need to be held prior to surgery and how long? Xarelto  Practice name and name of physician performing surgery?  [facility]  Dr Janyth Contes  What is the office phone number?  [phone]   7.   What is the office fax number?  [phone]  8.   Anesthesia type (None, local, MAC, general) ? Local   Toni Arthurs 10/29/2020, 2:36 PM  _________________________________________________________________   (provider comments below)

## 2020-10-29 NOTE — Telephone Encounter (Signed)
Needs additional details on what surgical procedure is being performed. Will route to callback to assist. Thanks.

## 2020-10-30 NOTE — Telephone Encounter (Signed)
   Patient Name: Nathan Rodriguez  DOB: September 17, 1945 MRN: [id number]  Primary Cardiologist: Quay Burow, MD  Chart reviewed as part of pre-operative protocol coverage.   Simple dental extractions are considered low risk procedures per guidelines and generally do not require any specific cardiac clearance. It is also generally accepted that for simple extractions and dental cleanings, there is no need to interrupt blood thinner therapy.   SBE prophylaxis is not required for the patient from a cardiac standpoint.  I will route this recommendation to the requesting party via Epic fax function and remove from pre-op pool.  Please call with questions.  Abigail Butts, PA-C 10/30/2020, 8:55 AM

## 2020-10-30 NOTE — Telephone Encounter (Signed)
I s/w the dental office today and confirmed the pt is having only 1 tooth extracted with intra socket dressing.

## 2020-11-23 ENCOUNTER — Other Ambulatory Visit: Payer: Self-pay | Admitting: Student

## 2020-11-23 DIAGNOSIS — R002 Palpitations: Secondary | ICD-10-CM

## 2020-12-01 ENCOUNTER — Telehealth: Payer: Self-pay

## 2020-12-01 NOTE — Telephone Encounter (Signed)
   [facility] Pre-operative Risk Assessment    Patient Name: Nathan Rodriguez  DOB: [birth year] MRN: [id number]  HEARTCARE STAFF:  - IMPORTANT!!!!!! Under Visit Info/Reason for Call, type in Other and utilize the format Clearance MM/DD/YY or Clearance TBD. Do not use dashes or single digits. - Please review there is not already an duplicate clearance open for this procedure. - If request is for dental extraction, please clarify the # of teeth to be extracted. - If the patient is currently at the dentist's office, call Pre-Op Callback Staff (MA/nurse) to input urgent request.  - If the patient is not currently in the dentist office, please route to the Pre-Op pool.  Request for surgical clearance:  What type of surgery is being performed? LEFT KNEE ARTHROSCOPY  When is this surgery scheduled? 12-28-2020  What type of clearance is required (medical clearance vs. Pharmacy clearance to hold med vs. Both)? BOTH  Are there any medications that need to be held prior to surgery and how long? PT Scott name and name of physician performing surgery? [facility] ATTN: JUDY  What is the office phone number? [phone]   7.   What is the office fax number? [phone]  8.   Anesthesia type (None, local, MAC, general) ? CHOICE   Waylan Rocher 12/01/2020, 4:50 PM

## 2020-12-02 NOTE — Telephone Encounter (Signed)
Left VM 9/14  Hx of competitive cycling.   On xarelto for hx of DVT/PE.   Hx of CAD.

## 2020-12-10 NOTE — Telephone Encounter (Signed)
Left voice mail to call back 

## 2020-12-10 NOTE — Telephone Encounter (Signed)
   Name: Nathan Rodriguez  DOB: February 05, 1946  MRN: [id number]   Primary Cardiologist: Quay Burow, MD  Chart reviewed as part of pre-operative protocol coverage. Patient was contacted 12/10/2020 in reference to pre-operative risk assessment for pending surgery as outlined below.  Nathan Rodriguez was last seen on 01/2020 by Dr. Gwenlyn Found.  Since that day, Nathan Rodriguez has done well.  Patient does regular exercise and bicycle riding without any issue.  Therefore, based on ACC/AHA guidelines, the patient would be at acceptable risk for the planned procedure without further cardiovascular testing.   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  This is managed by PCP.  The patient was advised that if he develops new symptoms prior to surgery to contact our office to arrange for a follow-up visit, and he verbalized understanding.  I will route this recommendation to the requesting party via Epic fax function and remove from pre-op pool. Please call with questions.  [place], [place] 12/10/2020, 9:51 AM

## 2020-12-14 NOTE — Telephone Encounter (Signed)
   Pt said, he reached out to his pcp and he was told the Dr. [name] still needs Dr. Kennon Holter recommendation about Xarelto. Dr. Tamala Julian needs Dr. Gwenlyn Found output to determine when pt can held xarelto prior procedure

## 2020-12-14 NOTE — Telephone Encounter (Signed)
Will route to callback team to reach out to patient or primary care that he is not on Xarelto for cardiology reasons. Since this is for DVT/PE, would suggest the clearance to hold come from PCP. Dr. Gwenlyn Found does not manage this medicine or condition for him. There are no specific cardiac conditions which would preclude him coming off this, so just needs PCP clearance for the original indication.

## 2020-12-14 NOTE — Telephone Encounter (Signed)
I will forward notes to requesting office as well as PCP to please see notes per Melina Copa, PAC pre op provider today. I will remove from the pre op call back pool.

## 2020-12-24 ENCOUNTER — Other Ambulatory Visit: Payer: Self-pay

## 2020-12-24 ENCOUNTER — Ambulatory Visit: Payer: Self-pay

## 2020-12-24 ENCOUNTER — Ambulatory Visit: Payer: Medicare Other | Admitting: Sports Medicine

## 2020-12-24 VITALS — BP 120/84 | HR 70 | Ht 70.5 in | Wt 161.0 lb

## 2020-12-24 DIAGNOSIS — M25561 Pain in right knee: Secondary | ICD-10-CM | POA: Diagnosis not present

## 2020-12-24 DIAGNOSIS — M1712 Unilateral primary osteoarthritis, left knee: Secondary | ICD-10-CM

## 2020-12-24 DIAGNOSIS — G8929 Other chronic pain: Secondary | ICD-10-CM

## 2020-12-24 DIAGNOSIS — M25562 Pain in left knee: Secondary | ICD-10-CM | POA: Diagnosis not present

## 2020-12-24 DIAGNOSIS — M1711 Unilateral primary osteoarthritis, right knee: Secondary | ICD-10-CM | POA: Diagnosis not present

## 2020-12-24 NOTE — Progress Notes (Signed)
Nathan Rodriguez D.[facility] [place] Phone: [phone]   Assessment and Plan:     1. Chronic pain of both knees 2. Primary osteoarthritis of left knee 3. Primary osteoarthritis of right knee -Acute on chronic, subsequent sports medicine visit - Likely acute flare of chronic bilateral osteoarthritis both knees - Patient elects for CSI at today's visit.  Tolerated well per procedure note below - If no improvement or worsening of symptoms at 4-week follow-up, would consider HA injections at that time - Start HEP for knee OA - Tylenol/NSAIDs as needed for pain control   Procedure: Knee Joint Injection Side: Bilateral Indication: Acute flare of chronic OA  Risks explained and consent was given verbally. The site was cleaned with alcohol prep. A needle was introduced with an anterio-lateral approach. Injection given using 75mL of 1% lidocaine without epinephrine and 58mL of kenalog 40mg /ml. This was well tolerated and resulted in symptomatic relief.  Needle was removed, hemostasis achieved, and post injection instructions were explained.  Procedure was repeated for contralateral side.  Pt was advised to call or return to clinic if these symptoms worsen or fail to improve as anticipated.  Pertinent previous records reviewed include previous office visit   Follow Up: In 4 weeks for reevaluation   Subjective:   I, Nathan Rodriguez, am serving as a scribe for Dr. Glennon Mac  Chief Complaint: Knee and shoulder pain   HPI:   12/24/20 Patient is a 75 year old male presenting with knee pain. Patient states that he has bilateral knee pain and talked to [facility] about the pain and they stated he would need a knee replacement in the Left and an arthroscopy in the right. Patient is here for a second opinion as he would like to avoid that as much as possible. Patient states that walking causes him pain now as well as his  normal weighted squats and lunges workout. Patient states that he received steroid injections that did not seem to help. Since then patients knees have gotten worse.     Bilateral Knee Location: Right lateral knee, Left knee pain above and below the knee cap.  Swelling: no Radiates: no Mechanical symptoms:no     Relevant Historical Information: CAD, OA bilateral knees, right shoulder replacement, history DVT  Additional pertinent review of systems negative.   Current Outpatient Medications:    acetaminophen (TYLENOL) 325 MG tablet, Take 650-975 mg by mouth every 6 (six) hours as needed for moderate pain or headache., Disp: , Rfl:    aluminum hydroxide-magnesium carbonate (GAVISCON) 95-358 MG/15ML SUSP, Take 15 mLs by mouth as needed for indigestion or heartburn., Disp: , Rfl:    Coenzyme Q10 (CO Q 10 PO), Take 1 tablet by mouth daily. With turmeric curcumin, Disp: , Rfl:    Evolocumab (REPATHA SURECLICK) 295 MG/ML SOAJ, INJECT 140 MG INTO THE SKIN EVERY 14 DAYS (Patient taking differently: Inject 140 mg as directed every 14 (fourteen) days.), Disp: 2 mL, Rfl: 11   hydrocortisone valerate cream (WESTCORT) 0.2 %, Apply 1 application topically 2 (two) times daily as needed (irritation)., Disp: , Rfl:    Magnesium 250 MG TABS, Take 250 mg by mouth daily., Disp: , Rfl:    Melatonin 10 MG CAPS, Take 10 mg by mouth at bedtime., Disp: , Rfl:    metoprolol tartrate (LOPRESSOR) 25 MG tablet, Take 1 tablet (25 mg total) by mouth daily. Pt needs to schedule an appt. For future refills.  Thank you. 1 attempt., Disp: 45 tablet, Rfl: 0   Multiple Vitamin (MULTIVITAMIN WITH MINERALS) TABS tablet, Take 1 tablet by mouth daily., Disp: , Rfl:    Omega-3 1000 MG CAPS, Take 1,000 mg by mouth daily., Disp: , Rfl:    omeprazole (PRILOSEC) 20 MG capsule, Take 20 mg by mouth daily., Disp: , Rfl:    OVER THE COUNTER MEDICATION, Take 1 Dose by mouth daily. Super beets, Disp: , Rfl:    OVER THE COUNTER MEDICATION,  Take 3 capsules by mouth daily. Balance of Nature Fruit, Disp: , Rfl:    OVER THE COUNTER MEDICATION, Take 3 capsules by mouth daily. Balance of Nature vegetables, Disp: , Rfl:    rivaroxaban (XARELTO) 20 MG TABS tablet, Take 20 mg by mouth daily with supper., Disp: , Rfl:    Wheat Dextrin (BENEFIBER DRINK MIX PO), Take 1 packet by mouth daily as needed (constipation)., Disp: , Rfl:    oxyCODONE-acetaminophen (PERCOCET) 5-325 MG tablet, Take 1 tablet by mouth every 6 (six) hours as needed for severe pain. (Patient not taking: Reported on 12/24/2020), Disp: 28 tablet, Rfl: 0   tiZANidine (ZANAFLEX) 2 MG tablet, Take 1 tablet (2 mg total) by mouth every 8 (eight) hours as needed for muscle spasms. (Patient not taking: Reported on 12/24/2020), Disp: 20 tablet, Rfl: 0   Objective:     Vitals:   12/24/20 1520  BP: 120/84  Pulse: 70  SpO2: 98%  Weight: 161 lb (73 kg)  Height: 5' 10.5" (1.791 m)      Body mass index is 22.77 kg/m.    Physical Exam:    General:  awake, alert oriented, no acute distress nontoxic Skin: no suspicious lesions or rashes Neuro:sensation intact, no deficits, strength 5/5 with no deficits, no atrophy, normal muscle tone Psych: No signs of anxiety, depression or other mood disorder  Bilateral knee: Bilateral crepitus No swelling No deformity Neg fluid wave, joint milking ROM Flex 90, Ext 15 NTTP over the quad tendon, medial fem condyle, lat fem condyle, patella, plica, patella tendon, tibial tuberostiy, fibular head, posterior fossa, pes anserine bursa, gerdy's tubercle, medial jt line, lateral jt line Neg anterior and posterior drawer Neg lachman Neg sag sign Negative varus stress Negative valgus stress Negative McMurray  Gait normal    Electronically signed by:  Nathan Rodriguez D.[facility] 3:50 PM 12/24/20

## 2020-12-24 NOTE — Patient Instructions (Addendum)
Good to see you  Knee injections given today Knee exercises given  See me again in 4 weeks

## 2021-01-20 NOTE — Progress Notes (Deleted)
Nathan Rodriguez D.[place] Phone: [phone]   Assessment and Plan:     There are no diagnoses linked to this encounter.  ***   Pertinent previous records reviewed include ***   Follow Up: ***     Subjective:    Chief Complaint: bilateral knee pain   HPI:     12/24/20 Patient is a 75 year old male presenting with knee pain. Patient states that he has bilateral knee pain and talked to [facility] about the pain and they stated he would need a knee replacement in the Left and an arthroscopy in the right. Patient is here for a second opinion as he would like to avoid that as much as possible. Patient states that walking causes him pain now as well as his normal weighted squats and lunges workout. Patient states that he received steroid injections that did not seem to help. Since then patients knees have gotten worse.   01/21/21 Patient states   Relevant Historical Information: ***  Additional pertinent review of systems negative.   Current Outpatient Medications:    acetaminophen (TYLENOL) 325 MG tablet, Take 650-975 mg by mouth every 6 (six) hours as needed for moderate pain or headache., Disp: , Rfl:    aluminum hydroxide-magnesium carbonate (GAVISCON) 95-358 MG/15ML SUSP, Take 15 mLs by mouth as needed for indigestion or heartburn., Disp: , Rfl:    Coenzyme Q10 (CO Q 10 PO), Take 1 tablet by mouth daily. With turmeric curcumin, Disp: , Rfl:    Evolocumab (REPATHA SURECLICK) 182 MG/ML SOAJ, INJECT 140 MG INTO THE SKIN EVERY 14 DAYS (Patient taking differently: Inject 140 mg as directed every 14 (fourteen) days.), Disp: 2 mL, Rfl: 11   hydrocortisone valerate cream (WESTCORT) 0.2 %, Apply 1 application topically 2 (two) times daily as needed (irritation)., Disp: , Rfl:    Magnesium 250 MG TABS, Take 250 mg by mouth daily., Disp: , Rfl:    Melatonin 10 MG CAPS, Take 10 mg by mouth at bedtime., Disp: ,  Rfl:    metoprolol tartrate (LOPRESSOR) 25 MG tablet, Take 1 tablet (25 mg total) by mouth daily. Pt needs to schedule an appt. For future refills. Thank you. 1 attempt., Disp: 45 tablet, Rfl: 0   Multiple Vitamin (MULTIVITAMIN WITH MINERALS) TABS tablet, Take 1 tablet by mouth daily., Disp: , Rfl:    Omega-3 1000 MG CAPS, Take 1,000 mg by mouth daily., Disp: , Rfl:    omeprazole (PRILOSEC) 20 MG capsule, Take 20 mg by mouth daily., Disp: , Rfl:    OVER THE COUNTER MEDICATION, Take 1 Dose by mouth daily. Super beets, Disp: , Rfl:    OVER THE COUNTER MEDICATION, Take 3 capsules by mouth daily. Balance of Nature Fruit, Disp: , Rfl:    OVER THE COUNTER MEDICATION, Take 3 capsules by mouth daily. Balance of Nature vegetables, Disp: , Rfl:    oxyCODONE-acetaminophen (PERCOCET) 5-325 MG tablet, Take 1 tablet by mouth every 6 (six) hours as needed for severe pain. (Patient not taking: Reported on 12/24/2020), Disp: 28 tablet, Rfl: 0   rivaroxaban (XARELTO) 20 MG TABS tablet, Take 20 mg by mouth daily with supper., Disp: , Rfl:    tiZANidine (ZANAFLEX) 2 MG tablet, Take 1 tablet (2 mg total) by mouth every 8 (eight) hours as needed for muscle spasms. (Patient not taking: Reported on 12/24/2020), Disp: 20 tablet, Rfl: 0   Wheat Dextrin (BENEFIBER DRINK MIX PO), Take  1 packet by mouth daily as needed (constipation)., Disp: , Rfl:    Objective:     There were no vitals filed for this visit.    There is no height or weight on file to calculate BMI.    Physical Exam:    ***   Electronically signed by:  Nathan Rodriguez D.[facility] 3:24 PM 01/20/21

## 2021-01-21 ENCOUNTER — Ambulatory Visit: Payer: Medicare Other | Admitting: Sports Medicine

## 2021-01-23 ENCOUNTER — Other Ambulatory Visit: Payer: Self-pay | Admitting: Student

## 2021-01-23 DIAGNOSIS — R002 Palpitations: Secondary | ICD-10-CM

## 2021-01-27 ENCOUNTER — Ambulatory Visit: Payer: Medicare Other | Admitting: Sports Medicine

## 2021-01-27 ENCOUNTER — Other Ambulatory Visit: Payer: Self-pay

## 2021-01-27 VITALS — BP 110/82 | HR 64 | Ht 70.5 in | Wt 163.0 lb

## 2021-01-27 DIAGNOSIS — M170 Bilateral primary osteoarthritis of knee: Secondary | ICD-10-CM | POA: Diagnosis not present

## 2021-01-27 DIAGNOSIS — M1712 Unilateral primary osteoarthritis, left knee: Secondary | ICD-10-CM | POA: Diagnosis not present

## 2021-01-27 DIAGNOSIS — M25561 Pain in right knee: Secondary | ICD-10-CM

## 2021-01-27 DIAGNOSIS — G8929 Other chronic pain: Secondary | ICD-10-CM | POA: Diagnosis not present

## 2021-01-27 DIAGNOSIS — M1711 Unilateral primary osteoarthritis, right knee: Secondary | ICD-10-CM | POA: Diagnosis not present

## 2021-01-27 DIAGNOSIS — M25562 Pain in left knee: Secondary | ICD-10-CM | POA: Diagnosis not present

## 2021-01-27 NOTE — Progress Notes (Signed)
Nathan Rodriguez D.[place] Phone: [phone]   Assessment and Plan:     1. Chronic pain of both knees 2. Primary osteoarthritis of left knee 3. Primary osteoarthritis of right knee -Chronic with exacerbation, subsequent visit - Patient failed intra-articular CSI therapy, receiving only mild improvement for about 1 week and now currently having worsening pain than he did prior to injections - Patient elected to have bilateral HA injections.  Tolerated well per note below - Continue activity modification and Tylenol as needed for pain relief -We will also write a prescription for patient to have a [employer] stair lift placed at his house.  Per note provided patient needs the following stated  "Stair lift needed for: Nathan Rodriguez, 01-02-1946, [employer] customer number: [phone]"   Pertinent previous records reviewed include none  Procedure: Knee Joint Injection Side: Bilateral Indication: Bilateral knee OA with exacerbation  Risks explained and consent was given verbally. The site was cleaned with alcohol prep. A needle was introduced with an anterio-lateral approach. Injection given using 3 mL [place]. This was well tolerated and resulted in symptomatic relief.  Needle was removed, hemostasis achieved, and post injection instructions were explained.  Procedure was repeated on contralateral side.  Pt was advised to call or return to clinic if these symptoms worsen or fail to improve as anticipated.    Follow Up: 4 weeks for reevaluation.  If no improvement, could consider PRP injections versus referral for geniculate nerve ablation, however patient appears more likely to wants knee replacements if no improvement follow-up   Subjective:   I, Judy Pimple, am serving as a scribe for Dr. Glennon Mac  Chief Complaint: Bilateral knee pain follow up   HPI:   12/24/20 Patient is a 75 year old male presenting  with knee pain. Patient states that he has bilateral knee pain and talked to [facility] about the pain and they stated he would need a knee replacement in the Left and an arthroscopy in the right. Patient is here for a second opinion as he would like to avoid that as much as possible. Patient states that walking causes him pain now as well as his normal weighted squats and lunges workout. Patient states that he received steroid injections that did not seem to help. Since then patients knees have gotten worse.   01/27/21 Patient states that his knee pain is worse than what it was at the last visit. States it is getting painful to walk. Patient states the injection seemed to help a little but the relief did not last long.   Relevant Historical Information: CAD, OA bilateral knees, right shoulder replacement, history of DVT  Additional pertinent review of systems negative.   Current Outpatient Medications:    acetaminophen (TYLENOL) 325 MG tablet, Take 650-975 mg by mouth every 6 (six) hours as needed for moderate pain or headache., Disp: , Rfl:    aluminum hydroxide-magnesium carbonate (GAVISCON) 95-358 MG/15ML SUSP, Take 15 mLs by mouth as needed for indigestion or heartburn., Disp: , Rfl:    Coenzyme Q10 (CO Q 10 PO), Take 1 tablet by mouth daily. With turmeric curcumin, Disp: , Rfl:    Evolocumab (REPATHA SURECLICK) 742 MG/ML SOAJ, INJECT 140 MG INTO THE SKIN EVERY 14 DAYS (Patient taking differently: Inject 140 mg as directed every 14 (fourteen) days.), Disp: 2 mL, Rfl: 11   hydrocortisone valerate cream (WESTCORT) 0.2 %, Apply 1 application topically 2 (two) times daily as  needed (irritation)., Disp: , Rfl:    Magnesium 250 MG TABS, Take 250 mg by mouth daily., Disp: , Rfl:    Melatonin 10 MG CAPS, Take 10 mg by mouth at bedtime., Disp: , Rfl:    metoprolol tartrate (LOPRESSOR) 25 MG tablet, TAKE 1 TABLET BY MOUTH ONCE DAILY . APPOINTMENT REQUIRED FOR FUTURE REFILLS, Disp: 45 tablet, Rfl:  0   Multiple Vitamin (MULTIVITAMIN WITH MINERALS) TABS tablet, Take 1 tablet by mouth daily., Disp: , Rfl:    Omega-3 1000 MG CAPS, Take 1,000 mg by mouth daily., Disp: , Rfl:    omeprazole (PRILOSEC) 20 MG capsule, Take 20 mg by mouth daily., Disp: , Rfl:    OVER THE COUNTER MEDICATION, Take 1 Dose by mouth daily. Super beets, Disp: , Rfl:    OVER THE COUNTER MEDICATION, Take 3 capsules by mouth daily. Balance of Nature Fruit, Disp: , Rfl:    OVER THE COUNTER MEDICATION, Take 3 capsules by mouth daily. Balance of Nature vegetables, Disp: , Rfl:    oxyCODONE-acetaminophen (PERCOCET) 5-325 MG tablet, Take 1 tablet by mouth every 6 (six) hours as needed for severe pain., Disp: 28 tablet, Rfl: 0   rivaroxaban (XARELTO) 20 MG TABS tablet, Take 20 mg by mouth daily with supper., Disp: , Rfl:    tiZANidine (ZANAFLEX) 2 MG tablet, Take 1 tablet (2 mg total) by mouth every 8 (eight) hours as needed for muscle spasms., Disp: 20 tablet, Rfl: 0   Wheat Dextrin (BENEFIBER DRINK MIX PO), Take 1 packet by mouth daily as needed (constipation)., Disp: , Rfl:    Objective:     Vitals:   01/27/21 1325  BP: 110/82  Pulse: 64  SpO2: 98%  Weight: 163 lb (73.9 kg)  Height: 5' 10.5" (1.791 m)      Body mass index is 23.06 kg/m.    Physical Exam:    General:  awake, alert oriented, no acute distress nontoxic Skin: no suspicious lesions or rashes Neuro:sensation intact, no deficits, strength 5/5 with no deficits, no atrophy, normal muscle tone Psych: No signs of anxiety, depression or other mood disorder   Bilateral knee: Bilateral crepitus No swelling No deformity Neg fluid wave, joint milking ROM Flex 90, Ext 15 NTTP over the quad tendon, medial fem condyle, lat fem condyle, patella, plica, patella tendon, tibial tuberostiy, fibular head, posterior fossa, pes anserine bursa, gerdy's tubercle, medial jt line, lateral jt line Neg anterior and posterior drawer Neg lachman Neg sag sign Negative varus  stress Negative valgus stress Negative McMurray   Gait normal    Electronically signed by:  Nathan Rodriguez D.[facility] 2:04 PM 01/27/21

## 2021-01-27 NOTE — Patient Instructions (Addendum)
Good to see you  Gel injection in both knees today (Durolane) Follow up in 4 weeks for re-evaluation

## 2021-01-28 ENCOUNTER — Other Ambulatory Visit: Payer: Self-pay

## 2021-01-28 DIAGNOSIS — M1712 Unilateral primary osteoarthritis, left knee: Secondary | ICD-10-CM

## 2021-01-28 DIAGNOSIS — M1711 Unilateral primary osteoarthritis, right knee: Secondary | ICD-10-CM

## 2021-01-28 DIAGNOSIS — G8929 Other chronic pain: Secondary | ICD-10-CM

## 2021-01-28 DIAGNOSIS — M25561 Pain in right knee: Secondary | ICD-10-CM

## 2021-01-28 NOTE — Progress Notes (Signed)
Patient needed an prescription written for a stairlift to be installed in his house. Rx written and faxed with the form provided at the office visit on 01/27/21. Fax number [phone] or email [email or web address]. Patients [employer] customer/inquiry number is [id number]

## 2021-02-03 IMAGING — XA Imaging study
2 series · 2 of 2 positions shown · non-contrast
Comparison: none

CLINICAL DATA: Lumbosacral spondylosis without myelopathy. Left low
back and lateral hip pain. Multilevel disc degeneration, severe at
L2-3 with left lateral recess and left neural foraminal stenosis.

[Series 1: ortho standard · 1 of 1 slices shown (1 of 2)]
[im 1/1]
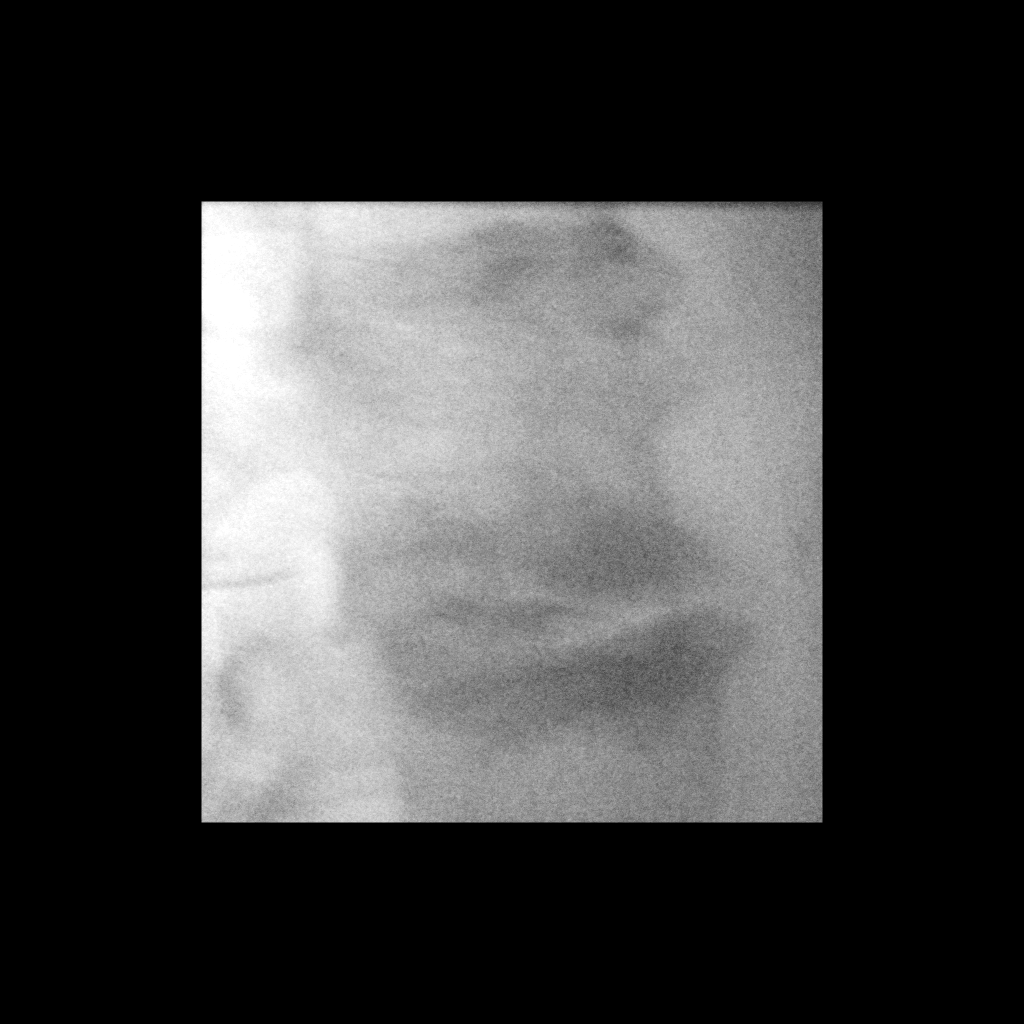

[Series 2: ortho standard · 1 of 1 slices shown (2 of 2)]
[im 1/1]
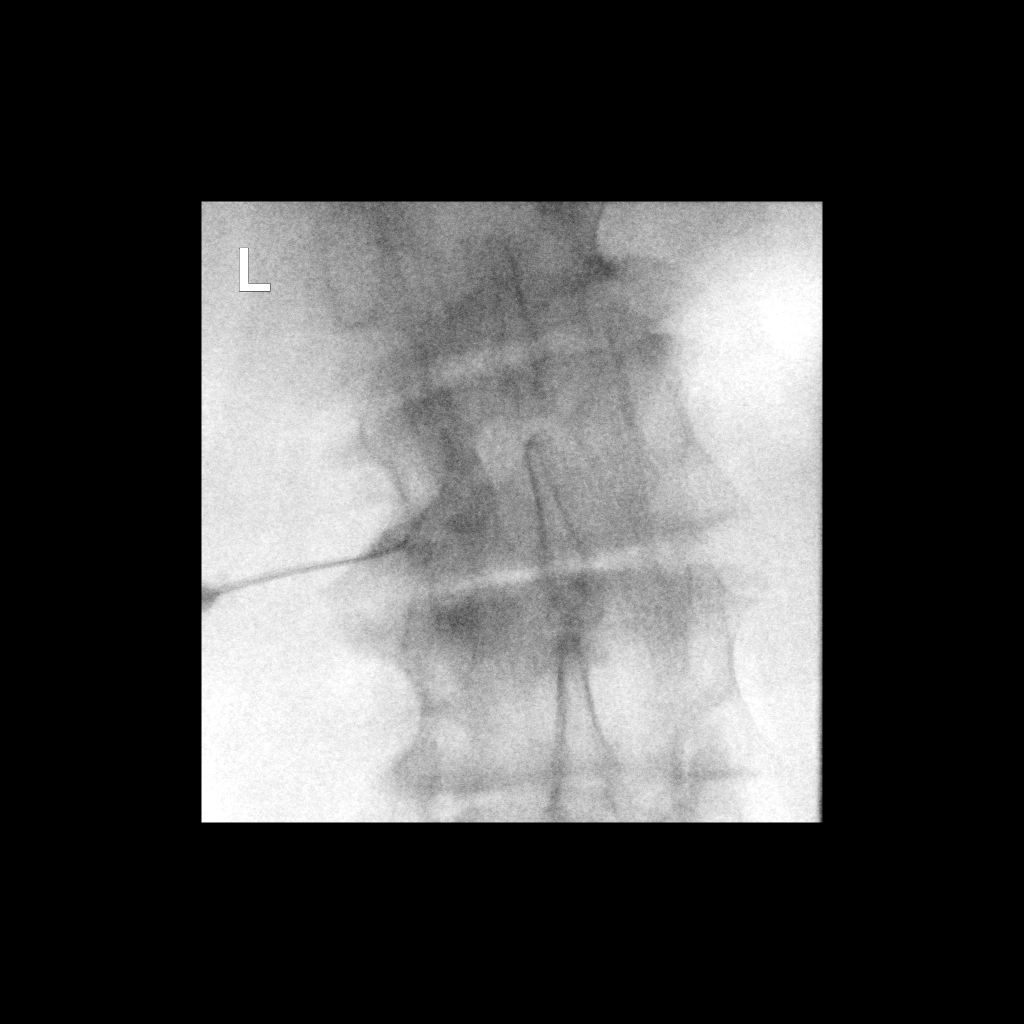

[2 of 2 positions shown; findings below may reference images not displayed]

EXAM:
DG INJECT/SIG INC NEEDLE/MENDE/HOCES EPI/ALEWIJNSE/SAC W/IMG

FLUOROSCOPY TIME:  Fluoroscopy Time: 21 seconds

Radiation Exposure Index: 39.81 microGray*m^2

PROCEDURE:
The procedure, risks, benefits, and alternatives were explained to
the patient. Questions regarding the procedure were encouraged and
answered. The patient understands and consents to the procedure.

LEFT L2 NERVE ROOT BLOCK AND TRANSFORAMINAL EPIDURAL: A posterior
oblique approach was taken to the intervertebral foramen on the left
at L2-3 using a curved 3.5 inch 22 gauge spinal needle. Injection of
Isovue-M 200 outlined the left L2 nerve root and showed good
epidural spread. No vascular opacification is seen. 120 mg of
Depo-Medrol mixed with 2 mL of 1% lidocaine were instilled. The
injection resulted in concordant pain. The procedure was
well-tolerated, and the patient was discharged thirty minutes
following the injection in good condition. The patient's pain was
improved at the time of discharge.

COMPLICATIONS:
None
IMPRESSION: Technically successful injection consisting of a left L2 nerve root
block and transforaminal epidural.

## 2021-02-24 ENCOUNTER — Ambulatory Visit: Payer: Medicare Other | Admitting: Sports Medicine

## 2021-03-16 ENCOUNTER — Other Ambulatory Visit: Payer: Self-pay | Admitting: Student

## 2021-03-16 DIAGNOSIS — R002 Palpitations: Secondary | ICD-10-CM

## 2021-03-24 ENCOUNTER — Ambulatory Visit: Payer: Medicare Other | Admitting: Cardiovascular Disease

## 2021-03-24 ENCOUNTER — Other Ambulatory Visit: Payer: Self-pay

## 2021-03-24 ENCOUNTER — Encounter: Payer: Self-pay | Admitting: Cardiovascular Disease

## 2021-03-24 DIAGNOSIS — I2510 Atherosclerotic heart disease of native coronary artery without angina pectoris: Secondary | ICD-10-CM

## 2021-03-24 DIAGNOSIS — I82432 Acute embolism and thrombosis of left popliteal vein: Secondary | ICD-10-CM

## 2021-03-24 DIAGNOSIS — E782 Mixed hyperlipidemia: Secondary | ICD-10-CM | POA: Diagnosis not present

## 2021-03-24 DIAGNOSIS — I119 Hypertensive heart disease without heart failure: Secondary | ICD-10-CM

## 2021-03-24 DIAGNOSIS — R002 Palpitations: Secondary | ICD-10-CM | POA: Insufficient documentation

## 2021-03-24 MED ORDER — RIVAROXABAN 20 MG PO TABS: 20.0000 mg | ORAL_TABLET | Freq: Every day | ORAL | 3 refills | Status: DC

## 2021-03-24 MED ORDER — METOPROLOL TARTRATE 25 MG PO TABS: 25.0000 mg | ORAL_TABLET | Freq: Every day | ORAL | 3 refills | Status: DC

## 2021-03-24 NOTE — Assessment & Plan Note (Signed)
History of hyperlipidemia on Repatha followed by his PCP.

## 2021-03-24 NOTE — Assessment & Plan Note (Signed)
History of essential hypertension a blood pressure measured today at 126/92.  He says systolic blood pressure is slightly increased compared to what it usually runs at home.  He is on metoprolol.

## 2021-03-24 NOTE — Assessment & Plan Note (Signed)
History of CAD status post rotational atherectomy, PCI drug-eluting stenting x2 of the proximal LAD by Dr. Ellouise Newer 11/03/2010.  I restudied him 08/31/2015 revealing a widely patent stent with no other significant CAD and normal LV function.  He denies chest pain or shortness of breath.

## 2021-03-24 NOTE — Patient Instructions (Signed)

## 2021-03-24 NOTE — Assessment & Plan Note (Signed)
History of palpitations found to be short runs of nonsustained ventricular tachycardia and atrial tachycardia markedly improved on oral beta-blockade.

## 2021-03-24 NOTE — Assessment & Plan Note (Signed)
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

## 2021-03-24 NOTE — Progress Notes (Signed)
03/24/2021 Nathan Rodriguez   06-08-1945  [id number]  Primary Physician Nathan Axe, MD Primary Cardiologist: Nathan Harp MD Nathan Rodriguez, [place]  HPI:  Nathan Rodriguez is a 76 y.o.    thin and fit-appearing married Caucasian male father of one daughter who is a retired Presenter, broadcasting. He was referred by Nathan Rodriguez [facility] for cardiovascular evaluation because of progressive dyspnea .  I last saw him in the office 01/28/2020.Marland Kitchen  He has a history of CAD status post rotational atherectomy, PCI and drug eluting stenting 2 by Dr. Ellouise Rodriguez 11/03/2010 of his LAD. He is a Lawyer as noted progressive increasing dyspnea on exertion and decreased exercise tolerance over the last several weeks. This resected include hyperlipidemia not on statin therapy, history of tobacco abuse smoking 5-6 cigarettes a day off and on for the last 40 years.There is a family history of a father who had bypass surgery at age 81. I performed left heart cath on him 08/31/2015 revealing a widely patent stent, no significant CAD with normal LV function.  Right after that he was diagnosed with a DVT and PE was placed on Xarelto.  His symptoms of dyspnea resolved.  He is being sent back now by Nathan Cash PA-C because of elevated heart rate with exercise and some dyspnea.   I obtained a 2D echocardiogram on him 03/23/2018 which was essentially normal and a Myoview stress test that showed diaphragmatic attenuation.  He did see Dr. Curt Rodriguez in the office for tachycardia and an event monitor showed short runs of nonsustained ventricular tachycardia and atrial tachycardia.  He was prescribed metoprolol twice daily.  His tachycardic episodes have markedly improved.  He is no longer short of breath and is able to exercise without limitation.  He denies chest pain.  Our pharmacist Nathan Rodriguez is also working with him to initiate [place] therapy for his hyperlipidemia and statin intolerance.   Since I saw him in the office a year ago  he continues to do well.  He is very active and rides an indoor bike.  He denies chest pain or shortness of breath.   Current Meds  Medication Sig   aluminum hydroxide-magnesium carbonate (GAVISCON) 95-358 MG/15ML SUSP Take 15 mLs by mouth as needed for indigestion or heartburn.   Coenzyme Q10 (CO Q 10 PO) Take 1 tablet by mouth daily. With turmeric curcumin   Evolocumab (REPATHA SURECLICK) 062 MG/ML SOAJ INJECT 140 MG INTO THE SKIN EVERY 14 DAYS (Patient taking differently: Inject 140 mg as directed every 14 (fourteen) days.)   hydrocortisone valerate cream (WESTCORT) 0.2 % Apply 1 application topically 2 (two) times daily as needed (irritation).   Magnesium 250 MG TABS Take 250 mg by mouth daily.   Melatonin 10 MG CAPS Take 10 mg by mouth at bedtime.   metoprolol tartrate (LOPRESSOR) 25 MG tablet Take 1 tablet (25 mg total) by mouth daily. Please make overdue appt with Dr. Curt Rodriguez before anymore refills. Thank you 2nd attempt   Multiple Vitamin (MULTIVITAMIN WITH MINERALS) TABS tablet Take 1 tablet by mouth daily.   Omega-3 1000 MG CAPS Take 1,000 mg by mouth daily.   omeprazole (PRILOSEC) 20 MG capsule Take 20 mg by mouth daily.   OVER THE COUNTER MEDICATION Take 1 Dose by mouth daily. Super beets   OVER THE COUNTER MEDICATION Take 3 capsules by mouth daily. Balance of Nature Fruit   OVER THE COUNTER MEDICATION Take 3 capsules by mouth daily. Balance of Petra Kuba  vegetables   rivaroxaban (XARELTO) 20 MG TABS tablet Take 20 mg by mouth daily with supper.     No Known Allergies  Social History   Socioeconomic History   Marital status: Married    Spouse name: Not on file   Number of children: Not on file   Years of education: Not on file   Highest education level: Not on file  Occupational History   Not on file  Tobacco Use   Smoking status: Former    Packs/day: 0.25    Years: 43.00    Pack years: 10.75    Types: Cigarettes    Quit date: 03/21/2002    Years since quitting: 19.0    Smokeless tobacco: Never  Vaping Use   Vaping Use: Never used  Substance and Sexual Activity   Alcohol use: Yes    Comment: rare   Drug use: No   Sexual activity: Not on file  Other Topics Concern   Not on file  Social History Narrative   Not on file   Social Determinants of Health   Financial Resource Strain: Not on file  Food Insecurity: Not on file  Transportation Needs: Not on file  Physical Activity: Not on file  Stress: Not on file  Social Connections: Not on file  Intimate Partner Violence: Not on file     Review of Systems: General: negative for chills, fever, night sweats or weight changes.  Cardiovascular: negative for chest pain, dyspnea on exertion, edema, orthopnea, palpitations, paroxysmal nocturnal dyspnea or shortness of breath Dermatological: negative for rash Respiratory: negative for cough or wheezing Urologic: negative for hematuria Abdominal: negative for nausea, vomiting, diarrhea, bright red blood per rectum, melena, or hematemesis Neurologic: negative for visual changes, syncope, or dizziness All other systems reviewed and are otherwise negative except as noted above.    Blood pressure (!) 126/92, pulse 73, height 5' 10.5" (1.791 m), weight 166 lb (75.3 kg), SpO2 97 %.  General appearance: alert and no distress Neck: no adenopathy, no carotid bruit, no JVD, supple, symmetrical, trachea midline, and thyroid not enlarged, symmetric, no tenderness/mass/nodules Lungs: clear to auscultation bilaterally Heart: regular rate and rhythm, S1, S2 normal, no murmur, click, rub or gallop Extremities: extremities normal, atraumatic, no cyanosis or edema Pulses: 2+ and symmetric Skin: Skin color, texture, turgor normal. No rashes or lesions Neurologic: Grossly normal  EKG sinus rhythm at 73 with right bundle branch block.  I personally reviewed this EKG.  ASSESSMENT AND PLAN:   Coronary artery disease involving native coronary artery of native heart without  angina pectoris History of CAD status post rotational atherectomy, PCI drug-eluting stenting x2 of the proximal LAD by Dr. Ellouise Rodriguez 11/03/2010.  I restudied him 08/31/2015 revealing a widely patent stent with no other significant CAD and normal LV function.  He denies chest pain or shortness of breath.  Hyperlipidemia History of hyperlipidemia on Repatha followed by his PCP.  Hypertensive heart disease History of essential hypertension a blood pressure measured today at 126/92.  He says systolic blood pressure is slightly increased compared to what it usually runs at home.  He is on metoprolol.  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  Palpitations History of palpitations found to be short runs of nonsustained ventricular tachycardia and atrial tachycardia markedly improved on oral beta-blockade.     Nathan Harp MD FACP,FACC,FAHA, [facility] 03/24/2021 1:51 PM

## 2021-03-25 ENCOUNTER — Ambulatory Visit: Payer: Medicare Other | Admitting: Student

## 2021-04-05 ENCOUNTER — Ambulatory Visit: Payer: Medicare Other | Admitting: Sports Medicine

## 2021-04-05 ENCOUNTER — Other Ambulatory Visit: Payer: Self-pay

## 2021-04-05 VITALS — BP 122/82 | HR 62 | Ht 70.0 in | Wt 169.0 lb

## 2021-04-05 DIAGNOSIS — M1711 Unilateral primary osteoarthritis, right knee: Secondary | ICD-10-CM | POA: Diagnosis not present

## 2021-04-05 DIAGNOSIS — G8929 Other chronic pain: Secondary | ICD-10-CM

## 2021-04-05 DIAGNOSIS — M1712 Unilateral primary osteoarthritis, left knee: Secondary | ICD-10-CM

## 2021-04-05 DIAGNOSIS — M25562 Pain in left knee: Secondary | ICD-10-CM | POA: Diagnosis not present

## 2021-04-05 DIAGNOSIS — M25561 Pain in right knee: Secondary | ICD-10-CM

## 2021-04-05 NOTE — Progress Notes (Signed)
Nathan Rodriguez [place] Phone: [phone]   Assessment and Plan:     1. Chronic pain of both knees 2. Primary osteoarthritis of left knee 3. Primary osteoarthritis of right knee -Chronic with exacerbation, subsequent visit - Likely acute flares of chronic osteoarthritis based on HPI, physical exam - Patient received only about 1 weeks relief from CSI, and received only about 1 months relief from HA injection.  He has been doing therapy for bilateral knees guided by his personal trainer for the past 3+ weeks without significant improvement - Discussed further treatment options with patient including additional CSI, HA injections, trialing PRP injections, continued physical therapy.  Patient does not want to try PRP injections at this time, and has failed other conservative therapies, so he says that he wishes to talk with an orthopedic surgeon about possible total knee replacement.  Patient has been seeing orthopedic surgeon, and states that he will call and set up an appointment.  I discussed with patient that we could get updated x-rays and MRIs, however patient elects to wait until seeing orthopedic surgeon to see what additional imaging studies they would like to have   Pertinent previous records reviewed include none   Follow Up: As needed   Subjective:   I, Nathan Rodriguez, am serving as a Education administrator for Nathan Rodriguez  Chief Complaint: Bilateral knee pain   HPI:  12/24/20 Patient is a 76 year old male presenting with knee pain. Patient states that he has bilateral knee pain and talked to [name] about the pain and they stated he would need a knee replacement in the Left and an arthroscopy in the right. Patient is here for a second opinion as he would like to avoid that as much as possible. Patient states that walking causes him pain now as well as his normal weighted squats and lunges workout.  Patient states that he received steroid injections that did not seem to help. Since then patients knees have gotten worse.    01/27/21 Patient states that his knee pain is worse than what it was at the last visit. States it is getting painful to walk. Patient states the injection seemed to help a little but the relief did not last long.   04/05/2021 Patient states the knee aren't doing well, hoping for one last trick before he has to resort to surgery    Relevant Historical Information: CAD, OA bilateral knees, right shoulder replacement, history of DVT   Additional pertinent review of systems negative.   Current Outpatient Medications:    acetaminophen (TYLENOL) 325 MG tablet, Take 650-975 mg by mouth every 6 (six) hours as needed for moderate pain or headache., Disp: , Rfl:    aluminum hydroxide-magnesium carbonate (GAVISCON) 95-358 MG/15ML SUSP, Take 15 mLs by mouth as needed for indigestion or heartburn., Disp: , Rfl:    Coenzyme Q10 (CO Q 10 PO), Take 1 tablet by mouth daily. With turmeric curcumin, Disp: , Rfl:    Evolocumab (REPATHA SURECLICK) 540 MG/ML SOAJ, INJECT 140 MG INTO THE SKIN EVERY 14 DAYS (Patient taking differently: Inject 140 mg as directed every 14 (fourteen) days.), Disp: 2 mL, Rfl: 11   hydrocortisone valerate cream (WESTCORT) 0.2 %, Apply 1 application topically 2 (two) times daily as needed (irritation)., Disp: , Rfl:    Magnesium 250 MG TABS, Take 250 mg by mouth daily., Disp: , Rfl:    Melatonin 10 MG CAPS, Take 10  mg by mouth at bedtime., Disp: , Rfl:    metoprolol tartrate (LOPRESSOR) 25 MG tablet, Take 1 tablet (25 mg total) by mouth daily., Disp: 90 tablet, Rfl: 3   Multiple Vitamin (MULTIVITAMIN WITH MINERALS) TABS tablet, Take 1 tablet by mouth daily., Disp: , Rfl:    Omega-3 1000 MG CAPS, Take 1,000 mg by mouth daily., Disp: , Rfl:    omeprazole (PRILOSEC) 20 MG capsule, Take 20 mg by mouth daily., Disp: , Rfl:    OVER THE COUNTER MEDICATION, Take 1 Dose by  mouth daily. Super beets, Disp: , Rfl:    OVER THE COUNTER MEDICATION, Take 3 capsules by mouth daily. Balance of Nature Fruit, Disp: , Rfl:    OVER THE COUNTER MEDICATION, Take 3 capsules by mouth daily. Balance of Nature vegetables, Disp: , Rfl:    oxyCODONE-acetaminophen (PERCOCET) 5-325 MG tablet, Take 1 tablet by mouth every 6 (six) hours as needed for severe pain., Disp: 28 tablet, Rfl: 0   rivaroxaban (XARELTO) 20 MG TABS tablet, Take 1 tablet (20 mg total) by mouth daily with supper., Disp: 90 tablet, Rfl: 3   tiZANidine (ZANAFLEX) 2 MG tablet, Take 1 tablet (2 mg total) by mouth every 8 (eight) hours as needed for muscle spasms., Disp: 20 tablet, Rfl: 0   Wheat Dextrin (BENEFIBER DRINK MIX PO), Take 1 packet by mouth daily as needed (constipation)., Disp: , Rfl:    Objective:     Vitals:   04/05/21 1327  BP: 122/82  Pulse: 62  SpO2: 97%  Weight: 169 lb (76.7 kg)  Height: 5\' 10"  (1.778 m)      Body mass index is 24.25 kg/m.    Physical Exam:      General:  awake, alert oriented, no acute distress nontoxic Skin: no suspicious lesions or rashes Neuro:sensation intact, no deficits, strength 5/5 with no deficits, no atrophy, normal muscle tone Psych: No signs of anxiety, depression or other mood disorder   Bilateral knee: Bilateral crepitus No swelling No deformity Neg fluid wave, joint milking ROM Flex 90, Ext 15 NTTP over the quad tendon, medial fem condyle, lat fem condyle, patella, plica, patella tendon, tibial tuberostiy, fibular head, posterior fossa, pes anserine bursa, gerdy's tubercle, medial jt line, lateral jt line Neg anterior and posterior drawer Neg lachman Neg sag sign Negative varus stress Negative valgus stress + McMurray bilaterally Positive Thessaly on right, negative on left   Gait normal    Electronically signed by:  Nathan Rodriguez [facility] 2:07 PM 04/05/21

## 2021-04-05 NOTE — Patient Instructions (Addendum)
Good to see you  Follow up as needed  

## 2021-05-06 ENCOUNTER — Other Ambulatory Visit: Payer: Self-pay | Admitting: Orthopedic Surgery

## 2021-05-06 NOTE — Progress Notes (Signed)
Surgery orders requested via Epic inbox. °

## 2021-05-07 NOTE — Patient Instructions (Addendum)
DUE TO COVID-19 ONLY ONE VISITOR IS ALLOWED TO COME WITH YOU AND STAY IN THE WAITING ROOM ONLY DURING PRE OP AND PROCEDURE DAY OF SURGERY.   Up to two visitors ages 16+ are allowed at one time in a patient's room.  The visitors may rotate out with other people throughout the day.  Additionally, up to two children between the ages of 44 and 75 are allowed and do not count toward the number of allowed visitors.  Children within this age range must be accompanied by an adult visitor.  One adult visitor may remain with the patient overnight and must be in the room by 8 PM.  YOU NEED TO HAVE A COVID 19 TEST ON_3-1-23 @  ______ THIS TEST MUST BE DONE BEFORE SURGERY,     COVID TESTING [place] COME IN THROUGH MAIN ENTRANCE BE SEATED INT THE LOBBY AREA TO THE RIGHT AS YOU COME IN THE MAIN ENTRANCE DIAL [phone] GIVE THEM YOUR NAME AND LET THEM KNOW YOU ARE HERE FOR COVID TESTING    ONCE YOUR COVID TEST IS COMPLETED,  PLEASE Wear a mask when in [place]           Your procedure is scheduled on: 05-21-21   Report to [facility] Main  Entrance   Report to admitting at      [place]  AM     Call this number if you have problems the morning of surgery [phone]   Remember: NO SOLID FOOD AFTER MIDNIGHT THE NIGHT PRIOR TO SURGERY. NOTHING BY MOUTH EXCEPT CLEAR LIQUIDS UNTIL     0700 am . PLEASE FINISH G2  DRINK PER SURGEON ORDER  WHICH NEEDS TO BE COMPLETED AT        0700 am then nothing by mouth.     CLEAR LIQUID DIET                                                                    water Black Coffee and tea, regular and decaf No Creamer                            Plain Jell-O any favor except red or purple                                  Fruit ices (not with fruit pulp)                                      Iced Popsicles                                     Carbonated beverages, regular and diet                                     Cranberry, grape and apple juices Sports drinks like Gatorade Lightly  seasoned clear broth or consume(fat free) Sugar, honey syrup     BRUSH YOUR TEETH MORNING OF SURGERY AND RINSE YOUR MOUTH OUT, NO CHEWING GUM CANDY OR MINTS.     Take these medicines the morning of surgery with A SIP OF WATER: omeprazole, metoprolol, tylenol if needed   DO NOT TAKE ANY DIABETIC MEDICATIONS DAY OF YOUR SURGERY                               You may not have any metal on your body including hair pins and              piercings  Do not wear jewelry, lotions, powders,perfumes,   or     deodorant                        Men may shave face and neck.   Do not bring valuables to the hospital. [name].  Contacts, dentures or bridgework may not be worn into surgery.  You may bring a small overnight bag with you     Patients discharged the day of surgery will not be allowed to drive home. IF YOU ARE HAVING SURGERY AND GOING HOME THE SAME DAY, YOU MUST HAVE AN ADULT TO DRIVE YOU HOME AND BE WITH YOU FOR 24 HOURS. YOU MAY GO HOME BY TAXI OR UBER OR ORTHERWISE, BUT AN ADULT MUST ACCOMPANY YOU HOME AND STAY WITH YOU FOR 24 HOURS.  Name and phone number of your driver:  Special Instructions: N/A              Please read over the following fact sheets you were given: _____________________________________________________________________             [facility] - Preparing for Surgery Before surgery, you can play an important role.  Because skin is not sterile, your skin needs to be as free of germs as possible.  You can reduce the number of germs on your skin by washing with CHG (chlorahexidine gluconate) soap before surgery.  CHG is an antiseptic cleaner which kills germs and bonds with the skin to continue killing germs even after washing. Please DO NOT use if you have an allergy to CHG or antibacterial soaps.  If your skin becomes reddened/irritated stop  using the CHG and inform your nurse when you arrive at Short Stay. Do not shave (including legs and underarms) for at least 48 hours prior to the first CHG shower.  You may shave your face/neck. Please follow these instructions carefully:  1.  Shower with CHG Soap the night before surgery and the  morning of Surgery.  2.  If you choose to wash your hair, wash your hair first as usual with your  normal  shampoo.  3.  After you shampoo, rinse your hair and body thoroughly to remove the  shampoo.                           4.  Use CHG as you would any other liquid soap.  You can apply chg directly  to the skin and wash                       Gently with a scrungie or clean  washcloth.  5.  Apply the CHG Soap to your body ONLY FROM THE NECK DOWN.   Do not use on face/ open                           Wound or open sores. Avoid contact with eyes, ears mouth and genitals (private parts).                       Wash face,  Genitals (private parts) with your normal soap.             6.  Wash thoroughly, paying special attention to the area where your surgery  will be performed.  7.  Thoroughly rinse your body with warm water from the neck down.  8.  DO NOT shower/wash with your normal soap after using and rinsing off  the CHG Soap.                9.  Pat yourself dry with a clean towel.            10.  Wear clean pajamas.            11.  Place clean sheets on your bed the night of your first shower and do not  sleep with pets. Day of Surgery : Do not apply any lotions/deodorants the morning of surgery.  Please wear clean clothes to the hospital/surgery center.  FAILURE TO FOLLOW THESE INSTRUCTIONS MAY RESULT IN THE CANCELLATION OF YOUR SURGERY PATIENT SIGNATURE_________________________________  NURSE SIGNATURE__________________________________  ________________________________________________________________________    Nathan Rodriguez  An incentive spirometer is a tool that can help keep your  lungs clear and active. This tool measures how well you are filling your lungs with each breath. Taking long deep breaths may help reverse or decrease the chance of developing breathing (pulmonary) problems (especially infection) following: A long period of time when you are unable to move or be active. BEFORE THE PROCEDURE  If the spirometer includes an indicator to show your best effort, your nurse or respiratory therapist will set it to a desired goal. If possible, sit up straight or lean slightly forward. Try not to slouch. Hold the incentive spirometer in an upright position. INSTRUCTIONS FOR USE  Sit on the edge of your bed if possible, or sit up as far as you can in bed or on a chair. Hold the incentive spirometer in an upright position. Breathe out normally. Place the mouthpiece in your mouth and seal your lips tightly around it. Breathe in slowly and as deeply as possible, raising the piston or the ball toward the top of the column. Hold your breath for 3-5 seconds or for as long as possible. Allow the piston or ball to fall to the bottom of the column. Remove the mouthpiece from your mouth and breathe out normally. Rest for a few seconds and repeat Steps 1 through 7 at least 10 times every 1-2 hours when you are awake. Take your time and take a few normal breaths between deep breaths. The spirometer may include an indicator to show your best effort. Use the indicator as a goal to work toward during each repetition. After each set of 10 deep breaths, practice coughing to be sure your lungs are clear. If you have an incision (the cut made at the time of surgery), support your incision when coughing by placing a pillow or rolled up towels firmly against it. Once you are  able to get out of bed, walk around indoors and cough well. You may stop using the incentive spirometer when instructed by your caregiver.  RISKS AND COMPLICATIONS Take your time so you do not get dizzy or light-headed. If  you are in pain, you may need to take or ask for pain medication before doing incentive spirometry. It is harder to take a deep breath if you are having pain. AFTER USE Rest and breathe slowly and easily. It can be helpful to keep track of a log of your progress. Your caregiver can provide you with a simple table to help with this. If you are using the spirometer at home, follow these instructions: [place] IF:  You are having difficultly using the spirometer. You have trouble using the spirometer as often as instructed. Your pain medication is not giving enough relief while using the spirometer. You develop fever of 100.5 F (38.1 C) or higher. SEEK IMMEDIATE MEDICAL CARE IF:  You cough up bloody sputum that had not been present before. You develop fever of 102 F (38.9 C) or greater. You develop worsening pain at or near the incision site. MAKE SURE YOU:  Understand these instructions. Will watch your condition. Will get help right away if you are not doing well or get worse. Document Released: 07/18/2006 Document Revised: 05/30/2011 Document Reviewed: 09/18/2006 ExitCare Patient Information 2014 ExitCare, [place].   ________________________________________________________________________  WHAT IS A BLOOD TRANSFUSION? Blood Transfusion Information  A transfusion is the replacement of blood or some of its parts. Blood is made up of multiple cells which provide different functions. Red blood cells carry oxygen and are used for blood loss replacement. White blood cells fight against infection. Platelets control bleeding. Plasma helps clot blood. Other blood products are available for specialized needs, such as hemophilia or other clotting disorders. BEFORE THE TRANSFUSION  Who gives blood for transfusions?  Healthy volunteers who are fully evaluated to make sure their blood is safe. This is blood bank blood. Transfusion therapy is the safest it has ever been in the practice  of medicine. Before blood is taken from a donor, a complete history is taken to make sure that person has no history of diseases nor engages in risky social behavior (examples are intravenous drug use or sexual activity with multiple partners). The donor's travel history is screened to minimize risk of transmitting infections, such as malaria. The donated blood is tested for signs of infectious diseases, such as HIV and hepatitis. The blood is then tested to be sure it is compatible with you in order to minimize the chance of a transfusion reaction. If you or a relative donates blood, this is often done in anticipation of surgery and is not appropriate for emergency situations. It takes many days to process the donated blood. RISKS AND COMPLICATIONS Although transfusion therapy is very safe and saves many lives, the main dangers of transfusion include:  Getting an infectious disease. Developing a transfusion reaction. This is an allergic reaction to something in the blood you were given. Every precaution is taken to prevent this. The decision to have a blood transfusion has been considered carefully by your caregiver before blood is given. Blood is not given unless the benefits outweigh the risks. AFTER THE TRANSFUSION Right after receiving a blood transfusion, you will usually feel much better and more energetic. This is especially true if your red blood cells have gotten low (anemic). The transfusion raises the level of the red blood cells which carry oxygen,  and this usually causes an energy increase. The nurse administering the transfusion will monitor you carefully for complications. HOME CARE INSTRUCTIONS  No special instructions are needed after a transfusion. You may find your energy is better. Speak with your caregiver about any limitations on activity for underlying diseases you may have. SEEK MEDICAL CARE IF:  Your condition is not improving after your transfusion. You develop redness or  irritation at the intravenous (IV) site. SEEK IMMEDIATE MEDICAL CARE IF:  Any of the following symptoms occur over the next 12 hours: Shaking chills. You have a temperature by mouth above 102 F (38.9 C), not controlled by medicine. Chest, back, or muscle pain. People around you feel you are not acting correctly or are confused. Shortness of breath or difficulty breathing. Dizziness and fainting. You get a rash or develop hives. You have a decrease in urine output. Your urine turns a dark color or changes to pink, red, or brown. Any of the following symptoms occur over the next 10 days: You have a temperature by mouth above 102 F (38.9 C), not controlled by medicine. Shortness of breath. Weakness after normal activity. The white part of the eye turns yellow (jaundice). You have a decrease in the amount of urine or are urinating less often. Your urine turns a dark color or changes to pink, red, or brown. Document Released: 03/04/2000 Document Revised: 05/30/2011 Document Reviewed: 10/22/2007 [facility] Patient Information 2014 [place], [place].  _______________________________________________________________________

## 2021-05-07 NOTE — Progress Notes (Addendum)
PCP - [name], MD Cardiologist - Lorie Phenix LOV1-4-23 epic  clearance 05-21-21 epic  PPM/ICD -  Device Orders -  Rep Notified -   Chest x-ray - 10-02-20 epic EKG - 03-24-21 epic Stress Test - 2019 epic ECHO - 2020 epic Cardiac Cath -   Sleep Study -  CPAP - NO could not tolerate  Fasting Blood Sugar - 100-130 Checks Blood Sugar ____ times a day  Blood Thinner Instructions: Xarelto prescribed by Dr. Gwenlyn Found  Hold 3 days prior Aspirin Instructions:  ERAS Protcol - PRE-SURGERY G2-   COVID TEST- 05-19-21 COVID vaccine -  Fully vaccinated and boosted Moderna  Activity--Rides indoor bike with no SOB 50-60 miles a week limited due to knee still walks 3 miles a day with Dog  Anesthesia review: CAD stents, HTN, pre Dm, PE/DVT, Factor V  , OSA no Cpap  Patient denies shortness of breath, fever, cough and chest pain at PAT appointment   All instructions explained to the patient, with a verbal understanding of the material. Patient agrees to go over the instructions while at home for a better understanding. Patient also instructed to self quarantine after being tested for COVID-19. The opportunity to ask questions was provided.

## 2021-05-10 ENCOUNTER — Other Ambulatory Visit: Payer: Self-pay

## 2021-05-10 ENCOUNTER — Encounter (HOSPITAL_COMMUNITY)
Admission: RE | Admit: 2021-05-10 | Discharge: 2021-05-10 | Disposition: A | Discharge status: Home / Self Care | Payer: Medicare Other | Source: Ambulatory Visit | Attending: Orthopedic Surgery | Admitting: Orthopedic Surgery

## 2021-05-10 ENCOUNTER — Encounter (HOSPITAL_COMMUNITY): Payer: Self-pay

## 2021-05-10 VITALS — BP 132/91 | HR 86 | Temp 98.2°F | Resp 16 | Ht 70.5 in | Wt 165.0 lb

## 2021-05-10 DIAGNOSIS — K219 Gastro-esophageal reflux disease without esophagitis: Secondary | ICD-10-CM | POA: Diagnosis not present

## 2021-05-10 DIAGNOSIS — R7303 Prediabetes: Secondary | ICD-10-CM | POA: Diagnosis not present

## 2021-05-10 DIAGNOSIS — Z01811 Encounter for preprocedural respiratory examination: Secondary | ICD-10-CM

## 2021-05-10 DIAGNOSIS — D682 Hereditary deficiency of other clotting factors: Secondary | ICD-10-CM | POA: Diagnosis not present

## 2021-05-10 DIAGNOSIS — G4730 Sleep apnea, unspecified: Secondary | ICD-10-CM | POA: Diagnosis not present

## 2021-05-10 DIAGNOSIS — M1711 Unilateral primary osteoarthritis, right knee: Secondary | ICD-10-CM | POA: Insufficient documentation

## 2021-05-10 DIAGNOSIS — I4891 Unspecified atrial fibrillation: Secondary | ICD-10-CM | POA: Diagnosis not present

## 2021-05-10 DIAGNOSIS — I2510 Atherosclerotic heart disease of native coronary artery without angina pectoris: Secondary | ICD-10-CM | POA: Insufficient documentation

## 2021-05-10 DIAGNOSIS — Z87891 Personal history of nicotine dependence: Secondary | ICD-10-CM | POA: Insufficient documentation

## 2021-05-10 DIAGNOSIS — I10 Essential (primary) hypertension: Secondary | ICD-10-CM

## 2021-05-10 DIAGNOSIS — Z01818 Encounter for other preprocedural examination: Secondary | ICD-10-CM

## 2021-05-10 HISTORY — DX: Cardiac arrhythmia, unspecified: I49.9

## 2021-05-10 HISTORY — DX: Myoneural disorder, unspecified: G70.9

## 2021-05-10 HISTORY — DX: Depression, unspecified: F32.A

## 2021-05-10 HISTORY — DX: Acute embolism and thrombosis of unspecified deep veins of unspecified lower extremity: I82.409

## 2021-05-10 LAB — CBC
HCT: 50.6 % (ref 39.0–52.0)
Hemoglobin: 16.3 g/dL (ref 13.0–17.0)
MCH: 31.6 pg (ref 26.0–34.0)
MCHC: 32.2 g/dL (ref 30.0–36.0)
MCV: 98.1 fL (ref 80.0–100.0)
Platelets: 272 10*3/uL (ref 150–400)
RBC: 5.16 MIL/uL (ref 4.22–5.81)
RDW: 12.8 % (ref 11.5–15.5)
WBC: 4.7 10*3/uL (ref 4.0–10.5)
nRBC: 0 % (ref 0.0–0.2)

## 2021-05-10 LAB — GLUCOSE, CAPILLARY: Glucose-Capillary: 114 mg/dL — ABNORMAL HIGH (ref 70–99)

## 2021-05-10 LAB — BASIC METABOLIC PANEL
Anion gap: 5 (ref 5–15)
BUN: 24 mg/dL — ABNORMAL HIGH (ref 8–23)
CO2: 27 mmol/L (ref 22–32)
Calcium: 9.7 mg/dL (ref 8.9–10.3)
Chloride: 107 mmol/L (ref 98–111)
Creatinine, Ser: 0.97 mg/dL (ref 0.61–1.24)
GFR, Estimated: >60 mL/min (ref >60)
Glucose, Bld: 122 mg/dL — ABNORMAL HIGH (ref 70–99)
Potassium: 5 mmol/L (ref 3.5–5.1)
Sodium: 139 mmol/L (ref 135–145)

## 2021-05-10 LAB — SURGICAL PCR SCREEN
MRSA, PCR: NEGATIVE
Staphylococcus aureus: NEGATIVE

## 2021-05-10 LAB — TYPE AND SCREEN
ABO/RH(D): O POS
Antibody Screen: NEGATIVE

## 2021-05-11 LAB — HEMOGLOBIN A1C
Hgb A1c MFr Bld: 5.8 % — ABNORMAL HIGH (ref 4.8–5.6)
Mean Plasma Glucose: 120 mg/dL

## 2021-05-12 ENCOUNTER — Telehealth: Payer: Self-pay | Admitting: Cardiovascular Disease

## 2021-05-12 NOTE — Telephone Encounter (Signed)
Patient returned call. Advised patient, per pre-op covering, his call will be returned shortly.

## 2021-05-12 NOTE — Telephone Encounter (Signed)
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    Procedure: Right total Knee Arthoplasty Date of procedure: 05/21/21  CrCl 67 ml/min  Patient will need to hold anticoagulation for 3 days prior since spinal anesthesia is being used. ACC/AHA and Chest guidelines do not recommend lovenox bridges with DOAC. He has only had 1 event. I think its reasonable that he could hold Xarelto 3 days prior and resume therapeutic dosing within 24 hr of procedure as long as patient knows there is a risk of clot. However, I will make sure that Dr. Gwenlyn Found agrees.

## 2021-05-12 NOTE — Telephone Encounter (Signed)
Spoke with patient. I reached out to patient for update on how he is doing. The patient affirms he has been doing well without any new cardiac symptoms. Exercises regularly in multiple modalities (walking 3 miles a day, indoor bike, shoulder exercises) without angina or dyspnea. Therefore, based on ACC/AHA guidelines, the patient would be at acceptable risk for the planned procedure without further cardiovascular testing. The patient was advised that if he develops new symptoms prior to surgery to contact our office to arrange for a follow-up visit, and he verbalized understanding.  He states he previously had tremendous issues with getting his PCP's office to clear him to come off Xarelto for his DVT/PE so he actually had previously requested Dr. Gwenlyn Found follow this so Dr. Gwenlyn Found is on the most recent prescription and addressed in Dr. Kennon Holter last note to continue. Pt reports hx of DVT/PE about 5 years ago, first an only event, states he was diagnosed with factor 5 Leiden at that time. I think it is reasonable for cardiology to provide clearance recommendation for Xarelto since Dr. Gwenlyn Found now prescribes it. Will route to pharm for input.

## 2021-05-12 NOTE — Telephone Encounter (Signed)
° °  Pre-operative Risk Assessment    Patient Name: Nathan Rodriguez  DOB: Aug 14, 1945 MRN: [id number]      Request for Surgical Clearance    Procedure:   Right total Knee Arthoplasty  Date of Surgery:  Clearance 05/21/21                                 Surgeon: Dr. Dorna Leitz Surgeon's Group or Practice Name:   [facility] Phone number:  [phone] Fax number:  [phone]   Type of Clearance Requested:   - Medical  - Pharmacy:  Hold Rivaroxaban (Xarelto)     Type of Anesthesia:  Spinal   Additional requests/questions:      [name]   05/12/2021, 2:31 PM

## 2021-05-12 NOTE — Telephone Encounter (Addendum)
° °  Patient Name: Nathan Rodriguez  DOB: 16-Feb-1946 MRN: [id number]  Primary Cardiologist: Quay Burow, MD  Chart reviewed as part of pre-operative protocol coverage.   - Per my discussion with patient, based on ACC/AHA guidelines, Nathan Rodriguez would be at acceptable risk for the planned procedure without further cardiovascular testing.   - Per verbal discussion with Dr. Gwenlyn Found, he feels patient is OK to hold Xarelto for 3 days without a bridge. Per pharmD and Dr. Gwenlyn Found, would resume therapeutic dosing within 24 hours as long as surgeon is OK with this. Dr. Gwenlyn Found has given permission to resume 2 days post-operatively if surgeon feels necessary.  I placed a call to the patient to notify him but got VM. LMTCB when office reopens. Callback team can relay this recommendation to him. Please make sure patient acknowledges understanding that whenever his blood thinner is held there is a risk of clot; recommendations above were made to minimize total time off anticoagulation.  Will route this bundled recommendation to requesting provider via Epic fax function. Please call with questions.   Charlie Pitter, PA-C 05/12/2021, 5:12 PM

## 2021-05-12 NOTE — Telephone Encounter (Signed)
° °  Name: Nathan Rodriguez  DOB: [birth year]  MRN: [id number]   Primary Cardiologist: Quay Burow, MD  Chart reviewed as part of pre-operative protocol coverage. Patient was contacted 05/12/2021 in reference to pre-operative risk assessment for pending surgery as outlined below.  Nathan Rodriguez was last seen on 03/24/21 by Dr. Gwenlyn Found and doing well, followed from cardiology standpoint for hx of CAD with prior intervention with remote stenting to LAD in 2012. Last cath 2017 without obstructive disease. Echo 03/2018 EF 50-55%, grade 1 Dd, mild MR. He is on Xarelto due to history of DVT/PE. In prior preoperative clearances, the cardiology team has recommended that surgeon reach out to primary care provider to obtain clearance to come off Xarelto since he is not on this for any cardiac conditions. RCRI 0.9% indicating low CV risk overall based on comorbidities. I reached out to the patient to discuss symptoms. LMTCB.    Charlie Pitter, PA-C 05/12/2021, 2:48 PM
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
## 2021-05-13 NOTE — Telephone Encounter (Signed)
I s/w the pt and he has been made aware if instructions for Xarelto ok to hold x 3 days prior to procedure. Pt aware that Dr. Gwenlyn Found has given the instructions to resume as soon as the surgeon feels it is safe. If need be Dr. Gwenlyn Found has given permission to resume 2 days post-op. Pt aware the surgeon will advise as to when safe to resume. Pt thanked me for the call back and the follow through. I assured the pt that I will fax notes to surgeon office today that we have spoke as well.

## 2021-05-13 NOTE — Anesthesia Preprocedure Evaluation (Addendum)
Anesthesia Evaluation  Patient identified by MRN, date of birth, ID band Patient awake    Reviewed: Allergy & Precautions, H&P , NPO status , Patient's Chart, lab work & pertinent test results  Airway Mallampati: II   Neck ROM: full    Dental   Pulmonary shortness of breath, sleep apnea , former smoker,    breath sounds clear to auscultation       Cardiovascular hypertension, + CAD and + Cardiac Stents  + dysrhythmias Atrial Fibrillation  Rhythm:regular Rate:Normal  Stents x2 to LAD    Neuro/Psych  Headaches, PSYCHIATRIC DISORDERS Anxiety Depression  Neuromuscular disease    GI/Hepatic GERD  ,  Endo/Other    Renal/GU      Musculoskeletal  (+) Arthritis ,   Abdominal   Peds  Hematology Factor V deficiency   Anesthesia Other Findings   Reproductive/Obstetrics                            Anesthesia Physical Anesthesia Plan  ASA: 3  Anesthesia Plan: Spinal   Post-op Pain Management: Regional block*   Induction: Intravenous  PONV Risk Score and Plan: 1 and Ondansetron, Propofol infusion and Treatment may vary due to age or medical condition  Airway Management Planned: Simple Face Mask  Additional Equipment:   Intra-op Plan:   Post-operative Plan:   Informed Consent: I have reviewed the patients History and Physical, chart, labs and discussed the procedure including the risks, benefits and alternatives for the proposed anesthesia with the patient or authorized representative who has indicated his/her understanding and acceptance.     Dental advisory given  Plan Discussed with: CRNA, Anesthesiologist and Surgeon  Anesthesia Plan Comments: (See PAT note 05/10/2021, Konrad Felix Ward, PA-C)       Anesthesia Quick Evaluation

## 2021-05-19 ENCOUNTER — Other Ambulatory Visit: Payer: Self-pay

## 2021-05-19 ENCOUNTER — Encounter (HOSPITAL_COMMUNITY)
Admission: RE | Admit: 2021-05-19 | Discharge: 2021-05-19 | Disposition: A | Discharge status: Home / Self Care | Payer: Medicare Other | Source: Ambulatory Visit | Attending: Orthopedic Surgery | Admitting: Orthopedic Surgery

## 2021-05-19 DIAGNOSIS — Z01812 Encounter for preprocedural laboratory examination: Secondary | ICD-10-CM | POA: Insufficient documentation

## 2021-05-19 DIAGNOSIS — Z01818 Encounter for other preprocedural examination: Secondary | ICD-10-CM

## 2021-05-19 DIAGNOSIS — Z20822 Contact with and (suspected) exposure to covid-19: Secondary | ICD-10-CM | POA: Insufficient documentation

## 2021-05-19 LAB — SARS CORONAVIRUS 2 (TAT 6-24 HRS): SARS Coronavirus 2: NEGATIVE

## 2021-05-21 ENCOUNTER — Other Ambulatory Visit: Payer: Self-pay

## 2021-05-21 ENCOUNTER — Observation Stay (HOSPITAL_COMMUNITY)
Admission: RE | Admit: 2021-05-21 | Discharge: 2021-05-22 | Disposition: A | Discharge status: Home Health | Payer: Medicare Other | Source: Ambulatory Visit | Attending: Orthopedic Surgery | Admitting: Orthopedic Surgery

## 2021-05-21 ENCOUNTER — Ambulatory Visit (HOSPITAL_BASED_OUTPATIENT_CLINIC_OR_DEPARTMENT_OTHER): Payer: Medicare Other | Admitting: Certified Registered Nurse Anesthetist

## 2021-05-21 ENCOUNTER — Encounter (HOSPITAL_COMMUNITY)
Admission: RE | Disposition: A | Discharge status: Home Health | Payer: Self-pay | Source: Ambulatory Visit | Attending: Orthopedic Surgery

## 2021-05-21 ENCOUNTER — Ambulatory Visit (HOSPITAL_COMMUNITY): Payer: Medicare Other | Admitting: Physician Assistant

## 2021-05-21 ENCOUNTER — Encounter (HOSPITAL_COMMUNITY): Payer: Self-pay | Admitting: Orthopedic Surgery

## 2021-05-21 DIAGNOSIS — Z87891 Personal history of nicotine dependence: Secondary | ICD-10-CM | POA: Insufficient documentation

## 2021-05-21 DIAGNOSIS — R0602 Shortness of breath: Secondary | ICD-10-CM | POA: Insufficient documentation

## 2021-05-21 DIAGNOSIS — Z96611 Presence of right artificial shoulder joint: Secondary | ICD-10-CM | POA: Insufficient documentation

## 2021-05-21 DIAGNOSIS — I4891 Unspecified atrial fibrillation: Secondary | ICD-10-CM | POA: Insufficient documentation

## 2021-05-21 DIAGNOSIS — I10 Essential (primary) hypertension: Secondary | ICD-10-CM

## 2021-05-21 DIAGNOSIS — G4730 Sleep apnea, unspecified: Secondary | ICD-10-CM | POA: Insufficient documentation

## 2021-05-21 DIAGNOSIS — M1711 Unilateral primary osteoarthritis, right knee: Principal | ICD-10-CM | POA: Insufficient documentation

## 2021-05-21 DIAGNOSIS — I2510 Atherosclerotic heart disease of native coronary artery without angina pectoris: Secondary | ICD-10-CM | POA: Diagnosis not present

## 2021-05-21 DIAGNOSIS — M21061 Valgus deformity, not elsewhere classified, right knee: Secondary | ICD-10-CM

## 2021-05-21 DIAGNOSIS — Z955 Presence of coronary angioplasty implant and graft: Secondary | ICD-10-CM | POA: Insufficient documentation

## 2021-05-21 DIAGNOSIS — Z96651 Presence of right artificial knee joint: Secondary | ICD-10-CM | POA: Diagnosis present

## 2021-05-21 HISTORY — PX: TOTAL KNEE ARTHROPLASTY: SHX125

## 2021-05-21 SURGERY — ARTHROPLASTY, KNEE, TOTAL
Anesthesia: Spinal | Site: Knee | Laterality: Right

## 2021-05-21 MED ORDER — PHENYLEPHRINE HCL-NACL 20-0.9 MG/250ML-% IV SOLN
INTRAVENOUS | Status: AC
  Filled 2021-05-21: qty 500

## 2021-05-21 MED ORDER — ONDANSETRON HCL 4 MG/2ML IJ SOLN
INTRAMUSCULAR | Status: DC | PRN
  Administered 2021-05-21: 4 mg via INTRAVENOUS

## 2021-05-21 MED ORDER — PHENYLEPHRINE 40 MCG/ML (10ML) SYRINGE FOR IV PUSH (FOR BLOOD PRESSURE SUPPORT)
PREFILLED_SYRINGE | INTRAVENOUS | Status: DC | PRN
  Administered 2021-05-21 (×3): 80 ug via INTRAVENOUS
  Administered 2021-05-21: 120 ug via INTRAVENOUS

## 2021-05-21 MED ORDER — OXYCODONE HCL 5 MG PO TABS
10.0000 mg | ORAL_TABLET | ORAL | Status: DC | PRN
  Administered 2021-05-21 – 2021-05-22 (×4): 10 mg via ORAL
  Filled 2021-05-21 (×4): qty 2

## 2021-05-21 MED ORDER — SODIUM CHLORIDE 0.9 % IV SOLN: INTRAVENOUS | Status: DC

## 2021-05-21 MED ORDER — DOCUSATE SODIUM 100 MG PO CAPS: 100.0000 mg | ORAL_CAPSULE | Freq: Two times a day (BID) | ORAL | 0 refills | Status: DC

## 2021-05-21 MED ORDER — PANTOPRAZOLE SODIUM 40 MG PO TBEC
40.0000 mg | DELAYED_RELEASE_TABLET | Freq: Every day | ORAL | Status: DC
  Administered 2021-05-21 – 2021-05-22 (×2): 40 mg via ORAL
  Filled 2021-05-21 (×2): qty 1

## 2021-05-21 MED ORDER — ONDANSETRON HCL 4 MG/2ML IJ SOLN
INTRAMUSCULAR | Status: AC
  Filled 2021-05-21: qty 2

## 2021-05-21 MED ORDER — BUPIVACAINE-EPINEPHRINE 0.5% -1:200000 IJ SOLN
INTRAMUSCULAR | Status: DC | PRN
  Administered 2021-05-21: 30 mL

## 2021-05-21 MED ORDER — FENTANYL CITRATE (PF) 100 MCG/2ML IJ SOLN
INTRAMUSCULAR | Status: AC
  Filled 2021-05-21: qty 2

## 2021-05-21 MED ORDER — ALUM & MAG HYDROXIDE-SIMETH 200-200-20 MG/5ML PO SUSP: 30.0000 mL | ORAL | Status: DC | PRN

## 2021-05-21 MED ORDER — TRANEXAMIC ACID-NACL 1000-0.7 MG/100ML-% IV SOLN
1000.0000 mg | INTRAVENOUS | Status: AC
  Administered 2021-05-21: 1000 mg via INTRAVENOUS
  Filled 2021-05-21: qty 100

## 2021-05-21 MED ORDER — METOPROLOL TARTRATE 5 MG/5ML IV SOLN
INTRAVENOUS | Status: DC | PRN
  Administered 2021-05-21: 2 mg via INTRAVENOUS

## 2021-05-21 MED ORDER — OXYCODONE HCL 5 MG PO TABS: 5.0000 mg | ORAL_TABLET | Freq: Once | ORAL | Status: DC | PRN

## 2021-05-21 MED ORDER — MAGNESIUM OXIDE -MG SUPPLEMENT 400 (240 MG) MG PO TABS
200.0000 mg | ORAL_TABLET | Freq: Every day | ORAL | Status: DC
  Administered 2021-05-21 – 2021-05-22 (×2): 200 mg via ORAL
  Filled 2021-05-21 (×2): qty 1

## 2021-05-21 MED ORDER — LACTATED RINGERS IV SOLN: INTRAVENOUS | Status: DC

## 2021-05-21 MED ORDER — ORAL CARE MOUTH RINSE: 15.0000 mL | Freq: Once | OROMUCOSAL | Status: AC

## 2021-05-21 MED ORDER — PROPOFOL 10 MG/ML IV BOLUS
INTRAVENOUS | Status: DC | PRN
  Administered 2021-05-21 (×3): 20 mg via INTRAVENOUS

## 2021-05-21 MED ORDER — BUPIVACAINE LIPOSOME 1.3 % IJ SUSP
INTRAMUSCULAR | Status: DC | PRN
  Administered 2021-05-21: 20 mL

## 2021-05-21 MED ORDER — BISACODYL 5 MG PO TBEC: 5.0000 mg | DELAYED_RELEASE_TABLET | Freq: Every day | ORAL | Status: DC | PRN

## 2021-05-21 MED ORDER — METHOCARBAMOL 500 MG IVPB - SIMPLE MED
500.0000 mg | Freq: Four times a day (QID) | INTRAVENOUS | Status: DC | PRN
  Filled 2021-05-21: qty 50

## 2021-05-21 MED ORDER — CEFAZOLIN SODIUM-DEXTROSE 2-4 GM/100ML-% IV SOLN
2.0000 g | INTRAVENOUS | Status: AC
Start: 2021-05-21 — End: 2021-05-21
  Administered 2021-05-21: 2 g via INTRAVENOUS
  Filled 2021-05-21: qty 100

## 2021-05-21 MED ORDER — MAGNESIUM 250 MG PO TABS
250.0000 mg | ORAL_TABLET | Freq: Every day | ORAL | Status: DC
Start: 2021-05-21 — End: 2021-05-21

## 2021-05-21 MED ORDER — HYDROMORPHONE HCL 1 MG/ML IJ SOLN: 0.5000 mg | INTRAMUSCULAR | Status: DC | PRN

## 2021-05-21 MED ORDER — TIZANIDINE HCL 2 MG PO TABS: 2.0000 mg | ORAL_TABLET | Freq: Three times a day (TID) | ORAL | 0 refills | Status: DC | PRN

## 2021-05-21 MED ORDER — FENTANYL CITRATE PF 50 MCG/ML IJ SOSY: 25.0000 ug | PREFILLED_SYRINGE | INTRAMUSCULAR | Status: DC | PRN

## 2021-05-21 MED ORDER — SODIUM CHLORIDE 0.9 % IR SOLN
Status: DC | PRN
  Administered 2021-05-21: 3000 mL

## 2021-05-21 MED ORDER — DOCUSATE SODIUM 100 MG PO CAPS
100.0000 mg | ORAL_CAPSULE | Freq: Two times a day (BID) | ORAL | Status: DC
  Administered 2021-05-21 – 2021-05-22 (×2): 100 mg via ORAL
  Filled 2021-05-21 (×2): qty 1

## 2021-05-21 MED ORDER — BUPIVACAINE LIPOSOME 1.3 % IJ SUSP: 20.0000 mL | Freq: Once | INTRAMUSCULAR | Status: DC

## 2021-05-21 MED ORDER — DIPHENHYDRAMINE HCL 12.5 MG/5ML PO ELIX: 12.5000 mg | ORAL_SOLUTION | ORAL | Status: DC | PRN

## 2021-05-21 MED ORDER — DEXAMETHASONE SODIUM PHOSPHATE 10 MG/ML IJ SOLN
10.0000 mg | Freq: Two times a day (BID) | INTRAMUSCULAR | Status: AC
  Administered 2021-05-21 – 2021-05-22 (×3): 10 mg via INTRAVENOUS
  Filled 2021-05-21 (×3): qty 1

## 2021-05-21 MED ORDER — MENTHOL 3 MG MT LOZG: 1.0000 | LOZENGE | OROMUCOSAL | Status: DC | PRN

## 2021-05-21 MED ORDER — FENTANYL CITRATE (PF) 100 MCG/2ML IJ SOLN
INTRAMUSCULAR | Status: DC | PRN
  Administered 2021-05-21 (×2): 25 ug via INTRAVENOUS

## 2021-05-21 MED ORDER — 0.9 % SODIUM CHLORIDE (POUR BTL) OPTIME
TOPICAL | Status: DC | PRN
  Administered 2021-05-21: 1000 mL

## 2021-05-21 MED ORDER — ROPIVACAINE HCL 5 MG/ML IJ SOLN
INTRAMUSCULAR | Status: DC | PRN
  Administered 2021-05-21: 20 mL via PERINEURAL

## 2021-05-21 MED ORDER — DEXAMETHASONE SODIUM PHOSPHATE 10 MG/ML IJ SOLN
INTRAMUSCULAR | Status: AC
  Filled 2021-05-21: qty 1

## 2021-05-21 MED ORDER — BUPIVACAINE-EPINEPHRINE 0.5% -1:200000 IJ SOLN
INTRAMUSCULAR | Status: AC
  Filled 2021-05-21: qty 1

## 2021-05-21 MED ORDER — OXYCODONE HCL 5 MG/5ML PO SOLN: 5.0000 mg | Freq: Once | ORAL | Status: DC | PRN

## 2021-05-21 MED ORDER — OXYCODONE-ACETAMINOPHEN 5-325 MG PO TABS: 1.0000 | ORAL_TABLET | Freq: Four times a day (QID) | ORAL | 0 refills | Status: DC | PRN

## 2021-05-21 MED ORDER — CEFAZOLIN SODIUM-DEXTROSE 2-4 GM/100ML-% IV SOLN
2.0000 g | Freq: Four times a day (QID) | INTRAVENOUS | Status: AC
  Administered 2021-05-21 (×2): 2 g via INTRAVENOUS
  Filled 2021-05-21 (×2): qty 100

## 2021-05-21 MED ORDER — METHOCARBAMOL 500 MG PO TABS
500.0000 mg | ORAL_TABLET | Freq: Four times a day (QID) | ORAL | Status: DC | PRN
  Administered 2021-05-21 (×2): 500 mg via ORAL
  Filled 2021-05-21 (×2): qty 1

## 2021-05-21 MED ORDER — ONDANSETRON HCL 4 MG/2ML IJ SOLN: 4.0000 mg | Freq: Four times a day (QID) | INTRAMUSCULAR | Status: DC | PRN

## 2021-05-21 MED ORDER — POLYETHYLENE GLYCOL 3350 17 G PO PACK: 17.0000 g | PACK | Freq: Every day | ORAL | Status: DC | PRN

## 2021-05-21 MED ORDER — SODIUM CHLORIDE 0.9% FLUSH
INTRAVENOUS | Status: DC | PRN
Start: 2021-05-21 — End: 2021-05-21
  Administered 2021-05-21: 50 mL

## 2021-05-21 MED ORDER — WATER FOR IRRIGATION, STERILE IR SOLN
Status: DC | PRN
Start: 2021-05-21 — End: 2021-05-21
  Administered 2021-05-21: 2000 mL

## 2021-05-21 MED ORDER — PHENOL 1.4 % MT LIQD: 1.0000 | OROMUCOSAL | Status: DC | PRN

## 2021-05-21 MED ORDER — METOPROLOL TARTRATE 25 MG PO TABS
25.0000 mg | ORAL_TABLET | Freq: Every day | ORAL | Status: DC
  Administered 2021-05-21 – 2021-05-22 (×2): 25 mg via ORAL
  Filled 2021-05-21 (×2): qty 1

## 2021-05-21 MED ORDER — ZOLPIDEM TARTRATE 5 MG PO TABS: 5.0000 mg | ORAL_TABLET | Freq: Every evening | ORAL | Status: DC | PRN

## 2021-05-21 MED ORDER — BUPIVACAINE LIPOSOME 1.3 % IJ SUSP
INTRAMUSCULAR | Status: AC
  Filled 2021-05-21: qty 20

## 2021-05-21 MED ORDER — ONDANSETRON HCL 4 MG PO TABS: 4.0000 mg | ORAL_TABLET | Freq: Four times a day (QID) | ORAL | Status: DC | PRN

## 2021-05-21 MED ORDER — PROPOFOL 500 MG/50ML IV EMUL
INTRAVENOUS | Status: DC | PRN
  Administered 2021-05-21: 50 ug/kg/min via INTRAVENOUS

## 2021-05-21 MED ORDER — TRANEXAMIC ACID-NACL 1000-0.7 MG/100ML-% IV SOLN
1000.0000 mg | Freq: Once | INTRAVENOUS | Status: AC
  Administered 2021-05-21: 1000 mg via INTRAVENOUS
  Filled 2021-05-21: qty 100

## 2021-05-21 MED ORDER — CHLORHEXIDINE GLUCONATE 0.12 % MT SOLN
15.0000 mL | Freq: Once | OROMUCOSAL | Status: AC
  Administered 2021-05-21: 15 mL via OROMUCOSAL

## 2021-05-21 MED ORDER — ACETAMINOPHEN 325 MG PO TABS
325.0000 mg | ORAL_TABLET | Freq: Four times a day (QID) | ORAL | Status: DC | PRN
  Administered 2021-05-22: 650 mg via ORAL
  Filled 2021-05-21: qty 2

## 2021-05-21 MED ORDER — PROPOFOL 1000 MG/100ML IV EMUL
INTRAVENOUS | Status: AC
  Filled 2021-05-21: qty 100

## 2021-05-21 SURGICAL SUPPLY — 55 items
ATTUNE MED DOME PAT 41 KNEE (Knees) ×1 IMPLANT
ATTUNE PS FEM RT SZ 7 CEM KNEE (Femur) ×1 IMPLANT
ATTUNE PSRP INSR SZ7 5 KNEE (Insert) ×1 IMPLANT
BAG COUNTER SPONGE SURGICOUNT (BAG) IMPLANT
BAG ZIPLOCK 12X15 (MISCELLANEOUS) ×2 IMPLANT
BASE TIBIAL ROT PLAT SZ 7 KNEE (Knees) IMPLANT
BENZOIN TINCTURE PRP APPL 2/3 (GAUZE/BANDAGES/DRESSINGS) ×2 IMPLANT
BLADE SAGITTAL 25.0X1.19X90 (BLADE) ×2 IMPLANT
BLADE SAW SGTL 13.0X1.19X90.0M (BLADE) ×2 IMPLANT
BLADE SURG SZ10 CARB STEEL (BLADE) ×2 IMPLANT
BNDG ELASTIC 6X5.8 VLCR STR LF (GAUZE/BANDAGES/DRESSINGS) ×2 IMPLANT
BOOTIES KNEE HIGH SLOAN (MISCELLANEOUS) ×2 IMPLANT
BOWL SMART MIX CTS (DISPOSABLE) ×2 IMPLANT
CEMENT HV SMART SET (Cement) ×4 IMPLANT
COVER SURGICAL LIGHT HANDLE (MISCELLANEOUS) ×2 IMPLANT
CUFF TOURN SGL QUICK 34 (TOURNIQUET CUFF) ×2
CUFF TRNQT CYL 34X4.125X (TOURNIQUET CUFF) ×1 IMPLANT
DRAPE INCISE IOBAN 66X45 STRL (DRAPES) ×2 IMPLANT
DRAPE U-SHAPE 47X51 STRL (DRAPES) ×2 IMPLANT
DRSG AQUACEL AG ADV 3.5X10 (GAUZE/BANDAGES/DRESSINGS) ×2 IMPLANT
DURAPREP 26ML APPLICATOR (WOUND CARE) ×2 IMPLANT
ELECT REM PT RETURN 15FT ADLT (MISCELLANEOUS) ×2 IMPLANT
GLOVE SRG 8 PF TXTR STRL LF DI (GLOVE) ×2 IMPLANT
GLOVE SURG NEOP MICRO LF SZ7.5 (GLOVE) ×4 IMPLANT
GLOVE SURG POLYISO LF SZ7 (GLOVE) ×1 IMPLANT
GLOVE SURG UNDER POLY LF SZ7 (GLOVE) ×1 IMPLANT
GLOVE SURG UNDER POLY LF SZ8 (GLOVE) ×4
GOWN SRG XL 47XLVL 4 REINF (GOWN DISPOSABLE) IMPLANT
GOWN STRL NON-REIN XL LVL4 (GOWN DISPOSABLE) ×2
GOWN STRL REUS W/TWL XL LVL3 (GOWN DISPOSABLE) ×4 IMPLANT
HANDPIECE INTERPULSE COAX TIP (DISPOSABLE) ×2
HOLDER FOLEY CATH W/STRAP (MISCELLANEOUS) IMPLANT
HOOD PEEL AWAY FLYTE STAYCOOL (MISCELLANEOUS) ×6 IMPLANT
IV NS IRRIG 3000ML ARTHROMATIC (IV SOLUTION) ×1 IMPLANT
KIT TURNOVER KIT A (KITS) IMPLANT
MANIFOLD NEPTUNE II (INSTRUMENTS) ×2 IMPLANT
NEEDLE HYPO 22GX1.5 SAFETY (NEEDLE) ×4 IMPLANT
NS IRRIG 1000ML POUR BTL (IV SOLUTION) ×2 IMPLANT
PACK TOTAL KNEE CUSTOM (KITS) ×2 IMPLANT
PADDING CAST COTTON 6X4 STRL (CAST SUPPLIES) ×2 IMPLANT
PROTECTOR NERVE ULNAR (MISCELLANEOUS) ×2 IMPLANT
SET HNDPC FAN SPRY TIP SCT (DISPOSABLE) ×1 IMPLANT
SPIKE FLUID TRANSFER (MISCELLANEOUS) ×4 IMPLANT
SPONGE T-LAP 18X18 ~~LOC~~+RFID (SPONGE) ×6 IMPLANT
STRIP CLOSURE SKIN 1/2X4 (GAUZE/BANDAGES/DRESSINGS) ×1 IMPLANT
SUT MNCRL AB 3-0 PS2 18 (SUTURE) ×2 IMPLANT
SUT VIC AB 0 CT1 36 (SUTURE) ×2 IMPLANT
SUT VIC AB 1 CT1 36 (SUTURE) ×4 IMPLANT
SUT VIC AB 2-0 CT1 27 (SUTURE) ×2
SUT VIC AB 2-0 CT1 TAPERPNT 27 (SUTURE) IMPLANT
SYR CONTROL 10ML LL (SYRINGE) ×4 IMPLANT
TIBIAL BASE ROT PLAT SZ 7 KNEE (Knees) ×2 IMPLANT
TRAY FOLEY MTR SLVR 16FR STAT (SET/KITS/TRAYS/PACK) ×2 IMPLANT
WATER STERILE IRR 1000ML POUR (IV SOLUTION) ×4 IMPLANT
WRAP KNEE MAXI GEL POST OP (GAUZE/BANDAGES/DRESSINGS) ×2 IMPLANT

## 2021-05-21 NOTE — Progress Notes (Signed)
Orthopedic Tech Progress Note ?Patient Details:  ?Nathan Rodriguez ?[birth year] ?[id number] ? ?CPM Right Knee ?CPM Right Knee: On ?Right Knee Flexion (Degrees): 70 ?Right Knee Extension (Degrees): 0 ? ?Post Interventions ?Patient Tolerated: Well ?Ortho Devices ?Type of Ortho Device: Bone foam zero knee ?Ortho Device/Splint Location: RLE ?Ortho Device/Splint Interventions: Application ?  ?Post Interventions ?Patient Tolerated: Well ? ?Nathan Rodriguez ?05/21/2021, 10:16 AM ? ?

## 2021-05-21 NOTE — Evaluation (Signed)
Physical Therapy Evaluation ?Patient Details ?Name: Nathan Rodriguez ?MRN: [id number] ?DOB: 1945/10/23 ?Today's Date: 05/21/2021 ? ?History of Present Illness ? Pt s/p R TKR and with hx of CAD, DDD, scoliosis, Tinnitis,, L RCR, and R TSR(7/22)  ?Clinical Impression ? Pt s/p R TKR and presents with decreased R LE strength/ROM, post op pain and balance deficits limiting functional mobility.  Pt should progress to dc home with family assist. ?   ? ?Recommendations for follow up therapy are one component of a multi-disciplinary discharge planning process, led by the attending physician.  Recommendations may be updated based on patient status, additional functional criteria and insurance authorization. ? ?Follow Up Recommendations Follow physician's recommendations for discharge plan and follow up therapies ? ?  ?Assistance Recommended at Discharge Frequent or constant Supervision/Assistance  ?Patient can return home with the following ? A little help with walking and/or transfers;A little help with bathing/dressing/bathroom;Assist for transportation;Help with stairs or ramp for entrance;Assistance with cooking/housework ? ?  ?Equipment Recommendations None recommended by PT  ?Recommendations for Other Services ?    ?  ?Functional Status Assessment Patient has had a recent decline in their functional status and demonstrates the ability to make significant improvements in function in a reasonable and predictable amount of time.  ? ?  ?Precautions / Restrictions Precautions ?Precautions: Fall;Knee ?Restrictions ?Weight Bearing Restrictions: No ?Other Position/Activity Restrictions: WBAT  ? ?  ? ?Mobility ? Bed Mobility ?Overal bed mobility: Needs Assistance ?Bed Mobility: Supine to Sit ?  ?  ?Supine to sit: Min assist ?  ?  ?General bed mobility comments: cues for sequence and use of L LE to self assist ?  ? ?Transfers ?Overall transfer level: Needs assistance ?Equipment used: Rolling walker (2 wheels) ?Transfers: Sit to/from  Stand ?Sit to Stand: Min assist, Mod assist ?  ?  ?  ?  ?  ?General transfer comment: cues for LE management and use of UEs to self assist.  Physical assist to bring wt up and fwd and to balance in initial standing ?  ? ?Ambulation/Gait ?Ambulation/Gait assistance: Min assist ?Gait Distance (Feet): 48 Feet ?Assistive device: Rolling walker (2 wheels) ?Gait Pattern/deviations: Step-to pattern, Decreased step length - right, Decreased step length - left, Shuffle, Trunk flexed ?Gait velocity: decr ?  ?  ?General Gait Details: cues for sequence, posture and position from RW ? ?Stairs ?  ?  ?  ?  ?  ? ?Wheelchair Mobility ?  ? ?Modified Rankin (Stroke Patients Only) ?  ? ?  ? ?Balance Overall balance assessment: Needs assistance ?Sitting-balance support: No upper extremity supported, Feet supported ?Sitting balance-Leahy Scale: Good ?  ?  ?Standing balance support: Bilateral upper extremity supported ?Standing balance-Leahy Scale: Poor ?  ?  ?  ?  ?  ?  ?  ?  ?  ?  ?  ?  ?   ? ? ? ?Pertinent Vitals/Pain Pain Assessment ?Pain Assessment: 0-10 ?Pain Score: 5  ?Pain Location: L knee ?Pain Descriptors / Indicators: Aching, Sore ?Pain Intervention(s): Limited activity within patient's tolerance, Monitored during session, Premedicated before session, Ice applied  ? ? ?Home Living Family/patient expects to be discharged to:: Private residence ?Living Arrangements: Spouse/significant other ?Available Help at Discharge: Family;Available 24 hours/day ?Type of Home: House ?Home Access: Stairs to enter ?  ?Entrance Stairs-Number of Steps: 1 ?Alternate Level Stairs-Number of Steps: flight ?Home Layout: Two level ?Home Equipment: Conservation officer, nature (2 wheels);Cane - single point;Toilet riser ?   ?  ?Prior Function Prior Level  of Function : Independent/Modified Independent ?  ?  ?  ?  ?  ?  ?  ?  ?  ? ? ?Hand Dominance  ? Dominant Hand: Left ? ?  ?Extremity/Trunk Assessment  ? Upper Extremity Assessment ?Upper Extremity Assessment: RUE  deficits/detail ?RUE Deficits / Details: shoulder elevation to ~ 90 with decreased strength since TSR 7/22 ?  ? ?Lower Extremity Assessment ?Lower Extremity Assessment: RLE deficits/detail ?  ? ?Cervical / Trunk Assessment ?Cervical / Trunk Assessment: Normal  ?Communication  ? Communication: No difficulties  ?Cognition Arousal/Alertness: Awake/alert ?Behavior During Therapy: [facility] for tasks assessed/performed ?Overall Cognitive Status: Within Functional Limits for tasks assessed ?  ?  ?  ?  ?  ?  ?  ?  ?  ?  ?  ?  ?  ?  ?  ?  ?  ?  ?  ? ?  ?General Comments   ? ?  ?Exercises Total Joint Exercises ?Ankle Circles/Pumps: AROM, Both, 15 reps, Supine  ? ?Assessment/Plan  ?  ?PT Assessment Patient needs continued PT services  ?PT Problem List Decreased strength;Decreased range of motion;Decreased activity tolerance;Decreased balance;Decreased mobility;Decreased knowledge of use of DME;Pain ? ?   ?  ?PT Treatment Interventions Stair training;Gait training;DME instruction;Functional mobility training;Therapeutic activities;Therapeutic exercise;Patient/family education   ? ?PT Goals (Current goals can be found in the Care Plan section)  ?Acute Rehab PT Goals ?Patient Stated Goal: Regain IND ?PT Goal Formulation: With patient ?Time For Goal Achievement: 05/28/21 ?Potential to Achieve Goals: Good ? ?  ?Frequency 7X/week ?  ? ? ?Co-evaluation   ?  ?  ?  ?  ? ? ?  ?AM-PAC PT "6 Clicks" Mobility  ?Outcome Measure Help needed turning from your back to your side while in a flat bed without using bedrails?: A Little ?Help needed moving from lying on your back to sitting on the side of a flat bed without using bedrails?: A Little ?Help needed moving to and from a bed to a chair (including a wheelchair)?: A Little ?Help needed standing up from a chair using your arms (e.g., wheelchair or bedside chair)?: A Little ?Help needed to walk in hospital room?: A Little ?Help needed climbing 3-5 steps with a railing? : A Lot ?6 Click Score:  17 ? ?  ?End of Session Equipment Utilized During Treatment: Gait belt ?Activity Tolerance: Patient tolerated treatment well ?Patient left: in chair;with call bell/phone within reach;with chair alarm set;with nursing/sitter in room ?Nurse Communication: Mobility status ?PT Visit Diagnosis: Unsteadiness on feet (R26.81);Difficulty in walking, not elsewhere classified (R26.2);Pain ?Pain - Right/Left: Right ?Pain - part of body: Knee ?  ? ?Time: 0354-6568 ?PT Time Calculation (min) (ACUTE ONLY): 27 min ? ? ?Charges:   PT Evaluation ?$PT Eval Low Complexity: 1 Low ?PT Treatments ?$Gait Training: 8-22 mins ?  ?   ? ? ?Debe Coder PT ?Acute Rehabilitation Services ?Pager [phone] ?Office [phone] ? ? ?Kenza Munar ?05/21/2021, 4:01 PM ? ?

## 2021-05-21 NOTE — Plan of Care (Signed)
  Problem: Education: Goal: Knowledge of General Education information will improve Description: Including pain rating scale, medication(s)/side effects and non-pharmacologic comfort measures Outcome: Progressing   Problem: Activity: Goal: Risk for activity intolerance will decrease Outcome: Progressing   Problem: Pain Managment: Goal: General experience of comfort will improve Outcome: Progressing   

## 2021-05-21 NOTE — Anesthesia Postprocedure Evaluation (Signed)
Anesthesia Post Note ? ?Patient: Nathan Rodriguez ? ?Procedure(s) Performed: TOTAL KNEE ARTHROPLASTY (Right: Knee) ? ?  ? ?Patient location during evaluation: PACU ?Anesthesia Type: Spinal, Regional and MAC ?Level of consciousness: oriented and awake and alert ?Pain management: pain level controlled ?Vital Signs Assessment: post-procedure vital signs reviewed and stable ?Respiratory status: spontaneous breathing, respiratory function stable and patient connected to nasal cannula oxygen ?Cardiovascular status: blood pressure returned to baseline and stable ?Postop Assessment: no headache, no backache and no apparent nausea or vomiting ?Anesthetic complications: no ? ? ?No notable events documented. ? ?Last Vitals:  ?Vitals:  ? 05/21/21 1100 05/21/21 1332  ?BP: 131/80 (!) 141/84  ?Pulse: 60 61  ?Resp: 14 18  ?Temp: (!) 36.4 ?C   ?SpO2: 98% 99%  ?  ?Last Pain:  ?Vitals:  ? 05/21/21 1253  ?TempSrc:   ?PainSc: 3   ? ? ?  ?  ?  ?  ?  ?  ? ?[place] S ? ? ? ? ?

## 2021-05-21 NOTE — Anesthesia Procedure Notes (Signed)
Anesthesia Regional Block: Adductor canal block  ? ?Pre-Anesthetic Checklist: , timeout performed,  Correct Patient, Correct Site, Correct Laterality,  Correct Procedure, Correct Position, site marked,  Risks and benefits discussed,  Surgical consent,  Pre-op evaluation,  At surgeon's request and post-op pain management ? ?Laterality: Right ? ?Prep: chloraprep     ?  ?Needles:  ?Injection technique: Single-shot ? ?Needle Type: Echogenic Needle   ? ? ?Needle Length: 9cm  ?Needle Gauge: 21  ? ? ? ?Additional Needles: ? ? ?Narrative:  ?Start time: 05/21/2021 7:00 AM ?End time: 05/21/2021 7:10 AM ?Injection made incrementally with aspirations every 5 mL. ? ?Performed by: Personally  ?Anesthesiologist: Albertha Ghee, MD ? ?Additional Notes: ?Pt tolerated the procedure well. ? ? ? ? ?

## 2021-05-21 NOTE — Progress Notes (Deleted)
Orthopedic Tech Progress Note ?Patient Details:  ?Nathan Rodriguez ?Feb 14, 1946 ?[id number] ? ?Ortho Devices ?Type of Ortho Device: Bone foam zero knee ?Ortho Device/Splint Location: RLE ?Ortho Device/Splint Interventions: Application ?  ?Post Interventions ?Patient Tolerated: Well ? ?Nathan Rodriguez ?05/21/2021, 10:15 AM ? ?
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
## 2021-05-21 NOTE — Op Note (Signed)
PATIENT ID:      Nathan Rodriguez  ?MRN:     [id number] ?DOB/AGE:    Nov 26, 1945 / 76 y.o. ? ? ?    OPERATIVE REPORT  ? ?DATE OF PROCEDURE:  05/21/2021   ?   ?PREOPERATIVE DIAGNOSIS:   RIGHT KNEE DEGENERATIVE JOINT DISEASE  ?    Estimated body mass index is 23.34 kg/m? as calculated from the following: ?  Height as of this encounter: 5' 10.5" (1.791 m). ?  Weight as of this encounter: 74.8 kg. ?                                                      ?POSTOPERATIVE DIAGNOSIS:   Same                                                                ?  ?PROCEDURE:  Procedure(s): ?TOTAL KNEE ARTHROPLASTY Using DepuyAttune RP implants #7 Femur, #7Tibia, 5 mm Attune RP bearing, 41 Patella ?   ?SURGEON: Alta Corning  ?ASSISTANT:  Gaspar Skeeters PA-C   (Present and scrubbed throughout the case, critical for assistance with exposure, retraction, instrumentation, and closure.)      ?  ?ANESTHESIA: spinal, 20cc Exparel, 50cc 0.25% Marcaine ?EBL: min cc ?FLUID REPLACEMENT: unk cc crystaloid ?TOURNIQUET: ?DRAINS: None ?TRANEXAMIC ACID: 1gm IV, 2gm topical ?COMPLICATIONS:  None      ?   ?INDICATIONS FOR PROCEDURE: The patient has ? RIGHT KNEE DEGENERATIVE JOINT DISEASE, mild valgus deformities, XR shows bone on bone arthritis, lateral subluxation of tibia. Patient has failed all conservative measures including anti-inflammatory medicines, narcotics, attempts at exercise and weight loss, cortisone injections and viscosupplementation.  Risks and benefits of surgery have been discussed, questions answered.  ? ?DESCRIPTION OF PROCEDURE: The patient identified by armband, received  IV antibiotics, in the holding area at [facility]. Patient taken to the operating room, appropriate anesthetic monitors were attached, and spinal anesthesia was  induced. IV Tranexamic acid was given.Tourniquet applied high to the operative thigh. Lateral post and foot positioner applied to the table, the lower extremity was then prepped and draped in usual  sterile fashion from the toes to the tourniquet. Time-out procedure was performed. Gaspar Skeeters PAC, was present and scrubbed throughout the case, critical for assistance with, positioning, exposure, retraction, instrumentation, and closure.The skin and subcutaneous tissue along the incision was injected with 20 cc of a mixture of Exparel and Marcaine solution, using a 20-gauge by 1-1/2 inch needle. We began the operation, with the knee flexed 130 degrees, by making the anterior midline incision starting at handbreadth above the patella going over the patella 1 cm medial to and 4 cm distal to the tibial tubercle. Small bleeders in the skin and the subcutaneous tissue identified and cauterized. Transverse retinaculum was incised and reflected medially and a medial parapatellar arthrotomy was accomplished. the patella was everted and theprepatellar fat pad resected. The superficial medial collateral ligament was then elevated from anterior to posterior along the proximal flare of the tibia and anterior half of the menisci resected. The knee was hyperflexed exposing bone on bone arthritis.  Peripheral and notch osteophytes as well as the cruciate ligaments were then resected. We continued to work our way around posteriorly along the proximal tibia, and externally rotated the tibia subluxing it out from underneath the femur. A McHale PCL retractor was placed through the notch and a lateral Hohmann retractor placed, and we then entered the proximal tibia in line with the Depuy starter drill in line with the axis of the tibia followed by an intramedullary guide rod and 0-degree posterior slope cutting guide. The tibial cutting guide, 4 degree posterior sloped, was pinned into place allowing resection of 81mm of bone medially and 2 mm of bone laterally. Satisfied with the tibial resection, we then entered the distal femur 2 mm anterior to the PCL origin with the intramedullary guide rod and applied the distal femoral cutting  guide set at 9 mm, with 5 degrees of valgus. This was pinned along the epicondylar axis. At this point, the distal femoral cut was accomplished without difficulty. We then sized for a #7 femoral component and pinned the guide in 3 degrees of external rotation. The chamfer cutting guide was pinned into place. The anterior, posterior, and chamfer cuts were accomplished without difficulty followed by the Attune RP box cutting guide and the box cut. We also removed posterior osteophytes from the posterior femoral condyles. The posterior capsule was injected with Exparel solution. The knee was brought into full extension. We checked our extension gap and fit a 5 mm bearing. Distracting in extension with a lamina spreader,  bleeders in the posterior capsule, Posterior medial and posterior lateral gutter were cauterized.  The transexamic acid-soaked sponge was then placed in the gap of the knee in extension. The knee was flexed 30?Marland Kitchen The posterior patella cut was accomplished with the 9.5 mm Attune cutting guide, sized for a 41 mm dome, and the fixation pegs drilled.The knee was then once again hyperflexed exposing the proximal tibia. We sized for a # 7 tibial base plate, applied the smokestack and the conical reamer followed by the the Delta fin keel punch. We then hammered into place the Attune RP trial femoral component, drilled the lugs, inserted a  5 mm trial bearing, trial patellar button, and took the knee through range of motion from 0-130 degrees. Medial and lateral ligamentous stability was checked. No thumb pressure was required for patellar Tracking. The tourniquet was released at 45 min. All trial components were removed, mating surfaces irrigated with pulse lavage, and dried with suction and sponges. 10 cc of the Exparel solution was applied to the cancellus bone of the patella distal femur and proximal tibia.  After waiting 30 seconds, the bony surfaces were again, dried with sponges. A double batch of DePuy  HV cement was mixed and applied to all bony metallic mating surfaces except for the posterior condyles of the femur itself. In order, we hammered into place the tibial tray and removed excess cement, the femoral component and removed excess cement. The final Attune RP bearing was inserted, and the knee brought to full extension with compression. The patellar button was clamped into place, and excess cement removed. The knee was held at 30? flexion with compression, while the cement cured. The wound was irrigated out with normal saline solution pulse lavage. The rest of the Exparel was injected into the parapatellar arthrotomy, subcutaneous tissues, and periosteal tissues. The parapatellar arthrotomy was closed with running #1 Vicryl suture. The subcutaneous tissue with 3-0 undyed Vicryl suture, and the skin with running 3-0 SQ vicryl.  An Aquacil and Ace wrap were applied. The patient was taken to recovery room without difficulty.  ? ?Alta Corning ?05/21/2021, 9:00 AM ?  ?

## 2021-05-21 NOTE — Discharge Instructions (Signed)
INSTRUCTIONS AFTER JOINT REPLACEMENT  ° °Remove items at home which could result in a fall. This includes throw rugs or furniture in walking pathways °ICE to the affected joint every three hours while awake for 30 minutes at a time, for at least the first 3-5 days, and then as needed for pain and swelling.  Continue to use ice for pain and swelling. You may notice swelling that will progress down to the foot and ankle.  This is normal after surgery.  Elevate your leg when you are not up walking on it.   °Continue to use the breathing machine you got in the hospital (incentive spirometer) which will help keep your temperature down.  It is common for your temperature to cycle up and down following surgery, especially at night when you are not up moving around and exerting yourself.  The breathing machine keeps your lungs expanded and your temperature down. ° ° °DIET:  As you were doing prior to hospitalization, we recommend a well-balanced diet. ° °DRESSING / WOUND CARE / SHOWERING ° °Keep the surgical dressing until follow up.  The dressing is water proof, so you can shower without any extra covering.  IF THE DRESSING FALLS OFF or the wound gets wet inside, change the dressing with sterile gauze.  Please use good hand washing techniques before changing the dressing.  Do not use any lotions or creams on the incision until instructed by your surgeon.   ° °ACTIVITY ° °Increase activity slowly as tolerated, but follow the weight bearing instructions below.   °No driving for 6 weeks or until further direction given by your physician.  You cannot drive while taking narcotics.  °No lifting or carrying greater than 10 lbs. until further directed by your surgeon. °Avoid periods of inactivity such as sitting longer than an hour when not asleep. This helps prevent blood clots.  °You may return to work once you are authorized by your doctor.  ° ° ° °WEIGHT BEARING  ° °Weight bearing as tolerated with assist device (walker, cane,  etc) as directed, use it as long as suggested by your surgeon or therapist, typically at least 4-6 weeks. ° ° °EXERCISES ° °Results after joint replacement surgery are often greatly improved when you follow the exercise, range of motion and muscle strengthening exercises prescribed by your doctor. Safety measures are also important to protect the joint from further injury. Any time any of these exercises cause you to have increased pain or swelling, decrease what you are doing until you are comfortable again and then slowly increase them. If you have problems or questions, call your caregiver or physical therapist for advice.  ° °Rehabilitation is important following a joint replacement. After just a few days of immobilization, the muscles of the leg can become weakened and shrink (atrophy).  These exercises are designed to build up the tone and strength of the thigh and leg muscles and to improve motion. Often times heat used for twenty to thirty minutes before working out will loosen up your tissues and help with improving the range of motion but do not use heat for the first two weeks following surgery (sometimes heat can increase post-operative swelling).  ° °These exercises can be done on a training (exercise) mat, on the floor, on a table or on a bed. Use whatever works the best and is most comfortable for you.    Use music or television while you are exercising so that the exercises are a pleasant break in your   day. This will make your life better with the exercises acting as a break in your routine that you can look forward to.   Perform all exercises about fifteen times, three times per day or as directed.  You should exercise both the operative leg and the other leg as well.  Exercises include:   Quad Sets - Tighten up the muscle on the front of the thigh (Quad) and hold for 5-10 seconds.   Straight Leg Raises - With your knee straight (if you were given a brace, keep it on), lift the leg to 60  degrees, hold for 3 seconds, and slowly lower the leg.  Perform this exercise against resistance later as your leg gets stronger.  Leg Slides: Lying on your back, slowly slide your foot toward your buttocks, bending your knee up off the floor (only go as far as is comfortable). Then slowly slide your foot back down until your leg is flat on the floor again.  Angel Wings: Lying on your back spread your legs to the side as far apart as you can without causing discomfort.  Hamstring Strength:  Lying on your back, push your heel against the floor with your leg straight by tightening up the muscles of your buttocks.  Repeat, but this time bend your knee to a comfortable angle, and push your heel against the floor.  You may put a pillow under the heel to make it more comfortable if necessary.   A rehabilitation program following joint replacement surgery can speed recovery and prevent re-injury in the future due to weakened muscles. Contact your doctor or a physical therapist for more information on knee rehabilitation.    CONSTIPATION  Constipation is defined medically as fewer than three stools per week and severe constipation as less than one stool per week.  Even if you have a regular bowel pattern at home, your normal regimen is likely to be disrupted due to multiple reasons following surgery.  Combination of anesthesia, postoperative narcotics, change in appetite and fluid intake all can affect your bowels.   YOU MUST use at least one of the following options; they are listed in order of increasing strength to get the job done.  They are all available over the counter, and you may need to use some, POSSIBLY even all of these options:    Drink plenty of fluids (prune juice may be helpful) and high fiber foods Colace 100 mg by mouth twice a day  Senokot for constipation as directed and as needed Dulcolax (bisacodyl), take with full glass of water  Miralax (polyethylene glycol) once or twice a day as  needed.  If you have tried all these things and are unable to have a bowel movement in the first 3-4 days after surgery call either your surgeon or your primary doctor.    If you experience loose stools or diarrhea, hold the medications until you stool forms back up.  If your symptoms do not get better within 1 week or if they get worse, check with your doctor.  If you experience "the worst abdominal pain ever" or develop nausea or vomiting, please contact the office immediately for further recommendations for treatment.   ITCHING:  If you experience itching with your medications, try taking only a single pain pill, or even half a pain pill at a time.  You can also use Benadryl over the counter for itching or also to help with sleep.   TED HOSE STOCKINGS:  Use stockings on both  legs until for at least 2 weeks or as directed by physician office. They may be removed at night for sleeping.  MEDICATIONS:  See your medication summary on the After Visit Summary that nursing will review with you.  You may have some home medications which will be placed on hold until you complete the course of blood thinner medication.  It is important for you to complete the blood thinner medication as prescribed.  PRECAUTIONS:  If you experience chest pain or shortness of breath - call 911 immediately for transfer to the hospital emergency department.   If you develop a fever greater that 101 F, purulent drainage from wound, increased redness or drainage from wound, foul odor from the wound/dressing, or calf pain - CONTACT YOUR SURGEON.                                                   FOLLOW-UP APPOINTMENTS:  If you do not already have a post-op appointment, please call the office for an appointment to be seen by your surgeon.  Guidelines for how soon to be seen are listed in your After Visit Summary, but are typically between 1-4 weeks after surgery.  OTHER INSTRUCTIONS:   Knee Replacement:  Do not place pillow  under knee, focus on keeping the knee straight while resting. CPM instructions: 0-90 degrees, 2 hours in the morning, 2 hours in the afternoon, and 2 hours in the evening. Place foam block, curve side up under heel at all times except when in CPM or when walking.  DO NOT modify, tear, cut, or change the foam block in any way.  POST-OPERATIVE OPIOID TAPER INSTRUCTIONS: It is important to wean off of your opioid medication as soon as possible. If you do not need pain medication after your surgery it is ok to stop day one. Opioids include: Codeine, Hydrocodone(Norco, Vicodin), Oxycodone(Percocet, oxycontin) and hydromorphone amongst others.  Long term and even short term use of opiods can cause: Increased pain response Dependence Constipation Depression Respiratory depression And more.  Withdrawal symptoms can include Flu like symptoms Nausea, vomiting And more Techniques to manage these symptoms Hydrate well Eat regular healthy meals Stay active Use relaxation techniques(deep breathing, meditating, yoga) Do Not substitute Alcohol to help with tapering If you have been on opioids for less than two weeks and do not have pain than it is ok to stop all together.  Plan to wean off of opioids This plan should start within one week post op of your joint replacement. Maintain the same interval or time between taking each dose and first decrease the dose.  Cut the total daily intake of opioids by one tablet each day Next start to increase the time between doses. The last dose that should be eliminated is the evening dose.   MAKE SURE YOU:  Understand these instructions.  Get help right away if you are not doing well or get worse.    Thank you for letting us be a part of your medical care team.  It is a privilege we respect greatly.  We hope these instructions will help you stay on track for a fast and full recovery!

## 2021-05-21 NOTE — Anesthesia Procedure Notes (Signed)
Procedure Name: [place] ?Date/Time: 05/21/2021 7:37 AM ?Performed by: West Pugh, CRNA ?Pre-anesthesia Checklist: Patient identified, Emergency Drugs available, Suction available, Patient being monitored and Timeout performed ?Patient Re-evaluated:Patient Re-evaluated prior to induction ?Oxygen Delivery Method: Simple face mask ?Preoxygenation: Pre-oxygenation with 100% oxygen ?Placement Confirmation: positive ETCO2 ? ? ? ? ?

## 2021-05-21 NOTE — Transfer of Care (Signed)
Immediate Anesthesia Transfer of Care Note ? ?Patient: Nathan Rodriguez ? ?Procedure(s) Performed: TOTAL KNEE ARTHROPLASTY (Right: Knee) ? ?Patient Location: PACU ? ?Anesthesia Type:Spinal and MAC combined with regional for post-op pain ? ?Level of Consciousness: drowsy and patient cooperative ? ?Airway & Oxygen Therapy: Patient Spontanous Breathing and Patient connected to face mask oxygen ? ?Post-op Assessment: Report given to RN and Post -op Vital signs reviewed and stable ? ?Post vital signs: Reviewed and stable ? ?Last Vitals:  ?Vitals Value Taken Time  ?BP 104/76 05/21/21 0934  ?Temp    ?Pulse 71 05/21/21 0935  ?Resp 16 05/21/21 0935  ?SpO2 96 % 05/21/21 0935  ?Vitals shown include unvalidated device data. ? ?Last Pain:  ?Vitals:  ? 05/21/21 0603  ?TempSrc: Oral  ?PainSc:   ?   ? ?Patients Stated Pain Goal: 3 (05/21/21 0540) ? ?Complications: No notable events documented. ?

## 2021-05-22 DIAGNOSIS — M1711 Unilateral primary osteoarthritis, right knee: Secondary | ICD-10-CM | POA: Diagnosis not present

## 2021-05-22 LAB — CBC
HCT: 42.1 % (ref 39.0–52.0)
Hemoglobin: 14 g/dL (ref 13.0–17.0)
MCH: 32.3 pg (ref 26.0–34.0)
MCHC: 33.3 g/dL (ref 30.0–36.0)
MCV: 97.2 fL (ref 80.0–100.0)
Platelets: 247 10*3/uL (ref 150–400)
RBC: 4.33 MIL/uL (ref 4.22–5.81)
RDW: 12.7 % (ref 11.5–15.5)
WBC: 8.9 10*3/uL (ref 4.0–10.5)
nRBC: 0 % (ref 0.0–0.2)

## 2021-05-22 NOTE — Plan of Care (Signed)
Patient discharging

## 2021-05-22 NOTE — Plan of Care (Signed)
  Problem: Education: Goal: Knowledge of General Education information will improve Description: Including pain rating scale, medication(s)/side effects and non-pharmacologic comfort measures Outcome: Progressing   Problem: Activity: Goal: Risk for activity intolerance will decrease Outcome: Progressing   Problem: Pain Managment: Goal: General experience of comfort will improve Outcome: Progressing   

## 2021-05-22 NOTE — Progress Notes (Signed)
Subjective: ?1 Day Post-Op Procedure(s) (LRB): ?TOTAL KNEE ARTHROPLASTY (Right) ?Patient reports pain as mild.  Ambulated yesterday.  Foley out this morning.  Has already voided some.  Taking by mouth okay.  Denies chest pain or shortness of breath. ? ?Objective: ?Vital signs in last 24 hours: ?Temp:  [97.4 ?F (36.3 ?C)-98.2 ?F (36.8 ?C)] 98.1 ?F (36.7 ?C) (03/04 9169) ?Pulse Rate:  [56-98] 98 (03/04 0603) ?Resp:  [12-19] 16 (03/04 0603) ?BP: (104-141)/(75-89) 118/80 (03/04 0603) ?SpO2:  [87 %-100 %] 87 % (03/04 0603) ? ?Intake/Output from previous day: ?03/03 0701 - 03/04 0700 ?In: 4178.3 [P.O.:720; I.V.:3158.3; IV Piggyback:300] ?Out: 3600 [Urine:3550; Blood:50] ?Intake/Output this shift: ?No intake/output data recorded. ? ?Recent Labs  ?  05/22/21 ?0322  ?HGB 14.0  ? ?Recent Labs  ?  05/22/21 ?0322  ?WBC 8.9  ?RBC 4.33  ?HCT 42.1  ?PLT 247  ? ?No results for input(s): NA, K, CL, CO2, BUN, CREATININE, GLUCOSE, CALCIUM in the last 72 hours. ?No results for input(s): LABPT, INR in the last 72 hours. ?Right knee exam: ?Neurologically intact ?Neurovascular intact ?Sensation intact distally ?Intact pulses distally ?Dorsiflexion/Plantar flexion intact ?Incision: dressing C/D/I ?Compartment soft ? ? ?Assessment/Plan: ?1 Day Post-Op Procedure(s) (LRB): ?TOTAL KNEE ARTHROPLASTY (Right) ?Plan: ?Up with therapy ?Discharge home with home health ?Resume Xarelto 20 mg daily this evening.  This was his preop medication. ?Pain meds, muscle relaxers, and Colace have been sent to his pharmacy. ?Weight-bear as tolerated on right. ?Follow-up with Dr. Berenice Primas in 2 weeks. ? ? ? ? ?Erlene Senters ?05/22/2021, 8:43 AM ? ?

## 2021-05-22 NOTE — Progress Notes (Signed)
Physical Therapy Treatment ?Patient Details ?Name: Nathan Rodriguez ?MRN: [id number] ?DOB: 1945-10-29 ?Today's Date: 05/22/2021 ? ? 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 ?  ?PT Comments  ? ? Pt motivated and progressing well with mobility.  Pt up to ambulate increased distance in hall and with noted improvement in stability.  HEP initiated.  Will return for further therex and stair training.  Pt hopeful for dc home this date.   ?Recommendations for follow up therapy are one component of a multi-disciplinary discharge planning process, led by the attending physician.  Recommendations may be updated based on patient status, additional functional criteria and insurance authorization. ? ?Follow Up Recommendations ? Follow physician's recommendations for discharge plan and follow up therapies ?  ?  ?Assistance Recommended at Discharge Frequent or constant Supervision/Assistance  ?Patient can return home with the following A little help with walking and/or transfers;A little help with bathing/dressing/bathroom;Assist for transportation;Help with stairs or ramp for entrance;Assistance with cooking/housework ?  ?Equipment Recommendations ? None recommended by PT  ?  ?Recommendations for Other Services   ? ? ?  ?Precautions / Restrictions Precautions ?Precautions: Fall;Knee ?Restrictions ?Weight Bearing Restrictions: No ?RLE Weight Bearing: Weight bearing as tolerated ?Other Position/Activity Restrictions: WBAT  ?  ? ?Mobility ? Bed Mobility ?Overal bed mobility: Needs Assistance ?Bed Mobility: Supine to Sit ?  ?  ?Supine to sit: Min guard ?  ?  ?General bed mobility comments: min guard for R LE and use of bed rail ?  ? ?Transfers ?Overall transfer level: Needs assistance ?Equipment used: Rolling walker (2 wheels) ?Transfers: Sit to/from Stand ?Sit to Stand: Min assist, Min guard ?  ?  ?  ?  ?  ?General transfer comment: cues for LE management and use of  UEs to self assist.  Physical assist to bring wt up and fwd and to balance in initial standing ?  ? ?Ambulation/Gait ?Ambulation/Gait assistance: Min guard, Supervision ?Gait Distance (Feet): 150 Feet ?Assistive device: Rolling walker (2 wheels) ?Gait Pattern/deviations: Step-to pattern, Decreased step length - right, Decreased step length - left, Shuffle, Trunk flexed ?Gait velocity: decr ?  ?  ?General Gait Details: cues for sequence, posture and position from RW ? ? ?Stairs ?  ?  ?  ?  ?  ? ? ?Wheelchair Mobility ?  ? ?Modified Rankin (Stroke Patients Only) ?  ? ? ?  ?Balance Overall balance assessment: Needs assistance ?Sitting-balance support: No upper extremity supported, Feet supported ?Sitting balance-Leahy Scale: Good ?  ?  ?Standing balance support: No upper extremity supported ?Standing balance-Leahy Scale: Fair ?Standing balance comment: Pt able to don mask in standing using bil hands ?  ?  ?  ?  ?  ?  ?  ?  ?  ?  ?  ?  ? ?  ?Cognition Arousal/Alertness: Awake/alert ?Behavior During Therapy: [facility] for tasks assessed/performed ?Overall Cognitive Status: Within Functional Limits for tasks assessed ?  ?  ?  ?  ?  ?  ?  ?  ?  ?  ?  ?  ?  ?  ?  ?  ?  ?  ?  ? ?  ?Exercises Total Joint Exercises ?Ankle Circles/Pumps: AROM, Both, 15 reps, Supine ?Quad Sets: AROM, Both, 10 reps, Supine ?Heel Slides: AAROM, Right, 15 reps, Supine ?Straight Leg Raises: AAROM, AROM, Right, 15 reps, Supine ? ?  ?General Comments   ?  ?  ? ?  Pertinent Vitals/Pain Pain Assessment ?Pain Assessment: 0-10 ?Pain Score: 4  ?Pain Location: L knee ?Pain Descriptors / Indicators: Aching, Sore ?Pain Intervention(s): Limited activity within patient's tolerance, Monitored during session, Premedicated before session, Ice applied  ? ? ?Home Living   ?  ?  ?  ?  ?  ?  ?  ?  ?  ?   ?  ?Prior Function    ?  ?  ?   ? ?PT Goals (current goals can now be found in the care plan section) Acute Rehab PT Goals ?Patient Stated Goal: Regain IND ?PT Goal  Formulation: With patient ?Time For Goal Achievement: 05/28/21 ?Potential to Achieve Goals: Good ?Progress towards PT goals: Progressing toward goals ? ?  ?Frequency ? ? ? 7X/week ? ? ? ?  ?PT Plan Current plan remains appropriate  ? ? ?Co-evaluation   ?  ?  ?  ?  ? ?  ?AM-PAC PT "6 Clicks" Mobility   ?Outcome Measure ? Help needed turning from your back to your side while in a flat bed without using bedrails?: A Little ?Help needed moving from lying on your back to sitting on the side of a flat bed without using bedrails?: A Little ?Help needed moving to and from a bed to a chair (including a wheelchair)?: A Little ?Help needed standing up from a chair using your arms (e.g., wheelchair or bedside chair)?: A Little ?Help needed to walk in hospital room?: A Little ?Help needed climbing 3-5 steps with a railing? : A Lot ?6 Click Score: 17 ? ?  ?End of Session Equipment Utilized During Treatment: Gait belt ?Activity Tolerance: Patient tolerated treatment well ?Patient left: in chair;with call bell/phone within reach;with chair alarm set;with nursing/sitter in room ?Nurse Communication: Mobility status ?PT Visit Diagnosis: Unsteadiness on feet (R26.81);Difficulty in walking, not elsewhere classified (R26.2);Pain ?Pain - Right/Left: Right ?Pain - part of body: Knee ?  ? ? ?Time: 5809-9833 ?PT Time Calculation (min) (ACUTE ONLY): 28 min ? ?Charges:  $Gait Training: 8-22 mins ?$Therapeutic Exercise: 8-22 mins          ?          ? ?Debe Coder PT ?Acute Rehabilitation Services ?Pager [phone] ?Office [phone] ? ? ? ?Jenniferann Stuckert ?05/22/2021, 11:48 AM ? ?

## 2021-05-22 NOTE — Discharge Summary (Signed)
Patient ID: Nathan Rodriguez MRN: [id number] DOB/AGE: June 08, 1945 76 y.o.  Admit date: 05/21/2021 Discharge date: 05/22/2021  Admission Diagnoses:  Principal Problem:   Primary osteoarthritis of right knee   Discharge Diagnoses:  Same  Past Medical History:  Diagnosis Date   Anxiety    Arthritis    CAD (coronary artery disease)    Complication of anesthesia    hard time to wake up   Coronary artery disease    a. 2012  s/p prior DES x 2 to the LAD;  b. 05/2015 nl stress test [facility]);  c. 6/20217 Cath: LM nl, LAD patent stents, LCX/RCA nl, EF 55-65%.   DDD (degenerative disc disease)    Depression    DVT (deep venous thrombosis) (HCC)    Dyspnea on exertion    a. has been seen by pulmonology - reportedly told that everything nl.   Dysrhythmia    Afib   Factor V deficiency (HCC)    GERD (gastroesophageal reflux disease)    Hypercholesteremia    Hypertension    Hypertensive heart disease    Neuromuscular disorder (HCC)    neuropathy feet   Nocturia    Occipital headache    sight changes without migraines, for over 30  years   Pre-diabetes    Pulmonary embolism ([place])    Scoliosis    Sleep apnea    mild,  no cpap could not tolerate   Tinnitus     Surgeries: Procedure(s): Right TOTAL KNEE ARTHROPLASTY on 05/21/2021    Discharged Condition: Improved  Hospital Course: Nathan Rodriguez is an 76 y.o. male who was admitted 05/21/2021 for operative treatment ofPrimary osteoarthritis of right knee. Patient has severe unremitting pain that affects sleep, daily activities, and work/hobbies. After pre-op clearance the patient was taken to the operating room on 05/21/2021 and underwent  Procedure(s): Right TOTAL KNEE ARTHROPLASTY.    Patient was given perioperative antibiotics:  Anti-infectives (From admission, onward)    Start     Dose/Rate Route Frequency Ordered Stop   05/21/21 1400  ceFAZolin (ANCEF) IVPB 2g/100 mL premix        2 g 200 mL/hr over 30 Minutes Intravenous  Every 6 hours 05/21/21 1119 05/21/21 2056   05/21/21 0600  ceFAZolin (ANCEF) IVPB 2g/100 mL premix        2 g 200 mL/hr over 30 Minutes Intravenous On call to O.R. 05/21/21 8527 05/21/21 0813        Patient was given sequential compression devices, early ambulation, and chemoprophylaxis to prevent DVT.  He will resume his Xarelto 20 mg which she was taking preop on postop day #1.  He will take this once he gets home.  Patient benefited maximally from hospital stay and there were no complications.    Recent vital signs: Patient Vitals for the past 24 hrs:  BP Temp Temp src Pulse Resp SpO2  05/22/21 0603 118/80 98.1 F (36.7 C) Oral 98 16 (!) 87 %  05/22/21 0303 121/86 98.1 F (36.7 C) Oral 97 16 92 %  05/21/21 2243 125/88 98.2 F (36.8 C) Oral 97 18 93 %  05/21/21 1332 (!) 141/84 -- -- 61 18 99 %  05/21/21 1100 131/80 (!) 97.5 F (36.4 C) Oral 60 14 98 %  05/21/21 1045 132/89 -- -- -- 14 --  05/21/21 1030 131/86 -- -- 60 15 99 %  05/21/21 1015 115/83 -- -- (!) 56 17 100 %  05/21/21 1000 107/75 -- -- 68 12 97 %  05/21/21 0945 106/82 -- -- 72 13 95 %  05/21/21 0935 104/76 (!) 97.4 F (36.3 C) -- 72 19 98 %     Recent laboratory studies:  Recent Labs    05/22/21 0322  WBC 8.9  HGB 14.0  HCT 42.1  PLT 247     Discharge Medications:   Allergies as of 05/22/2021   No Known Allergies      Medication List     TAKE these medications    acetaminophen 325 MG tablet Commonly known as: TYLENOL Take 650-975 mg by mouth every 6 (six) hours as needed for moderate pain or headache.   aluminum hydroxide-magnesium carbonate 95-358 MG/15ML Susp Commonly known as: GAVISCON Take 15 mLs by mouth as needed for indigestion or heartburn.   BENEFIBER DRINK MIX PO Take 1 packet by mouth daily as needed (constipation).   CO Q 10 PO Take 1 tablet by mouth daily. With turmeric curcumin   docusate sodium 100 MG capsule Commonly known as: Colace Take 1 capsule (100 mg total) by  mouth 2 (two) times daily.   hydrocortisone valerate cream 0.2 % Commonly known as: WESTCORT Apply 1 application topically 2 (two) times daily as needed (irritation).   Magnesium 250 MG Tabs Take 250 mg by mouth daily.   metoprolol tartrate 25 MG tablet Commonly known as: LOPRESSOR Take 1 tablet (25 mg total) by mouth daily.   multivitamin with minerals Tabs tablet Take 1 tablet by mouth daily.   Omega-3 1000 MG Caps Take 1,000 mg by mouth daily.   omeprazole 20 MG capsule Commonly known as: PRILOSEC Take 20 mg by mouth daily.   OVER THE COUNTER MEDICATION Take 1 Dose by mouth daily. Super beets   OVER THE COUNTER MEDICATION Take 3 capsules by mouth daily. Balance of Nature Fruit   OVER THE COUNTER MEDICATION Take 3 capsules by mouth daily. Balance of Nature vegetables   oxyCODONE-acetaminophen 5-325 MG tablet Commonly known as: PERCOCET/ROXICET Take 1-2 tablets by mouth every 6 (six) hours as needed for severe pain.   Repatha SureClick 761 MG/ML Soaj Generic drug: Evolocumab INJECT 140 MG INTO THE SKIN EVERY 14 DAYS What changed:  how much to take how to take this when to take this additional instructions   rivaroxaban 20 MG Tabs tablet Commonly known as: XARELTO Take 1 tablet (20 mg total) by mouth daily with supper.   tiZANidine 2 MG tablet Commonly known as: ZANAFLEX Take 1 tablet (2 mg total) by mouth every 8 (eight) hours as needed for muscle spasms.               Durable Medical Equipment  (From admission, onward)           Start     Ordered   05/21/21 1120  DME Walker rolling  Once       Question:  Patient needs a walker to treat with the following condition  Answer:  Primary osteoarthritis of right knee   05/21/21 1119   05/21/21 1120  DME 3 n 1  Once        05/21/21 1119              Discharge Care Instructions  (From admission, onward)           Start     Ordered   05/22/21 0000  Weight bearing as tolerated        Question Answer Comment  Laterality right   Extremity Lower      05/22/21  0847            Diagnostic Studies: No results found.  Disposition: Discharge disposition: 01-Home or Self Care       Discharge Instructions     CPM   Complete by: As directed    Continuous passive motion machine (CPM):      Use the CPM from 0 to 60 for 8 hours per day.      You may increase by 5-10 per day.  You may break it up into 2 or 3 sessions per day.      Use CPM for 1-2 weeks or until you are told to stop.   Call MD / Call 911   Complete by: As directed    If you experience chest pain or shortness of breath, CALL 911 and be transported to the hospital emergency room.  If you develope a fever above 101 F, pus (white drainage) or increased drainage or redness at the wound, or calf pain, call your surgeon's office.   Constipation Prevention   Complete by: As directed    Drink plenty of fluids.  Prune juice may be helpful.  You may use a stool softener, such as Colace (over the counter) 100 mg twice a day.  Use MiraLax (over the counter) for constipation as needed.   Diet general   Complete by: As directed    Do not put a pillow under the knee. Place it under the heel.   Complete by: As directed    Increase activity slowly as tolerated   Complete by: As directed    Post-operative opioid taper instructions:   Complete by: As directed    POST-OPERATIVE OPIOID TAPER INSTRUCTIONS: It is important to wean off of your opioid medication as soon as possible. If you do not need pain medication after your surgery it is ok to stop day one. Opioids include: Codeine, Hydrocodone(Norco, Vicodin), Oxycodone(Percocet, oxycontin) and hydromorphone amongst others.  Long term and even short term use of opiods can cause: Increased pain response Dependence Constipation Depression Respiratory depression And more.  Withdrawal symptoms can include Flu like symptoms Nausea, vomiting And  more Techniques to manage these symptoms Hydrate well Eat regular healthy meals Stay active Use relaxation techniques(deep breathing, meditating, yoga) Do Not substitute Alcohol to help with tapering If you have been on opioids for less than two weeks and do not have pain than it is ok to stop all together.  Plan to wean off of opioids This plan should start within one week post op of your joint replacement. Maintain the same interval or time between taking each dose and first decrease the dose.  Cut the total daily intake of opioids by one tablet each day Next start to increase the time between doses. The last dose that should be eliminated is the evening dose.      Weight bearing as tolerated   Complete by: As directed    Laterality: right   Extremity: Lower        Follow-up Information     Dorna Leitz, MD. Schedule an appointment as soon as possible for a visit in 2 week(s).   Specialty: Orthopedic Surgery Contact information: [place] [address] [phone]                  Signed: Erlene Senters 05/22/2021, 8:47 AM

## 2021-05-22 NOTE — TOC Transition Note (Signed)
Transition of Care (TOC) - CM/SW Discharge Note ? ? ?Patient Details  ?Name: Nathan Rodriguez ?MRN: [id number] ?Date of Birth: 1946-01-16 ? ?Transition of Care (TOC) CM/SW Contact:  ?Ross Ludwig, LCSW ?Phone Number: ?05/22/2021, 11:54 AM ? ? ?Clinical Narrative:    ?CSW spoke to patient to see if he had a preference for [facility] agency, he did not.  CSW spoke to Amy at [facility], and she can accept patient.  Patient will be going home with home health PT through [facility].  CSW signing off please reconsult with any other social work needs, home health agency has been notified of planned discharge.  Patient was asked if he needed any equipment, he said no, he already has it at home. ? ? ? ?Final next level of care: [place] ?Barriers to Discharge: Barriers Resolved ? ? ?Patient Goals and CMS Choice ?Patient states their goals for this hospitalization and ongoing recovery are:: To return back home with home health PT. ?CMS Medicare.gov Compare Post Acute Care list provided to:: Patient ?Choice offered to / list presented to : Patient ? ?Discharge Placement ?  ?           ?  ?  ?  ?  ? ?Discharge Plan and Services ?  ?  ?           ?  ?  ?  ?  ?  ?HH Arranged: PT ?[place] Agency: [facility] ?Date HH Agency Contacted: 05/22/21 ?Time [place]: 9211 ?Representative spoke with at [place]: [place] ? ?Social Determinants of Health (SDOH) Interventions ?  ? ? ?Readmission Risk Interventions ?No flowsheet data found. ? ? ? ? ?

## 2021-05-22 NOTE — Progress Notes (Signed)
Physical Therapy Treatment ?Patient Details ?Name: Nathan Rodriguez ?MRN: [id number] ?DOB: [birth year] ?Today's Date: 05/22/2021 ? ? 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 ?  ?PT Comments  ? ? Pt continues very cooperative and progressing steadily with mobility.  Pt up to ambulate in hall, negotiated stairs, and performed HEP with written instruction provided and reviewed.  Spouse present for stair training.   ?Recommendations for follow up therapy are one component of a multi-disciplinary discharge planning process, led by the attending physician.  Recommendations may be updated based on patient status, additional functional criteria and insurance authorization. ? ?Follow Up Recommendations ? Follow physician's recommendations for discharge plan and follow up therapies ?  ?  ?Assistance Recommended at Discharge Frequent or constant Supervision/Assistance  ?Patient can return home with the following A little help with walking and/or transfers;A little help with bathing/dressing/bathroom;Assist for transportation;Help with stairs or ramp for entrance;Assistance with cooking/housework ?  ?Equipment Recommendations ? None recommended by PT  ?  ?Recommendations for Other Services   ? ? ?  ?Precautions / Restrictions Precautions ?Precautions: Fall;Knee ?Restrictions ?Weight Bearing Restrictions: No ?RLE Weight Bearing: Weight bearing as tolerated ?Other Position/Activity Restrictions: WBAT  ?  ? ?Mobility ? Bed Mobility ?Overal bed mobility: Needs Assistance ?Bed Mobility: Supine to Sit ?  ?  ?Supine to sit: Min guard ?  ?  ?General bed mobility comments: Pt up in chair and requests back to same ?  ? ?Transfers ?Overall transfer level: Needs assistance ?Equipment used: Rolling walker (2 wheels) ?Transfers: Sit to/from Stand ?Sit to Stand: Min guard ?  ?  ?  ?  ?  ?General transfer comment: Steady assist with cues for LE management and use of UEs to self  assist. ?  ? ?Ambulation/Gait ?Ambulation/Gait assistance: Min guard, Supervision ?Gait Distance (Feet): 150 Feet ?Assistive device: Rolling walker (2 wheels) ?Gait Pattern/deviations: Step-to pattern, Decreased step length - right, Decreased step length - left, Shuffle, Trunk flexed ?Gait velocity: decr ?  ?  ?General Gait Details: min cues for sequence, posture and position from RW ? ? ?Stairs ?Stairs: Yes ?Stairs assistance: Min assist ?Stair Management: No rails, Step to pattern, Backwards, Forwards, With walker ?Number of Stairs: 10 ?General stair comments: single step 5x fwd and 5x bkwd; cues for sequence and foot/RW placement ? ? ?Wheelchair Mobility ?  ? ?Modified Rankin (Stroke Patients Only) ?  ? ? ?  ?Balance Overall balance assessment: Needs assistance ?Sitting-balance support: No upper extremity supported, Feet supported ?Sitting balance-Leahy Scale: Good ?  ?  ?Standing balance support: No upper extremity supported ?Standing balance-Leahy Scale: Fair ?Standing balance comment: Pt able to don mask in standing using bil hands ?  ?  ?  ?  ?  ?  ?  ?  ?  ?  ?  ?  ? ?  ?Cognition Arousal/Alertness: Awake/alert ?Behavior During Therapy: [facility] for tasks assessed/performed ?Overall Cognitive Status: Within Functional Limits for tasks assessed ?  ?  ?  ?  ?  ?  ?  ?  ?  ?  ?  ?  ?  ?  ?  ?  ?  ?  ?  ? ?  ?Exercises Total Joint Exercises ?Ankle Circles/Pumps: AROM, Both, 15 reps, Supine ?Quad Sets: AROM, Both, 10 reps, Supine ?Heel Slides: AAROM, Right, Supine, 10 reps ?Hip ABduction/ADduction: AAROM, AROM, Right, 10 reps, Supine ?Straight Leg Raises: AROM, Right, Supine, 10 reps ?  Long Arc Quad: AAROM, Right, 10 reps, Seated ? ?  ?General Comments   ?  ?  ? ?Pertinent Vitals/Pain Pain Assessment ?Pain Assessment: 0-10 ?Pain Score: 4  ?Pain Location: L knee ?Pain Descriptors / Indicators: Aching, Sore ?Pain Intervention(s): Limited activity within patient's tolerance, Monitored during session, Premedicated before  session, Ice applied  ? ? ?Home Living   ?  ?  ?  ?  ?  ?  ?  ?  ?  ?   ?  ?Prior Function    ?  ?  ?   ? ?PT Goals (current goals can now be found in the care plan section) Acute Rehab PT Goals ?Patient Stated Goal: Regain IND ?PT Goal Formulation: With patient ?Time For Goal Achievement: 05/28/21 ?Potential to Achieve Goals: Good ?Progress towards PT goals: Progressing toward goals ? ?  ?Frequency ? ? ? 7X/week ? ? ? ?  ?PT Plan Current plan remains appropriate  ? ? ?Co-evaluation   ?  ?  ?  ?  ? ?  ?AM-PAC PT "6 Clicks" Mobility   ?Outcome Measure ? Help needed turning from your back to your side while in a flat bed without using bedrails?: A Little ?Help needed moving from lying on your back to sitting on the side of a flat bed without using bedrails?: A Little ?Help needed moving to and from a bed to a chair (including a wheelchair)?: A Little ?Help needed standing up from a chair using your arms (e.g., wheelchair or bedside chair)?: A Little ?Help needed to walk in hospital room?: A Little ?Help needed climbing 3-5 steps with a railing? : A Little ?6 Click Score: 18 ? ?  ?End of Session Equipment Utilized During Treatment: Gait belt ?Activity Tolerance: Patient tolerated treatment well ?Patient left: in chair;with call bell/phone within reach;with family/visitor present ?Nurse Communication: Mobility status ?PT Visit Diagnosis: Unsteadiness on feet (R26.81);Difficulty in walking, not elsewhere classified (R26.2);Pain ?Pain - Right/Left: Right ?Pain - part of body: Knee ?  ? ? ?Time: 1220-1320 ?PT Time Calculation (min) (ACUTE ONLY): 60 min ? ?Charges:  $Gait Training: 8-22 mins ?$Therapeutic Exercise: 8-22 mins ?$Therapeutic Activity: 8-22 mins          ?          ? ?Debe Coder PT ?Acute Rehabilitation Services ?Pager [phone] ?Office [phone] ? ? ? ?Andres Bantz ?05/22/2021, 2:10 PM ? ?

## 2021-05-22 NOTE — Plan of Care (Signed)
  Problem: Coping: Goal: Level of anxiety will decrease Outcome: Progressing   Problem: Pain Managment: Goal: General experience of comfort will improve Outcome: Progressing   Problem: Safety: Goal: Ability to remain free from injury will improve Outcome: Progressing   

## 2021-05-24 ENCOUNTER — Encounter (HOSPITAL_COMMUNITY): Payer: Self-pay | Admitting: Orthopedic Surgery

## 2021-05-25 MED ORDER — BUPIVACAINE IN DEXTROSE 0.75-8.25 % IT SOLN
INTRATHECAL | Status: DC | PRN
  Administered 2021-05-21: 1.8 mL via INTRATHECAL

## 2021-05-25 NOTE — Anesthesia Procedure Notes (Signed)
Spinal ? ?Patient location during procedure: OR ?Start time: 05/21/2021 7:40 AM ?End time: 05/21/2021 7:43 AM ?Reason for block: surgical anesthesia ?Staffing ?Performed: anesthesiologist  ?Anesthesiologist: Albertha Ghee, MD ?Preanesthetic Checklist ?Completed: patient identified, IV checked, risks and benefits discussed, surgical consent, monitors and equipment checked, pre-op evaluation and timeout performed ?Spinal Block ?Patient position: sitting ?Prep: DuraPrep ?Patient monitoring: cardiac monitor, continuous pulse ox and blood pressure ?Approach: midline ?Injection technique: single-shot ?Needle ?Needle type: Pencan  ?Needle gauge: 24 G ?Needle length: 9 cm ?Assessment ?Sensory level: T10 ?Events: CSF return ?Additional Notes ?Functioning IV was confirmed and monitors were applied. Sterile prep and drape, including hand hygiene and sterile gloves were used. The patient was positioned and the spine was prepped. The skin was anesthetized with lidocaine.  Free flow of clear CSF was obtained prior to injecting local anesthetic into the CSF.  The spinal needle aspirated freely following injection.  The needle was carefully withdrawn.  The patient tolerated the procedure well.  ? ? ? ?

## 2021-05-25 NOTE — Addendum Note (Signed)
Addendum  created 05/25/21 1429 by Albertha Ghee, MD  ? Child order released for a procedure order, Clinical Note Signed, Intraprocedure Blocks edited, Intraprocedure Meds edited, SmartForm saved  ?  ?

## 2021-05-27 ENCOUNTER — Emergency Department (HOSPITAL_COMMUNITY): Payer: Medicare Other

## 2021-05-27 ENCOUNTER — Encounter (HOSPITAL_COMMUNITY): Payer: Self-pay | Admitting: Emergency Medicine

## 2021-05-27 ENCOUNTER — Inpatient Hospital Stay (HOSPITAL_COMMUNITY): Payer: Medicare Other

## 2021-05-27 ENCOUNTER — Inpatient Hospital Stay (HOSPITAL_COMMUNITY)
Admission: EM | Admit: 2021-05-27 | Discharge: 2021-05-29 | DRG: 176 | Disposition: A | Discharge status: Home / Self Care | Payer: Medicare Other | Attending: Internal Medicine | Admitting: Internal Medicine

## 2021-05-27 ENCOUNTER — Other Ambulatory Visit: Payer: Self-pay

## 2021-05-27 DIAGNOSIS — Z8249 Family history of ischemic heart disease and other diseases of the circulatory system: Secondary | ICD-10-CM

## 2021-05-27 DIAGNOSIS — K5900 Constipation, unspecified: Secondary | ICD-10-CM | POA: Diagnosis present

## 2021-05-27 DIAGNOSIS — Z79899 Other long term (current) drug therapy: Secondary | ICD-10-CM

## 2021-05-27 DIAGNOSIS — I82441 Acute embolism and thrombosis of right tibial vein: Secondary | ICD-10-CM | POA: Diagnosis present

## 2021-05-27 DIAGNOSIS — G4730 Sleep apnea, unspecified: Secondary | ICD-10-CM | POA: Diagnosis present

## 2021-05-27 DIAGNOSIS — Z87891 Personal history of nicotine dependence: Secondary | ICD-10-CM

## 2021-05-27 DIAGNOSIS — T1490XA Injury, unspecified, initial encounter: Secondary | ICD-10-CM | POA: Diagnosis present

## 2021-05-27 DIAGNOSIS — N400 Enlarged prostate without lower urinary tract symptoms: Secondary | ICD-10-CM | POA: Diagnosis present

## 2021-05-27 DIAGNOSIS — Z20822 Contact with and (suspected) exposure to covid-19: Secondary | ICD-10-CM | POA: Diagnosis present

## 2021-05-27 DIAGNOSIS — S0101XA Laceration without foreign body of scalp, initial encounter: Secondary | ICD-10-CM | POA: Diagnosis present

## 2021-05-27 DIAGNOSIS — Z955 Presence of coronary angioplasty implant and graft: Secondary | ICD-10-CM

## 2021-05-27 DIAGNOSIS — D6851 Activated protein C resistance: Secondary | ICD-10-CM | POA: Diagnosis present

## 2021-05-27 DIAGNOSIS — R55 Syncope and collapse: Secondary | ICD-10-CM

## 2021-05-27 DIAGNOSIS — I2699 Other pulmonary embolism without acute cor pulmonale: Secondary | ICD-10-CM

## 2021-05-27 DIAGNOSIS — Z833 Family history of diabetes mellitus: Secondary | ICD-10-CM

## 2021-05-27 DIAGNOSIS — I10 Essential (primary) hypertension: Secondary | ICD-10-CM | POA: Diagnosis present

## 2021-05-27 DIAGNOSIS — Z86718 Personal history of other venous thrombosis and embolism: Secondary | ICD-10-CM

## 2021-05-27 DIAGNOSIS — Z96611 Presence of right artificial shoulder joint: Secondary | ICD-10-CM | POA: Diagnosis present

## 2021-05-27 DIAGNOSIS — W1830XA Fall on same level, unspecified, initial encounter: Secondary | ICD-10-CM | POA: Diagnosis present

## 2021-05-27 DIAGNOSIS — Z7901 Long term (current) use of anticoagulants: Secondary | ICD-10-CM

## 2021-05-27 DIAGNOSIS — Z96651 Presence of right artificial knee joint: Secondary | ICD-10-CM | POA: Diagnosis present

## 2021-05-27 DIAGNOSIS — F32A Depression, unspecified: Secondary | ICD-10-CM | POA: Diagnosis present

## 2021-05-27 DIAGNOSIS — I2510 Atherosclerotic heart disease of native coronary artery without angina pectoris: Secondary | ICD-10-CM | POA: Diagnosis present

## 2021-05-27 DIAGNOSIS — M79604 Pain in right leg: Secondary | ICD-10-CM

## 2021-05-27 DIAGNOSIS — F419 Anxiety disorder, unspecified: Secondary | ICD-10-CM | POA: Diagnosis present

## 2021-05-27 DIAGNOSIS — E7800 Pure hypercholesterolemia, unspecified: Secondary | ICD-10-CM | POA: Diagnosis present

## 2021-05-27 DIAGNOSIS — I82461 Acute embolism and thrombosis of right calf muscular vein: Secondary | ICD-10-CM | POA: Diagnosis present

## 2021-05-27 DIAGNOSIS — I82431 Acute embolism and thrombosis of right popliteal vein: Secondary | ICD-10-CM | POA: Diagnosis present

## 2021-05-27 DIAGNOSIS — Z86711 Personal history of pulmonary embolism: Secondary | ICD-10-CM

## 2021-05-27 DIAGNOSIS — K219 Gastro-esophageal reflux disease without esophagitis: Secondary | ICD-10-CM | POA: Diagnosis present

## 2021-05-27 DIAGNOSIS — M7989 Other specified soft tissue disorders: Secondary | ICD-10-CM

## 2021-05-27 DIAGNOSIS — I82451 Acute embolism and thrombosis of right peroneal vein: Secondary | ICD-10-CM | POA: Diagnosis present

## 2021-05-27 DIAGNOSIS — N179 Acute kidney failure, unspecified: Secondary | ICD-10-CM | POA: Diagnosis present

## 2021-05-27 DIAGNOSIS — Z9049 Acquired absence of other specified parts of digestive tract: Secondary | ICD-10-CM

## 2021-05-27 DIAGNOSIS — R0602 Shortness of breath: Secondary | ICD-10-CM

## 2021-05-27 DIAGNOSIS — I4891 Unspecified atrial fibrillation: Secondary | ICD-10-CM | POA: Diagnosis present

## 2021-05-27 DIAGNOSIS — R296 Repeated falls: Secondary | ICD-10-CM | POA: Diagnosis present

## 2021-05-27 DIAGNOSIS — Z23 Encounter for immunization: Secondary | ICD-10-CM | POA: Diagnosis present

## 2021-05-27 DIAGNOSIS — J3801 Paralysis of vocal cords and larynx, unilateral: Secondary | ICD-10-CM | POA: Diagnosis present

## 2021-05-27 LAB — I-STAT CHEM 8, ED
BUN: 14 mg/dL (ref 8–23)
Calcium, Ion: 0.96 mmol/L — ABNORMAL LOW (ref 1.15–1.40)
Chloride: 106 mmol/L (ref 98–111)
Creatinine, Ser: 0.7 mg/dL (ref 0.61–1.24)
Glucose, Bld: 114 mg/dL — ABNORMAL HIGH (ref 70–99)
HCT: 39 % (ref 39.0–52.0)
Hemoglobin: 13.3 g/dL (ref 13.0–17.0)
Potassium: 3.2 mmol/L — ABNORMAL LOW (ref 3.5–5.1)
Sodium: 139 mmol/L (ref 135–145)
TCO2: 20 mmol/L — ABNORMAL LOW (ref 22–32)

## 2021-05-27 LAB — COMPREHENSIVE METABOLIC PANEL
ALT: 19 U/L (ref 0–44)
AST: 27 U/L (ref 15–41)
Albumin: 2.9 g/dL — ABNORMAL LOW (ref 3.5–5.0)
Alkaline Phosphatase: 52 U/L (ref 38–126)
Anion gap: 11 (ref 5–15)
BUN: 16 mg/dL (ref 8–23)
CO2: 22 mmol/L (ref 22–32)
Calcium: 8.9 mg/dL (ref 8.9–10.3)
Chloride: 102 mmol/L (ref 98–111)
Creatinine, Ser: 1.02 mg/dL (ref 0.61–1.24)
GFR, Estimated: >60 mL/min (ref >60)
Glucose, Bld: 129 mg/dL — ABNORMAL HIGH (ref 70–99)
Potassium: 3.8 mmol/L (ref 3.5–5.1)
Sodium: 135 mmol/L (ref 135–145)
Total Bilirubin: 1.2 mg/dL (ref 0.3–1.2)
Total Protein: 6.2 g/dL — ABNORMAL LOW (ref 6.5–8.1)

## 2021-05-27 LAB — ECHOCARDIOGRAM COMPLETE
AV Mean grad: 3 mmHg
AV Peak grad: 3.3 mmHg
Ao pk vel: 0.91 m/s
Height: 70 in
Weight: 2624 oz

## 2021-05-27 LAB — CBC
HCT: 38.9 % — ABNORMAL LOW (ref 39.0–52.0)
Hemoglobin: 13 g/dL (ref 13.0–17.0)
MCH: 32.1 pg (ref 26.0–34.0)
MCHC: 33.4 g/dL (ref 30.0–36.0)
MCV: 96 fL (ref 80.0–100.0)
Platelets: 266 10*3/uL (ref 150–400)
RBC: 4.05 MIL/uL — ABNORMAL LOW (ref 4.22–5.81)
RDW: 12 % (ref 11.5–15.5)
WBC: 9.3 10*3/uL (ref 4.0–10.5)
nRBC: 0 % (ref 0.0–0.2)

## 2021-05-27 LAB — URINALYSIS, ROUTINE W REFLEX MICROSCOPIC
Bilirubin Urine: NEGATIVE
Glucose, UA: NEGATIVE mg/dL
Ketones, ur: 80 mg/dL — AB
Leukocytes,Ua: NEGATIVE
Nitrite: NEGATIVE
Protein, ur: NEGATIVE mg/dL
Specific Gravity, Urine: 1.036 — ABNORMAL HIGH (ref 1.005–1.030)
pH: 6 (ref 5.0–8.0)

## 2021-05-27 LAB — RESP PANEL BY RT-PCR (FLU A&B, COVID) ARPGX2
Influenza A by PCR: NEGATIVE
Influenza B by PCR: NEGATIVE
SARS Coronavirus 2 by RT PCR: NEGATIVE

## 2021-05-27 LAB — SAMPLE TO BLOOD BANK

## 2021-05-27 LAB — LACTIC ACID, PLASMA: Lactic Acid, Venous: 2 mmol/L (ref 0.5–1.9)

## 2021-05-27 LAB — PROTIME-INR
INR: 2.2 — ABNORMAL HIGH (ref 0.8–1.2)
Prothrombin Time: 24.7 seconds — ABNORMAL HIGH (ref 11.4–15.2)

## 2021-05-27 LAB — ETHANOL: Alcohol, Ethyl (B): <10 mg/dL (ref <10)

## 2021-05-27 MED ORDER — TETANUS-DIPHTH-ACELL PERTUSSIS 5-2.5-18.5 LF-MCG/0.5 IM SUSY
0.5000 mL | PREFILLED_SYRINGE | Freq: Once | INTRAMUSCULAR | Status: AC
  Administered 2021-05-27: 17:00:00 0.5 mL via INTRAMUSCULAR
  Filled 2021-05-27: qty 0.5

## 2021-05-27 MED ORDER — ACETAMINOPHEN 325 MG PO TABS
650.0000 mg | ORAL_TABLET | Freq: Four times a day (QID) | ORAL | Status: DC | PRN
  Administered 2021-05-27 – 2021-05-29 (×5): 650 mg via ORAL
  Filled 2021-05-27 (×5): qty 2

## 2021-05-27 MED ORDER — METOPROLOL TARTRATE 25 MG PO TABS
25.0000 mg | ORAL_TABLET | Freq: Every day | ORAL | Status: DC
Start: 2021-05-27 — End: 2021-05-29
  Administered 2021-05-27 – 2021-05-29 (×3): 25 mg via ORAL
  Filled 2021-05-27 (×3): qty 1

## 2021-05-27 MED ORDER — TIZANIDINE HCL 4 MG PO TABS: 2.0000 mg | ORAL_TABLET | Freq: Three times a day (TID) | ORAL | Status: DC | PRN

## 2021-05-27 MED ORDER — HEPARIN BOLUS VIA INFUSION
3000.0000 [IU] | Freq: Once | INTRAVENOUS | Status: AC
  Administered 2021-05-27: 16:00:00 3000 [IU] via INTRAVENOUS
  Filled 2021-05-27: qty 3000

## 2021-05-27 MED ORDER — IOHEXOL 350 MG/ML SOLN
50.0000 mL | Freq: Once | INTRAVENOUS | Status: AC | PRN
  Administered 2021-05-27: 11:00:00 50 mL via INTRAVENOUS

## 2021-05-27 MED ORDER — PERFLUTREN LIPID MICROSPHERE
1.0000 mL | INTRAVENOUS | Status: AC | PRN
  Administered 2021-05-27: 17:00:00 2 mL via INTRAVENOUS
  Filled 2021-05-27: qty 10

## 2021-05-27 MED ORDER — ACETAMINOPHEN 650 MG RE SUPP: 650.0000 mg | Freq: Four times a day (QID) | RECTAL | Status: DC | PRN

## 2021-05-27 MED ORDER — HEPARIN (PORCINE) 25000 UT/250ML-% IV SOLN
1350.0000 [IU]/h | INTRAVENOUS | Status: DC
Start: 2021-05-27 — End: 2021-05-28
  Administered 2021-05-27: 16:00:00 1250 [IU]/h via INTRAVENOUS
  Administered 2021-05-28: 1350 [IU]/h via INTRAVENOUS
  Filled 2021-05-27 (×2): qty 250

## 2021-05-27 MED ORDER — ONDANSETRON HCL 4 MG/2ML IJ SOLN: 4.0000 mg | Freq: Four times a day (QID) | INTRAMUSCULAR | Status: DC | PRN

## 2021-05-27 MED ORDER — ONDANSETRON HCL 4 MG PO TABS: 4.0000 mg | ORAL_TABLET | Freq: Four times a day (QID) | ORAL | Status: DC | PRN

## 2021-05-27 MED ORDER — DOCUSATE SODIUM 100 MG PO CAPS
100.0000 mg | ORAL_CAPSULE | Freq: Two times a day (BID) | ORAL | Status: DC
  Administered 2021-05-27 – 2021-05-29 (×4): 100 mg via ORAL
  Filled 2021-05-27 (×4): qty 1

## 2021-05-27 MED ORDER — POLYETHYLENE GLYCOL 3350 17 G PO PACK: 17.0000 g | PACK | Freq: Every day | ORAL | Status: DC | PRN

## 2021-05-27 MED ORDER — PANTOPRAZOLE SODIUM 40 MG PO TBEC
40.0000 mg | DELAYED_RELEASE_TABLET | Freq: Every day | ORAL | Status: DC
Start: 2021-05-27 — End: 2021-05-29
  Administered 2021-05-27 – 2021-05-29 (×3): 40 mg via ORAL
  Filled 2021-05-27 (×3): qty 1
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
## 2021-05-27 NOTE — Progress Notes (Signed)
ANTICOAGULATION CONSULT NOTE - Initial Consult ? ?Pharmacy Consult for heparin ?Indication: VTE treatment ? ?No Known Allergies ? ?Patient Measurements: ?Height: '5\' 10"'$  (177.8 cm) ?Weight: 74.4 kg (164 lb) ?IBW/kg (Calculated) : 73 ?Heparin Dosing Weight: 74.4 kg ? ?Vital Signs: ?Temp: 99.1 ?F (37.3 ?C) (03/09 1103) ?Temp Source: Rectal (03/09 1103) ?BP: 148/93 (03/09 1445) ?Pulse Rate: 98 (03/09 1445) ? ?Labs: ?Recent Labs  ?  05/27/21 ?1037 05/27/21 ?1044  ?HGB 13.3 13.0  ?HCT 39.0 38.9*  ?PLT  --  266  ?LABPROT  --  24.7*  ?INR  --  2.2*  ?CREATININE 0.70 1.02  ? ? ?Estimated Creatinine Clearance: 62.6 mL/min (by C-G formula based on SCr of 1.02 mg/dL). ? ? ?Medical History: ?Past Medical History:  ?Diagnosis Date  ? Anxiety   ? Arthritis   ? CAD (coronary artery disease)   ? Complication of anesthesia   ? hard time to wake up  ? Coronary artery disease   ? a. 2012  s/p prior DES x 2 to the LAD;  b. 05/2015 nl stress test [facility]);  c. 6/20217 Cath: LM nl, LAD patent stents, LCX/RCA nl, EF 55-65%.  ? DDD (degenerative disc disease)   ? Depression   ? DVT (deep venous thrombosis) ([place])   ? Dyspnea on exertion   ? a. has been seen by pulmonology - reportedly told that everything nl.  ? Dysrhythmia   ? Afib  ? Factor V deficiency ([place])   ? GERD (gastroesophageal reflux disease)   ? Hypercholesteremia   ? Hypertension   ? Hypertensive heart disease   ? Neuromuscular disorder ([place])   ? neuropathy feet  ? Nocturia   ? Occipital headache   ? sight changes without migraines, for over 30  years  ? Pre-diabetes   ? Pulmonary embolism ([place])   ? Scoliosis   ? Sleep apnea   ? mild,  no cpap could not tolerate  ? Tinnitus   ? ? ?Medications:  ?(Not in a hospital admission)  ?Scheduled:  ? docusate sodium  100 mg Oral BID  ? ?Infusions:  ? ?Assessment: ?Patient is post op day 6 after right TKA. Patient admitted for fall on blood thinners. CT head was negative. CT Chest showed a concerning area for right lower lobe PE.  Patient was taking Xarelto daily for history of DVT, PE, and factor V Leiden prior to admission. Patient reported missing PM dose of Xarelto on 05/26/21. Pharmacy consulted to transition from Xarelto to heparin for treatment of VTE while being managed inpatient. Doppler exam from 3/9 is pending. ? ?CrCl is 62.6 mL/min and CBC is stable. Spoke with MD and confirmed that bolus is warranted.  ? ? ?Goal of Therapy:  ?Heparin level 0.3-0.7 units/ml ?Monitor platelets by anticoagulation protocol: Yes ?  ?Plan:  ?Give 3000 unit bolus ?Start heparin at 1250 units/hour ?8 hour heparin level ?Daily heparin level and CBC ? ?Thank you for allowing pharmacy to participate in this patient's care. ? ?Reatha Harps, PharmD ?PGY1 Pharmacy Resident ?05/27/2021 3:28 PM ?Check [email or web address] for unit specific pharmacy number ? ? ? ?
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
## 2021-05-27 NOTE — ED Provider Notes (Signed)
[facility]PART[facility] Provider Note   CSN: [id number] Arrival date & time: 05/27/21  1015     History  No chief complaint on file.   Nathan Rodriguez is a 76 y.o. male.  The history is provided by the patient, medical records and the EMS personnel. No language interpreter was used.  Trauma Mechanism of injury: Fall Injury location: head/neck   Current symptoms:      Associated symptoms:            Reports chest pain.            Denies abdominal pain, back pain, headache, nausea, neck pain and vomiting.  Shortness of Breath Severity:  Severe Onset quality:  Gradual Timing:  Constant Progression:  Unchanged Chronicity:  New Context: not URI   Relieved by:  Nothing Worsened by:  Nothing Ineffective treatments:  None tried Associated symptoms: chest pain and fever   Associated symptoms: no abdominal pain, no claudication, no cough, no diaphoresis, no headaches, no neck pain, no rash, no sputum production, no vomiting and no wheezing   Risk factors: hx of PE/DVT and recent surgery       Home Medications Prior to Admission medications   Medication Sig Start Date End Date Taking? Authorizing Provider  acetaminophen (TYLENOL) 325 MG tablet Take 650-975 mg by mouth every 6 (six) hours as needed for moderate pain or headache.    [provider]  aluminum hydroxide-magnesium carbonate (GAVISCON) 95-358 MG/15ML SUSP Take 15 mLs by mouth as needed for indigestion or heartburn.    [provider]  Coenzyme Q10 (CO Q 10 PO) Take 1 tablet by mouth daily. With turmeric curcumin    [provider]  docusate sodium (COLACE) 100 MG capsule Take 1 capsule (100 mg total) by mouth 2 (two) times daily. 05/21/21   Gary Fleet, PA-C  Evolocumab (REPATHA SURECLICK) 628 MG/ML SOAJ INJECT 140 MG INTO THE SKIN EVERY 14 DAYS Patient taking differently: Inject 140 mg as directed every 14 (fourteen) days. 06/15/20   Lorretta Harp, MD   hydrocortisone valerate cream (WESTCORT) 0.2 % Apply 1 application topically 2 (two) times daily as needed (irritation).    [provider]  Magnesium 250 MG TABS Take 250 mg by mouth daily.    [provider]  metoprolol tartrate (LOPRESSOR) 25 MG tablet Take 1 tablet (25 mg total) by mouth daily. 03/24/21   Lorretta Harp, MD  Multiple Vitamin (MULTIVITAMIN WITH MINERALS) TABS tablet Take 1 tablet by mouth daily.    [provider]  Omega-3 1000 MG CAPS Take 1,000 mg by mouth daily.    [provider]  omeprazole (PRILOSEC) 20 MG capsule Take 20 mg by mouth daily. 08/07/19   [provider]  OVER THE COUNTER MEDICATION Take 1 Dose by mouth daily. Super beets    [provider]  OVER THE COUNTER MEDICATION Take 3 capsules by mouth daily. Balance of [employer], Historical, MD  OVER THE COUNTER MEDICATION Take 3 capsules by mouth daily. Balance of Nature vegetables    [provider]  oxyCODONE-acetaminophen (PERCOCET/ROXICET) 5-325 MG tablet Take 1-2 tablets by mouth every 6 (six) hours as needed for severe pain. 05/21/21   Gary Fleet, PA-C  rivaroxaban (XARELTO) 20 MG TABS tablet Take 1 tablet (20 mg total) by mouth daily with supper. 03/24/21   Lorretta Harp, MD  tiZANidine (ZANAFLEX) 2 MG tablet Take 1 tablet (2 mg total) by  mouth every 8 (eight) hours as needed for muscle spasms. 05/21/21   Gary Fleet, PA-C  Wheat Dextrin (BENEFIBER DRINK MIX PO) Take 1 packet by mouth daily as needed (constipation).    [provider]      Allergies    Patient has no known allergies.    Review of Systems   Review of Systems  Constitutional:  Positive for fever. Negative for chills, diaphoresis and fatigue.  HENT:  Negative for congestion.   Eyes:  Negative for visual disturbance.  Respiratory:  Positive for chest tightness and shortness of breath. Negative for cough, sputum production and wheezing.    Cardiovascular:  Positive for chest pain. Negative for claudication.  Gastrointestinal:  Negative for abdominal pain, constipation, diarrhea, nausea and vomiting.  Genitourinary:  Negative for dysuria, flank pain and frequency.  Musculoskeletal:  Negative for back pain, neck pain and neck stiffness.  Skin:  Negative for rash and wound.  Neurological:  Negative for dizziness, weakness, light-headedness, numbness and headaches.  Psychiatric/Behavioral:  Negative for agitation and confusion.   All other systems reviewed and are negative.  Physical Exam Updated Vital Signs BP (!) 148/93    Pulse 98    Temp 99.1 F (37.3 C) (Rectal)    Resp 18    Ht '5\' 10"'$  (1.778 m)    Wt 74.4 kg    SpO2 99%    BMI 23.53 kg/m  Physical Exam Vitals and nursing note reviewed.  Constitutional:      General: He is not in acute distress.    Appearance: He is well-developed. He is not ill-appearing, toxic-appearing or diaphoretic.  HENT:     Head: Normocephalic and atraumatic.     Mouth/Throat:     Mouth: Mucous membranes are moist.  Eyes:     Conjunctiva/sclera: Conjunctivae normal.     Pupils: Pupils are equal, round, and reactive to light.  Cardiovascular:     Rate and Rhythm: Regular rhythm. Tachycardia present.     Heart sounds: No murmur heard. Pulmonary:     Effort: Pulmonary effort is normal. No respiratory distress.     Breath sounds: Normal breath sounds. No wheezing, rhonchi or rales.  Chest:     Chest wall: No tenderness.  Abdominal:     General: Abdomen is flat. There is no distension.     Palpations: Abdomen is soft.     Tenderness: There is no abdominal tenderness. There is no guarding or rebound.  Musculoskeletal:        General: No swelling or tenderness.     Cervical back: Neck supple. No tenderness.     Right lower leg: No edema.     Left lower leg: No edema.     Comments: Surgical dressing on right knee.  It is warm but not focally tender.  Good sensation, strength, and pulses  distally.  No tenderness otherwise.  Abrasion to the back of head with some dried bleeding.  No large laceration seen initially  Skin:    General: Skin is warm and dry.     Capillary Refill: Capillary refill takes less than 2 seconds.     Findings: No erythema or rash.  Neurological:     General: No focal deficit present.     Mental Status: He is alert.     Sensory: No sensory deficit.     Motor: No weakness.  Psychiatric:        Mood and Affect: Mood normal.    ED Results / Procedures /  Treatments   Labs (all labs ordered are listed, but only abnormal results are displayed) Labs Reviewed  COMPREHENSIVE METABOLIC PANEL - Abnormal; Notable for the following components:      Result Value   Glucose, Bld 129 (*)    Total Protein 6.2 (*)    Albumin 2.9 (*)    All other components within normal limits  CBC - Abnormal; Notable for the following components:   RBC 4.05 (*)    HCT 38.9 (*)    All other components within normal limits  URINALYSIS, ROUTINE W REFLEX MICROSCOPIC - Abnormal; Notable for the following components:   APPearance HAZY (*)    Specific Gravity, Urine 1.036 (*)    Hgb urine dipstick MODERATE (*)    Ketones, ur 80 (*)    Bacteria, UA FEW (*)    All other components within normal limits  LACTIC ACID, PLASMA - Abnormal; Notable for the following components:   Lactic Acid, Venous 2.0 (*)    All other components within normal limits  PROTIME-INR - Abnormal; Notable for the following components:   Prothrombin Time 24.7 (*)    INR 2.2 (*)    All other components within normal limits  I-STAT CHEM 8, ED - Abnormal; Notable for the following components:   Potassium 3.2 (*)    Glucose, Bld 114 (*)    Calcium, Ion 0.96 (*)    TCO2 20 (*)    All other components within normal limits  RESP PANEL BY RT-PCR (FLU A&B, COVID) ARPGX2  CULTURE, BLOOD (ROUTINE X 2)  CULTURE, BLOOD (ROUTINE X 2)  URINE CULTURE  ETHANOL  HEPARIN LEVEL (UNFRACTIONATED)  SAMPLE TO BLOOD BANK     EKG EKG Interpretation  Date/Time:  Thursday May 27 2021 10:20:05 EST Ventricular Rate:  101 PR Interval:  158 QRS Duration: 139 QT Interval:  379 QTC Calculation: 492 R Axis:   64 Text Interpretation: Sinus tachycardia Right bundle branch block ST elevation, consider lateral injury When compared to prior, faster rate. t wave inversion in lead V3 now upright. No STEMI Confirmed by Antony Blackbird [phone]) on 05/27/2021 10:54:48 AM  Radiology CT HEAD WO CONTRAST  Result Date: 05/27/2021 CLINICAL DATA:  Fall EXAM: CT HEAD WITHOUT CONTRAST TECHNIQUE: Contiguous axial images were obtained from the base of the skull through the vertex without intravenous contrast. RADIATION DOSE REDUCTION: This exam was performed according to the departmental dose-optimization program which includes automated exposure control, adjustment of the mA and/or kV according to patient size and/or use of iterative reconstruction technique. COMPARISON:  Brain MRI 05/04/2019 FINDINGS: Brain: There is no evidence of acute intracranial hemorrhage, extra-axial fluid collection, or acute infarct. Parenchymal volume is normal for age. The ventricles are normal in size. Gray-white differentiation is preserved. There are patchy foci of hypodensity in the subcortical and periventricular white matter likely reflecting sequela of mild chronic white matter microangiopathy. There is no mass lesion.  There is no mass effect or midline shift. Vascular: There is calcification of the bilateral cavernous ICAs. Skull: Normal. Negative for fracture or focal lesion. Sinuses/Orbits: The paranasal sinuses are clear. The globes and orbits are unremarkable. Other: None. IMPRESSION: No acute intracranial pathology. Electronically Signed   By: Valetta Mole M.D.   On: 05/27/2021 11:12   CT Angio Chest PE W and/or Wo Contrast  Result Date: 05/27/2021 CLINICAL DATA:  Fall. History of knee surgery, pulmonary embolism suspected EXAM: CT ANGIOGRAPHY CHEST  WITH CONTRAST TECHNIQUE: Multidetector CT imaging of the chest was performed using  the standard protocol during bolus administration of intravenous contrast. Multiplanar CT image reconstructions and MIPs were obtained to evaluate the vascular anatomy. RADIATION DOSE REDUCTION: This exam was performed according to the departmental dose-optimization program which includes automated exposure control, adjustment of the mA and/or kV according to patient size and/or use of iterative reconstruction technique. CONTRAST:  19m OMNIPAQUE IOHEXOL 350 MG/ML SOLN COMPARISON:  04/18/2018 FINDINGS: Cardiovascular: Normal heart size. No pericardial effusion. Extensive atheromatous calcification of the coronaries. Mild aortic valvular calcification. Unfortunate streak artifact from intravenous contrast and prosthesis in the right arm. No enhancement seen within some basal segmental branches of the right lower lobe, but streak artifact is seen across this vessel and the adjacent pulmonary vein. As well, the left lower lobe pulmonary arteries are not opacified at this level of the scan either. Mediastinum/Nodes: Negative for adenopathy or mass. Enlargement the left laryngeal ventricle which is chronic based on prior imaging. Lungs/Pleura: Airway thickening with hazy atelectasis at the lung bases. No consolidation, edema, effusion, or pneumothorax. Upper Abdomen: Negative Musculoskeletal: Right glenohumeral arthroplasty. Generalized thoracic spine degeneration. No acute or aggressive finding. Review of the MIP images confirms the above findings. IMPRESSION: Technically limited study with nondiagnostic assessment of the lower lobes. There is a concerning area for right lower lobe pulmonary embolism but it is in an area too affected by artifact to be diagnostic. Consider alternate approach if the patient cannot tolerate full bolus and arms up positioning. 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 Aortic Atherosclerosis (ICD10-I70.0).  Electronically Signed   By: JJorje GuildM.D.   On: 05/27/2021 11:13   CT CERVICAL SPINE WO CONTRAST  Result Date: 05/27/2021 CLINICAL DATA:  Fall EXAM: CT CERVICAL SPINE WITHOUT CONTRAST TECHNIQUE: Multidetector CT imaging of the cervical spine was performed without intravenous contrast. Multiplanar CT image reconstructions were also generated. RADIATION DOSE REDUCTION: This exam was performed according to the departmental dose-optimization program which includes automated exposure control, adjustment of the mA and/or kV according to patient size and/or use of iterative reconstruction technique. COMPARISON:  None. FINDINGS: Alignment: There is trace grade 1 anterolisthesis of C3 on C4, C4 on C5, and C7 on T1, likely degenerative in nature. Alignment is otherwise normal. There is no jumped or perched facets or other evidence of traumatic malalignment. Skull base and vertebrae: Skull base alignment is maintained. Vertebral body heights are preserved. There is no evidence of acute fracture. Soft tissues and spinal canal: No prevertebral fluid or swelling. No visible canal hematoma. Disc levels: There is marked intervertebral disc space narrowing at C5-C6 with associated degenerative endplate change. There is more mild disc space narrowing at the other levels. Facet arthropathy is most advanced at C4-C5 and C5-C6. There is no high-grade spinal canal stenosis. There is up to moderate to severe left worse than right neural foraminal stenosis at C5-C6. Upper chest: The imaged lung apices are clear. Other: There is medialization of the left aryepiglottic fold with asymmetric enlargement of the left piriform sinus and laryngeal ventricle. IMPRESSION: 1. No acute fracture or traumatic malalignment of the cervical spine. 2. Multilevel degenerative changes, most advanced at C5-C6. 3. 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 Correlate with history and consider ENT referral as indicated. Electronically Signed    By: PValetta MoleM.D.   On: 05/27/2021 11:20   DG Chest Port 1 View  Result Date: 05/27/2021 CLINICAL DATA:  Trauma.  Fall. EXAM: PORTABLE CHEST 1 VIEW COMPARISON:  Chest radiographs 09/30/2020 FINDINGS: The cardiomediastinal silhouette is unchanged with normal  heart size. The thoracic aorta is tortuous. Lung volumes are low with asymmetric mild elevation of the right hemidiaphragm. No airspace consolidation, edema, sizable pleural effusion, or pneumothorax is identified. A right shoulder arthroplasty is noted. IMPRESSION: No active disease. Electronically Signed   By: Logan Bores M.D.   On: 05/27/2021 10:40   DG Knee Right Port  Result Date: 05/27/2021 CLINICAL DATA:  Fall, trauma EXAM: PORTABLE RIGHT KNEE - 1-2 VIEW COMPARISON:  None. FINDINGS: Postsurgical changes reflecting right knee arthroplasty are seen. Hardware alignment is within expected limits, without evidence of hardware related complication. There is no acute fracture or dislocation. The soft tissues are unremarkable. There is no effusion. IMPRESSION: Status post right knee arthroplasty. No evidence of acute fracture or dislocation. Electronically Signed   By: Valetta Mole M.D.   On: 05/27/2021 10:43   VAS [place] LOWER EXTREMITY VENOUS (DVT) (ONLY [facility] & [facility])  Result Date: 05/27/2021  Lower Venous DVT Study Patient Name:  CREIGHTON LONGLEY  Date of Exam:   05/27/2021 Medical Rec #: [id number]         Accession #:    [id number] Date of Birth: 08-21-1945         Patient Gender: M Patient Age:   18 years Exam Location:  [facility] Procedure:      VAS [place] LOWER EXTREMITY VENOUS (DVT) Referring Phys: Marda Stalker --------------------------------------------------------------------------------  Indications: Pain, Swelling, and Right TKR 6 days ago. Syncope. Probable PE by CT.  Risk Factors: Factor V Leiden. Comparison Study: No prior right LEV on fil.e Performing Technologist: Sharion Dove RVS  Examination Guidelines: A complete evaluation  includes B-mode imaging, spectral Doppler, color Doppler, and power Doppler as needed of all accessible portions of each vessel. Bilateral testing is considered an integral part of a complete examination. Limited examinations for reoccurring indications may be performed as noted. The reflux portion of the exam is performed with the patient in reverse Trendelenburg.  +---------+---------------+---------+-----------+----------------+-------------+  RIGHT     Compressibility Phasicity Spontaneity Properties       Thrombus                                                                         Aging          +---------+---------------+---------+-----------+----------------+-------------+  CFV       Full            Yes       Yes                                         +---------+---------------+---------+-----------+----------------+-------------+  SFJ       Full                                                                  +---------+---------------+---------+-----------+----------------+-------------+  FV Prox   Full                                                                  +---------+---------------+---------+-----------+----------------+-------------+  FV Mid    Full                                                                  +---------+---------------+---------+-----------+----------------+-------------+  FV Distal Full                                                                  +---------+---------------+---------+-----------+----------------+-------------+  PFV       Full                                                                  +---------+---------------+---------+-----------+----------------+-------------+  POP       Full            No        No          spongy           Acute                                                           w/compression                   +---------+---------------+---------+-----------+----------------+-------------+  PTV       Full                                                                   +---------+---------------+---------+-----------+----------------+-------------+  PERO      Full                                                                  +---------+---------------+---------+-----------+----------------+-------------+  Gastroc   Full            No        No          spongy  w/compression                   +---------+---------------+---------+-----------+----------------+-------------+   +----+---------------+---------+-----------+----------+--------------+  LEFT Compressibility Phasicity Spontaneity Properties Thrombus Aging  +----+---------------+---------+-----------+----------+--------------+  CFV  Full            Yes       Yes                                    +----+---------------+---------+-----------+----------+--------------+     Summary: RIGHT: - Findings consistent with acute deep vein thrombosis involving the right popliteal vein, right posterior tibial veins, right peroneal veins, and right gastrocnemius veins.  LEFT: - No evidence of common femoral vein obstruction.  *See table(s) above for measurements and observations.    Preliminary     Procedures Procedures    CRITICAL CARE Performed by: Gwenyth Allegra Gethsemane Fischler Total critical care time: 35 minutes Critical care time was exclusive of separately billable procedures and treating other patients. Critical care was necessary to treat or prevent imminent or life-threatening deterioration. Critical care was time spent personally by me on the following activities: development of treatment plan with patient and/or surrogate as well as nursing, discussions with consultants, evaluation of patient's response to treatment, examination of patient, obtaining history from patient or surrogate, ordering and performing treatments and interventions, ordering and review of laboratory studies, ordering and review of  radiographic studies, pulse oximetry and re-evaluation of patient's condition.   Medications Ordered in ED Medications  acetaminophen (TYLENOL) tablet 650 mg (has no administration in time range)    Or  acetaminophen (TYLENOL) suppository 650 mg (has no administration in time range)  docusate sodium (COLACE) capsule 100 mg (has no administration in time range)  polyethylene glycol (MIRALAX / GLYCOLAX) packet 17 g (has no administration in time range)  ondansetron (ZOFRAN) tablet 4 mg (has no administration in time range)    Or  ondansetron (ZOFRAN) injection 4 mg (has no administration in time range)  heparin ADULT infusion 100 units/mL (25000 units/29m) (1,250 Units/hr Intravenous New Bag/Given 05/27/21 1549)  iohexol (OMNIPAQUE) 350 MG/ML injection 50 mL (50 mLs Intravenous Contrast Given 05/27/21 1055)  heparin bolus via infusion 3,000 Units (3,000 Units Intravenous Bolus from Bag 05/27/21 1549)    ED Course/ Medical Decision Making/ A&P                           Medical Decision Making Amount and/or Complexity of Data Reviewed Labs: ordered. Radiology: ordered.  Risk Prescription drug management. Decision regarding hospitalization.    Nathan CAMINOis a 76y.o. 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  Patient also however is concerned as he was febrile with EMS with a temperature of 100.6 orally, has warmth to his right knee, has dysuria, fatigue, and is having shortness of breath that feels like previous pulmonary embolism to him.  Patient reports that he has been doing well but last night has been feeling more tired and when trying to get up to go the bathroom he had 2  falls.  He is unsure if he hit his head but EMS was concerned  about abrasion or laceration to the occiput.  Patient is denying headache or neck pain but due to the blood thinner use he was placed in a collar and brought in for evaluation.  He is however complaining of shortness of breath but denies chest pain.  He reports it feels "the same" as when he had blood clots in the past.  He reports his right leg is not hurting worse since the surgery however he agrees it is warm.  He denies any constipation or diarrhea but does describe some burning when he urinates and urgency which is new.  He denies any abdominal pain, back pain, or flank pains.  He thinks he was only on the ground for several minutes and did not think he lost consciousness.  Denies any neurological plaints at this time.  On exam, patient's airway was intact and breath sounds are equal bilaterally.  Patient was tachypneic and oxygen saturations were in the low 90s with EMS so they placed him on 2 L nasal cannula to attain oxygen saturation in the mid 90s.  Abdomen was nontender.  Back was nontender.  Neck was nontender but he had some discomfort with the cervical collar.  Will reassess the back of the head when collar has been removed after imaging.  No focal neurologic deficits initially with normal sensation and strength in extremities.  Pupils are symmetric and reactive normal extraocular movements.  Patient's right knee has dressing in place but is warm to the touch compared to the left side.  Intact sensation, strength, and pulses distally.  No hip tenderness.  No pelvis tenderness.  Clinically concern about several problems.  I am concerned about the patient's recent surgery and shortness of breath that feels like previous PE.  We will get CT PE study to rule this out.  We will also get ultrasound of the leg.  With his fever, warmth, dysuria, and fatigue, will get screening labs to look for occult infection as well as x-rays of the knee to  look for subcutaneous gas.  We will also call orthopedics for evaluation given the recent surgery and fever and knee warmth.  We will also get CT imaging of the head and neck for possible head injury related to fall on blood thinners.  Of note, patient says he did not take Xarelto last night but did take it this morning at 6 AM.  Anticipate reassessment after work-up to determine disposition.  12:17 PM Ultrasound tech is concern for hyperacute clot in his right leg.  CT PE study is inconclusive but is concerning for acute pulmonary embolism.  Urinalysis shows ketones but no nitrites or leukocytes so have less suspicion for UTI.  Metabolic panel shows normal kidney function.  CBC shows no symptoms or anemia.  Lactic acid is slightly elevated 2.0 but COVID and flu are negative.  CT head and neck reassuring and collar was removed.  Patient knows about his vocal cord paralysis that he is followed with ENT for years.  With his new DVT and PE and his report that he is still too short of breath to ambulate and lightheaded to stand, do not feel he safe for discharge home.  Will discuss with medicine either continuation of Xarelto which she took this morning for starting heparin.  We will also follow-up on Ortho recommendations in regards to his right knee pain and warmth given his fever with EMS.  Orthopedic saw patient and have low suspicion for septic joint or infection associated with surgery.  They will follow.  Patient will be admitted for further management of new DVT and PE after surgery leading to near syncope and fall.        Final Clinical Impression(s) / ED Diagnoses Final diagnoses:  Trauma  Acute pulmonary embolism without acute cor pulmonale, unspecified pulmonary embolism type (HCC)     Clinical Impression: 1. Acute pulmonary embolism without acute cor pulmonale, unspecified pulmonary embolism type ([place])   2. Trauma     Disposition: Admit  This note was prepared with assistance  of Dragon voice recognition software. Occasional wrong-word or sound-a-like substitutions may have occurred due to the inherent limitations of voice recognition software.     Nasiah Lehenbauer, Gwenyth Allegra, MD 05/27/21 [phone]

## 2021-05-27 NOTE — Progress Notes (Signed)
VASCULAR LAB ? ? ? ?Right lower extremity venous duplex has been performed. ? ?See CV proc for preliminary results. ? ? ?Eldred Lievanos, RVT ?05/27/2021, 11:32 AM ? ?

## 2021-05-27 NOTE — H&P (Signed)
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 total right knee arthroplasty on 05/21/21. Echo performed with patient supine on ED stretcher. Unable to roll patient to left lateral decubitus position to attempt better quality study. IMPRESSIONS  1. Left ventricular ejection fraction, by estimation, is 60 to 65%. The left ventricle has normal function. Left ventricular endocardial border not optimally defined to evaluate regional wall motion. Left ventricular diastolic function could not be evaluated.  2. Right ventricular systolic function is normal. The right ventricular size is normal. Tricuspid regurgitation signal is inadequate for assessing PA pressure.  3. The mitral valve is grossly normal. No evidence of mitral valve regurgitation.  4. The aortic valve was not well visualized. Aortic valve regurgitation is not visualized. No aortic stenosis is present. Conclusion(s)/Recommendation(s): Grossly preserved LV and RV  function in suboptimal study. FINDINGS  Left Ventricle: Left ventricular ejection fraction, by estimation, is 60 to 65%. The left ventricle has normal function. Left ventricular endocardial border not optimally defined to evaluate regional wall motion. Definity contrast agent was given IV to delineate the left ventricular endocardial borders. The left ventricular internal cavity size was small. Suboptimal image quality limits for assessment of left ventricular hypertrophy. Left ventricular diastolic function could not be evaluated. Right Ventricle: The right ventricular size is normal. No increase in right ventricular wall thickness. Right ventricular systolic function is normal. Tricuspid regurgitation signal is inadequate for assessing PA pressure. Left Atrium: Left atrial size was not well visualized. Right Atrium: Right atrial size was not well visualized. Pericardium: There is no evidence of pericardial effusion. Mitral Valve: The mitral valve is grossly normal. No evidence of mitral valve regurgitation. Tricuspid Valve: The tricuspid valve is grossly normal. Tricuspid valve regurgitation is not demonstrated. Aortic Valve: The aortic valve was not well visualized. Aortic valve regurgitation is not visualized. No aortic stenosis is present. Aortic valve mean gradient measures 3.0 mmHg. Aortic valve peak gradient measures 3.3 mmHg. Pulmonic Valve: The pulmonic valve was not well visualized. Pulmonic valve regurgitation is not visualized. Aorta: The aortic root was not well visualized and the ascending aorta was not well visualized. IAS/Shunts: The interatrial septum was not well visualized.  RIGHT VENTRICLE RV S prime:     11.40 cm/s TAPSE (M-mode): 1.4 cm LEFT ATRIUM             Index LA Vol (A2C):   31.0 ml 16.16 ml/m LA Vol (A4C):   36.7 ml 19.13 ml/m LA Biplane Vol: 34.5 ml 17.98 ml/m  AORTIC VALVE AV Vmax:  91.10 cm/s AV Vmean:          78.000 cm/s AV VTI:            0.166 m AV Peak Grad:       3.3 mmHg AV Mean Grad:      3.0 mmHg LVOT Vmax:         75.50 cm/s LVOT Vmean:        54.600 cm/s LVOT VTI:          0.170 m LVOT/AV VTI ratio: 1.02  SHUNTS Systemic VTI: 0.17 m Rudean Haskell MD Electronically signed by Rudean Haskell MD Signature Date/Time: 05/27/2021/4:49:01 PM    Final    VAS [place] LOWER EXTREMITY VENOUS (DVT) (ONLY [facility] & [facility])  Result Date: 05/27/2021  Lower Venous DVT Study Patient Name:  ABEM SHADDIX  Date of Exam:   05/27/2021 Medical Rec #: [id number]         Accession #:    [id number] Date of Birth: 06-04-1945         Patient Gender: M Patient Age:   64 years Exam Location:  [facility] Procedure:      VAS [place] LOWER EXTREMITY VENOUS (DVT) Referring Phys: Marda Stalker --------------------------------------------------------------------------------  Indications: Pain, Swelling, and Right TKR 6 days ago. Syncope. Probable PE by CT.  Risk Factors: Factor V Leiden. Comparison Study: No prior right LEV on fil.e Performing Technologist: Sharion Dove RVS  Examination Guidelines: A complete evaluation includes B-mode imaging, spectral Doppler, color Doppler, and power Doppler as needed of all accessible portions of each vessel. Bilateral testing is considered an integral part of a complete examination. Limited examinations for reoccurring indications may be performed as noted. The reflux portion of the exam is performed with the patient in reverse Trendelenburg.  +---------+---------------+---------+-----------+----------------+-------------+  RIGHT     Compressibility Phasicity Spontaneity Properties       Thrombus                                                                         Aging          +---------+---------------+---------+-----------+----------------+-------------+  CFV       Full            Yes       Yes                                         +---------+---------------+---------+-----------+----------------+-------------+  SFJ       Full                                                                   +---------+---------------+---------+-----------+----------------+-------------+  FV Prox   Full                                                                  +---------+---------------+---------+-----------+----------------+-------------+  FV Mid    Full                                                                  +---------+---------------+---------+-----------+----------------+-------------+  FV Distal Full                                                                  +---------+---------------+---------+-----------+----------------+-------------+  PFV       Full                                                                  +---------+---------------+---------+-----------+----------------+-------------+  POP       Full            No        No          spongy           Acute                                                           w/compression                   +---------+---------------+---------+-----------+----------------+-------------+  PTV       Full                                                                  +---------+---------------+---------+-----------+----------------+-------------+  PERO      Full                                                                  +---------+---------------+---------+-----------+----------------+-------------+  Gastroc   Full            No        No          spongy  w/compression                   +---------+---------------+---------+-----------+----------------+-------------+   +----+---------------+---------+-----------+----------+--------------+  LEFT Compressibility Phasicity Spontaneity Properties Thrombus Aging  +----+---------------+---------+-----------+----------+--------------+  CFV  Full            Yes       Yes                                    +----+---------------+---------+-----------+----------+--------------+      Summary: RIGHT: - Findings consistent with acute deep vein thrombosis involving the right popliteal vein, right posterior tibial veins, right peroneal veins, and right gastrocnemius veins.  LEFT: - No evidence of common femoral vein obstruction.  *See table(s) above for measurements and observations.    Preliminary     Other results: EKG:  Sinus tachycardia with RBBB, unchanged from previous EKG on 03/24/21.   Assessment & Plan by Problem: Principal Problem:   Acute pulmonary embolism (HCC)  #Syncope #Acute Pulmonary Embolism (Low risk) Patient was brought into the ED with multiple falls, near syncope, and concern for head trauma. Patient was tachypneic and oxygen saturations were in the low 90s with EMS so they placed him on 2 L nasal cannula to attain oxygen saturation in the mid 90s. CT head was negative. CXR was negative for fracture. CT angio showed right lower lobe PE and chronic left vocal cord paresis. Lower extremity vascular US showed findings consistent with acute DVT involving the right popliteal vein, right posterior tibial veins, right peroneal veins, and right gastrocnemius veins. Echocardiogram showed LV EF 72-62%, RV systolic function normal, no valvular abnormalities. Patient has a history of Factor V Leiden and is currently on Xarelto. He had recent surgery on his right knee and has been much less active than usual. Possible failure of anticoagulation with Xarelto, although patient has been consistent with taking medication. PT 24.7. Will consult hematology. Patient denies chest pain, neck pain, SOB, and headaches. He is hemodynamically stable. No right heart strains. SBP in the 110s-130s. O2 sats above 93% on RA.  - Heparin drip with bolus, per pharmacy consult -Telemetry - Heparin level - Continuous pulse oximetry, O2 supplementation as needed - Consult hematology concerning resuming Xalreto or trying Eliquis  S/p right Total Knee Arthroplasty POD #6. Patient denies hurting  knee after the fall. Knee x-ray does not show any evidence of acute fracture or dislocation. Patient evaluated by Ortho PA. Do not see any signs of surgical infection. Denies any fever or chills.  -CTM -Dr. Berenice Primas to evaluate in AM  #AKI Patient reports history of decreased appetite over the past few days and decreased fluid intake. Creatinine increased from 0.70 to 1.02 on repeat measurement today. Encourage PO fluid intake and monitor for improvement. - Encourage po intake - BMP  #Atrial fibrillation Home med is metoprolol '25mg'$  daily. HR in the 110s-120s. Will restart home med. -Tele - Metoprolol '25mg'$  daily  #GERD - Protonix '40mg'$  daily  #Constipation Patient reports no solid BM in 2 days. Taking benefiber and colace at home. Started on Colace after recent surgery. - bowel regimen  Diet: Regular VTE: Heparin IV IVF: None Code: Full   Dispo: Admit patient to Inpatient with expected length of stay greater than 2 midnights.  This is a Careers information officer Note.  The care of the patient was discussed with Dr. Coy Saunas and the assessment and plan was formulated with their assistance.  Please  see their note for official documentation of the patient encounter.   Signed: Nelva Nay, Medical Student 05/27/2021, 5:00 PM   Pager: [phone] After 5pm on weekdays and 1pm on weekends: On Call pager: [phone]  I have reviewed the note by Nelva Nay MS3 and was present during the interview and physical exam. Agree with the findings, assessment, and plan.  Signed: Lacinda Axon, MD 05/27/2021, 6:08 PM

## 2021-05-27 NOTE — Progress Notes (Signed)
Responded to  level 2 Fall on thinners page to support patient  and staff. Provided emotional and spiritual support. ? ?Cristopher Peru, [facility], Pager [phone]  ?

## 2021-05-27 NOTE — Progress Notes (Signed)
?  Echocardiogram ?2D Echocardiogram has been performed. ? ?Nathan Rodriguez ?05/27/2021, 4:32 PM ?

## 2021-05-27 NOTE — Hospital Course (Addendum)
"  Doesn't feel like he wants to get up out of bed and do anything." Hard BM. When he's been moving he had tachycardia, thinks it was up to around 150. When he does his biking it gets up to 170.  Not as good as he felt later in the day yesterday, felt a little wobbly on his feet when got up to use restroom. Just overall body weakness, took a lot of effort when he moves from bed to [facility]. Some lightheadedness when he stood up but not necessarily when he sits up in bed. Took a while to not feel like this again after getting back in bed. Heavy breathing with exertion, no chest pain. Has been able to tolerate PO intake. Appetite is finally back after several days. Did finish breakfast this morning. No pain other than knee, shoulders. Ortho hasn't been by this morning but did late yesterday afternoon--he rebandaged his knee. Feels like his abdomen is firm "you could hit it with a baseball bat" had dizziness with exertion to sit up for exam. PT eval ordered. ? ? ?#Acute Pulmonary Embolism ?#DVT ?Patient presented to the ED on 05/27/21 after multiple falls that morning and concern for head trauma. Patient was tachypneic and oxygen saturations were in the low 90s with EMS so they placed him on 2 L nasal cannula to attain oxygen saturation in the mid 90s. CT head was negative. CXR was negative for fracture. CT angio showed right lower lobe PE and chronic left vocal cord paresis. Lower extremity vascular US showed findings consistent with acute DVT involving the right popliteal vein, right posterior tibial veins, right peroneal veins, and right gastrocnemius veins. Echocardiogram showed LV EF 83-41%, RV systolic function normal, no valvular abnormalities. Patient received heparin bolus, and started on heparin drip. Patient transitioned to xarelto therapeutic dosing on 3/10.  ? ?#AKI ? ? ?#S/p right total knee arthroplasty ? ? ? ? ? ? ? ?

## 2021-05-27 NOTE — ED Notes (Signed)
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                                                                  +---------+---------------+---------+-----------+----------------+-------------+  Gastroc   Full            No        No          spongy                                                                           w/compression                   +---------+---------------+---------+-----------+----------------+-------------+   +----+---------------+---------+-----------+----------+--------------+  LEFT Compressibility Phasicity Spontaneity Properties Thrombus Aging  +----+---------------+---------+-----------+----------+--------------+  CFV  Full            Yes       Yes                                    +----+---------------+---------+-----------+----------+--------------+     Summary: RIGHT: - Findings consistent with acute deep vein thrombosis involving the right popliteal vein, right posterior tibial veins, right peroneal veins, and right gastrocnemius  veins.  LEFT: - No evidence of common femoral vein obstruction.  *See table(s) above for measurements and observations.    Preliminary     Pending Labs Unresulted Labs (From admission, onward)     Start     Ordered   05/28/21 0500  Heparin level (unfractionated)  Daily,   R      05/27/21 1533   05/28/21 0500  CBC  Daily,   R      05/27/21 1533   05/27/21 2330  Heparin level (unfractionated)  Once-Timed,   TIMED        05/27/21 1533   05/27/21 1106  Urine Culture  Once,   STAT       Question:  Indication  Answer:  Urgency/frequency   05/27/21 1107   05/27/21 1023  Blood culture (routine x 2)  BLOOD CULTURE X 2,   STAT      05/27/21 1025            Vitals/Pain Today's Vitals   05/27/21 1330 05/27/21 1345 05/27/21 1445 05/27/21 1700  BP: 128/88 (!) 137/91 (!) 148/93 135/85  Pulse: (!) 105 95 98 (!) 102  Resp: '18 18 18 18  '$ Temp:      TempSrc:      SpO2: 97% 100% 99% 96%  Weight:      Height:      PainSc:        Isolation Precautions No active isolations  Medications Medications  acetaminophen (TYLENOL) tablet 650 mg (has no administration in time range)    Or  acetaminophen (TYLENOL) suppository 650 mg (has no administration in time range)  docusate sodium (COLACE) capsule 100 mg (has no administration in time range)  polyethylene glycol (MIRALAX / GLYCOLAX) packet 17 g (has no administration in time range)  ondansetron (ZOFRAN) tablet 4 mg (has no administration in time range)    Or  ondansetron (ZOFRAN) injection 4 mg (has no administration in time range)  heparin ADULT infusion 100 units/mL (25000 units/263m) (1,250 Units/hr Intravenous New Bag/Given 05/27/21 1549)  perflutren lipid microspheres (DEFINITY) IV suspension (2 mLs Intravenous Given 05/27/21 1632)  metoprolol tartrate (LOPRESSOR) tablet 25 mg (  has no administration in time range)  pantoprazole (PROTONIX) EC tablet 40 mg (has no administration in time range)  tiZANidine (ZANAFLEX) tablet 2 mg (has no  administration in time range)  iohexol (OMNIPAQUE) 350 MG/ML injection 50 mL (50 mLs Intravenous Contrast Given 05/27/21 1055)  heparin bolus via infusion 3,000 Units (3,000 Units Intravenous Bolus from Bag 05/27/21 1549)  Tdap (BOOSTRIX) injection 0.5 mL (0.5 mLs Intramuscular Given 05/27/21 1724)    Mobility walks Low fall risk   Focused Assessments Pulmonary Assessment Handoff:  Lung sounds: Bilateral Breath Sounds: Clear O2 Device: Room Air      R Recommendations: See Admitting Provider Note  Report given to:   Additional Notes:

## 2021-05-27 NOTE — ED Triage Notes (Signed)
Patient BIB [facility] from home. Complaint of multiple falls this morning. Pt had right knee surgery 6 days ago. Complaint of frequent urination since last night. Pt with laceration to back of head. VSS. NAD. ?

## 2021-05-27 NOTE — Plan of Care (Signed)
  Problem: Education: Goal: Knowledge of General Education information will improve Description: Including pain rating scale, medication(s)/side effects and non-pharmacologic comfort measures Outcome: Progressing   Problem: Health Behavior/Discharge Planning: Goal: Ability to manage health-related needs will improve Outcome: Progressing   Problem: Clinical Measurements: Goal: Ability to maintain clinical measurements within normal limits will improve Outcome: Progressing   Problem: Clinical Measurements: Goal: Respiratory complications will improve Outcome: Progressing   Problem: Clinical Measurements: Goal: Cardiovascular complication will be avoided Outcome: Progressing   Problem: Activity: Goal: Risk for activity intolerance will decrease Outcome: Progressing   Problem: Pain Managment: Goal: General experience of comfort will improve Outcome: Progressing   Problem: Safety: Goal: Ability to remain free from injury will improve Outcome: Progressing   Problem: Skin Integrity: Goal: Risk for impaired skin integrity will decrease Outcome: Progressing   

## 2021-05-28 DIAGNOSIS — I2699 Other pulmonary embolism without acute cor pulmonale: Principal | ICD-10-CM

## 2021-05-28 LAB — BASIC METABOLIC PANEL
Anion gap: 10 (ref 5–15)
BUN: 19 mg/dL (ref 8–23)
CO2: 22 mmol/L (ref 22–32)
Calcium: 8.6 mg/dL — ABNORMAL LOW (ref 8.9–10.3)
Chloride: 101 mmol/L (ref 98–111)
Creatinine, Ser: 1.02 mg/dL (ref 0.61–1.24)
GFR, Estimated: >60 mL/min (ref >60)
Glucose, Bld: 142 mg/dL — ABNORMAL HIGH (ref 70–99)
Potassium: 3.9 mmol/L (ref 3.5–5.1)
Sodium: 133 mmol/L — ABNORMAL LOW (ref 135–145)

## 2021-05-28 LAB — CBC
HCT: 40.5 % (ref 39.0–52.0)
Hemoglobin: 14.1 g/dL (ref 13.0–17.0)
MCH: 32.2 pg (ref 26.0–34.0)
MCHC: 34.8 g/dL (ref 30.0–36.0)
MCV: 92.5 fL (ref 80.0–100.0)
Platelets: 313 10*3/uL (ref 150–400)
RBC: 4.38 MIL/uL (ref 4.22–5.81)
RDW: 12 % (ref 11.5–15.5)
WBC: 9.6 10*3/uL (ref 4.0–10.5)
nRBC: 0 % (ref 0.0–0.2)

## 2021-05-28 LAB — URINE CULTURE

## 2021-05-28 LAB — APTT
aPTT: 64 seconds — ABNORMAL HIGH (ref 24–36)
aPTT: 64 seconds — ABNORMAL HIGH (ref 24–36)

## 2021-05-28 LAB — HEPARIN LEVEL (UNFRACTIONATED)
Heparin Unfractionated: 0.92 IU/mL — ABNORMAL HIGH (ref 0.30–0.70)
Heparin Unfractionated: 0.52 IU/mL (ref 0.30–0.70)

## 2021-05-28 LAB — LACTIC ACID, PLASMA: Lactic Acid, Venous: 1.1 mmol/L (ref 0.5–1.9)

## 2021-05-28 MED ORDER — LACTATED RINGERS IV SOLN: INTRAVENOUS | Status: AC

## 2021-05-28 MED ORDER — RIVAROXABAN 15 MG PO TABS
15.0000 mg | ORAL_TABLET | Freq: Two times a day (BID) | ORAL | Status: DC
Start: 2021-05-28 — End: 2021-05-29
  Administered 2021-05-28 – 2021-05-29 (×3): 15 mg via ORAL
  Filled 2021-05-28 (×3): qty 1

## 2021-05-28 MED ORDER — RIVAROXABAN 20 MG PO TABS
20.0000 mg | ORAL_TABLET | Freq: Every day | ORAL | Status: DC
Start: 2021-06-18 — End: 2021-05-29

## 2021-05-28 MED ORDER — OXYCODONE-ACETAMINOPHEN 5-325 MG PO TABS
1.0000 | ORAL_TABLET | Freq: Once | ORAL | Status: AC
  Administered 2021-05-28: 1 via ORAL
  Filled 2021-05-28: qty 1

## 2021-05-28 NOTE — Progress Notes (Signed)
PT Cancellation Note ? ?Patient Details ?Name: Nathan Rodriguez ?MRN: [id number] ?DOB: 07-03-1945 ? ? ?Cancelled Treatment:    Reason Eval/Treat Not Completed: Patient not medically ready (pt with acute PE and not yet 24hrs on heparin. Per department protocol require additional medical clearance to proceed with mobility prior to the 24hrs) ? ? ?[name] ?05/28/2021, 7:09 AM ?[name], PT ?Acute Rehabilitation Services ?Pager: [phone] ?Office: [phone] ? ?

## 2021-05-28 NOTE — Consult Note (Signed)
Asked by the IMTS to address the question of whether or not the current venous thromboembolic event (PE) would be considered due to a "failure" of his DOAC (rivaroxaban) that he was taking prior to this admission. I have reviewed the pre-operative notes surrounding his total knee arthroplasty performed 3-MAR-23. He was advised to discontinue his rivaroxaban for a 3 day (72h) period prior to surgery since spinal anesthesia was to be performed. There was cited in the Cardiologist's note that a period of 24h post-op, off of rivaroxaban would be warranted--and longer if necessary based upon the postoperative surgical wound. It appears that the patient was off of rivaroxaban for 96 hours. Documented also is a missed dose on 8-MAR-23, reported by the patient and cited by the pharmacist's note of 10-MAR-23 at 0126h. Given these findings, this is more likely a failure of being off of drug versus a failure of the drug. I have discussed with the team the following:  If parenteral heparin if felt to be necessary at this time, may give consideration to switching from [place] to SQ LMWH enoxaparin '1mg'$ /kg SQ q12h, OR--alternatively, could commence rivaroxaban at the full treatment dose (new clot burden) at '15mg'$  PO BID x 21 days; then, '20mg'$  PO daily thereafter. Since the patient has a  documented hypercoagulable state (FV-L), the continued use of '20mg'$  PO daily may be warranted (vs. Dropping to '10mg'$  PO daily at six months as per the labeling. Of note--patient has been on '20mg'$  PO rivaroxaban since his initial index event. Given that, the continued use of '20mg'$  rivaroxaban remains an option.  ?

## 2021-05-28 NOTE — Progress Notes (Signed)
ANTICOAGULATION CONSULT NOTE  ? ?Pharmacy Consult for heparin ?Indication: VTE treatment ? ?No Known Allergies ? ?Patient Measurements: ?Height: '5\' 10"'$  (177.8 cm) ?Weight: 73.5 kg (162 lb 0.6 oz) ?IBW/kg (Calculated) : 73 ?Heparin Dosing Weight: 74.4 kg ? ?Vital Signs: ?Temp: 98.2 ?F (36.8 ?C) (03/10 0011) ?Temp Source: Oral (03/10 0011) ?BP: 110/89 (03/10 0011) ?Pulse Rate: 82 (03/10 0011) ? ?Labs: ?Recent Labs  ?  05/27/21 ?1037 05/27/21 ?1044 05/28/21 ?0045  ?HGB 13.3 13.0 14.1  ?HCT 39.0 38.9* 40.5  ?PLT  --  266 313  ?APTT  --   --  64*  ?LABPROT  --  24.7*  --   ?INR  --  2.2*  --   ?HEPARINUNFRC  --   --  0.92*  ?CREATININE 0.70 1.02 1.02  ? ? ? ?Estimated Creatinine Clearance: 62.6 mL/min (by C-G formula based on SCr of 1.02 mg/dL). ? ? ?Medical History: ?Past Medical History:  ?Diagnosis Date  ? Anxiety   ? Arthritis   ? CAD (coronary artery disease)   ? Complication of anesthesia   ? hard time to wake up  ? Coronary artery disease   ? a. 2012  s/p prior DES x 2 to the LAD;  b. 05/2015 nl stress test [facility]);  c. 6/20217 Cath: LM nl, LAD patent stents, LCX/RCA nl, EF 55-65%.  ? DDD (degenerative disc disease)   ? Depression   ? DVT (deep venous thrombosis) ([place])   ? Dyspnea on exertion   ? a. has been seen by pulmonology - reportedly told that everything nl.  ? Dysrhythmia   ? Afib  ? Factor V deficiency ([place])   ? GERD (gastroesophageal reflux disease)   ? Hypercholesteremia   ? Hypertension   ? Hypertensive heart disease   ? Neuromuscular disorder ([place])   ? neuropathy feet  ? Nocturia   ? Occipital headache   ? sight changes without migraines, for over 30  years  ? Pre-diabetes   ? Pulmonary embolism ([place])   ? Scoliosis   ? Sleep apnea   ? mild,  no cpap could not tolerate  ? Tinnitus   ? ? ?Medications:  ? ?Scheduled:  ? docusate sodium  100 mg Oral BID  ? metoprolol tartrate  25 mg Oral Daily  ? pantoprazole  40 mg Oral Daily  ? ?Infusions:  ? heparin 1,250 Units/hr (05/27/21 2000)   ? ? ?Assessment: ?Patient is post op day 6 after right TKA. Patient admitted for fall on blood thinners. CT head was negative. CT Chest showed a concerning area for right lower lobe PE. Patient was taking Xarelto daily for history of DVT, PE, and factor V Leiden prior to admission. Patient reported missing PM dose of Xarelto on 05/26/21. Pharmacy consulted to transition from Xarelto to heparin for treatment of VTE while being managed inpatient. Doppler exam from 3/9 is pending. ? ?CrCl is 62.6 mL/min and CBC is stable. Spoke with MD and confirmed that bolus is warranted.  ? ?3/10 AM update: ?aPTT low ? ? ?Goal of Therapy:  ?Heparin level 0.3-0.7 units/ml ?aPTT 66-102 secs ?Monitor platelets by anticoagulation protocol: Yes ?  ?Plan:  ?Inc heparin to 1350 units/hr ?1000 aPTT and heparin level ? ?Narda Bonds, PharmD, BCPS ?Clinical Pharmacist ?Phone: [phone] ? ? ? ? ?

## 2021-05-28 NOTE — Progress Notes (Addendum)
? 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 ? ?Overnight Events: No acute overnight events. ? ?Interim History: Patient seen and evaluated at bedside with team on rounds. He reports feeling a little worse than yesterday evening. He has had lightheadedness when getting up to go to the bathroom and his heart rate increased to 150s. He endorses feeling unsteady when walking. He became lightheaded when sitting upright during physical exam. He denies chest pain, headaches, and dyspnea, palpitations, abdominal pain, or nausea. He reports improvement in his appetite and ate his entire breakfast in addition to drinking more fluids. He mentions that he previously had an IVC filter after prior clots, but had it removed. ? ?Objective: ?Vital signs in last 24 hours: ?Vitals:  ? 05/28/21 0011 05/28/21 0400 05/28/21 0724 05/28/21 1107  ?BP: 110/89 123/83 (!) 118/98 (!) 126/96  ?Pulse: 82 78 93 90  ?Resp: '18 18 20 18  '$ ?Temp: 98.2 ?F (36.8 ?C) 98.6 ?F (37 ?C) 97.8 ?F (36.6 ?C) 97.8 ?F (36.6 ?C)  ?TempSrc: Oral Oral Oral Oral  ?SpO2: 98% 96% 97% 92%  ?Weight: 73.5 kg     ?Height:      ? ?SpO2: 92 % ?Weight change:  ? ?Intake/Output Summary (Last 24 hours) at 05/28/2021 1328 ?Last data filed at 05/28/2021 1254 ?Gross per 24 hour  ?Intake 856.39 ml  ?Output 500 ml  ?Net 356.39 ml  ?  ?Physical Exam ?Vitals reviewed.  ?Constitutional:   ?   General: He is not in acute distress. ?   Appearance: He is not ill-appearing or toxic-appearing.  ?HENT:  ?   Head: Normocephalic.  ?Eyes:  ?   Extraocular Movements: Extraocular movements intact.  ?   Pupils: Pupils are equal, round, and reactive to light.  ?Cardiovascular:  ?   Rate and Rhythm:  Regular rhythm. Tachycardia present.  ?   Pulses: Normal pulses.  ?   Heart sounds: No murmur heard. ?  No friction rub. No gallop.  ?Pulmonary:  ?   Effort: Pulmonary effort is normal. No respiratory distress.  ?   Breath sounds: Normal breath sounds. No wheezing, rhonchi or rales.  ?Abdominal:  ?   General: Abdomen is flat. Bowel sounds are normal. There is no distension.  ?   Palpations: Abdomen is soft.  ?Musculoskeletal:     ?   General: No tenderness.  ?   Right lower leg: No edema.  ?   Left lower leg: No edema.  ?   Comments: Right knee wrapped in bandage.  ?Skin: ?   General: Skin is warm and dry.  ?Neurological:  ?   General: No focal deficit present.  ?   Mental Status: He is alert and oriented to person, place, and time.  ?   Motor: No weakness.  ?Psychiatric:     ?   Mood and Affect: Mood normal.     ?   Behavior: Behavior normal.  ?  ?Lab Results: ?Urine Culture: multiple species present ?Heparin level 0.52 ?aPTT 64 ?Lactic acid 1.1 ?BMP Latest Ref Rng & Units 05/28/2021 05/27/2021 05/27/2021  ?Glucose 70 - 99 mg/dL 142(H) 129(H) 114(H)  ?BUN 8 - 23 mg/dL '19 16 14  '$ ?Creatinine 0.61 - 1.24 mg/dL  1.02 1.02 0.70  ?BUN/Creat Ratio 10 - 24 - - -  ?Sodium 135 - 145 mmol/L 133(L) 135 139  ?Potassium 3.5 - 5.1 mmol/L 3.9 3.8 3.2(L)  ?Chloride 98 - 111 mmol/L 101 102 106  ?CO2 22 - 32 mmol/L 22 22 -  ?Calcium 8.9 - 10.3 mg/dL 8.6(L) 8.9 -  ? ?CBC Latest Ref Rng & Units 05/28/2021 05/27/2021 05/27/2021  ?WBC 4.0 - 10.5 K/uL 9.6 9.3 -  ?Hemoglobin 13.0 - 17.0 g/dL 14.1 13.0 13.3  ?Hematocrit 39.0 - 52.0 % 40.5 38.9(L) 39.0  ?Platelets 150 - 400 K/uL 313 266 -  ? ?Medications: ?Scheduled Meds: ? docusate sodium  100 mg Oral BID  ? metoprolol tartrate  25 mg Oral Daily  ? pantoprazole  40 mg Oral Daily  ? Rivaroxaban  15 mg Oral BID  ? Followed by  ? [START ON 06/18/2021] rivaroxaban  20 mg Oral Q supper  ? ?Continuous Infusions: ? lactated ringers 125 mL/hr at 05/28/21 1256  ? ?PRN Meds:.acetaminophen **OR** acetaminophen,  ondansetron **OR** ondansetron (ZOFRAN) IV, polyethylene glycol, tiZANidine ?Assessment/Plan: ?Principal Problem: ?  Acute pulmonary embolism ([place]) ? ?#Syncope ?#Acute Pulmonary Embolism, Low risk ?Consulted Dr. Burr Medico with hematology and Dr. Elie Confer with pharmacy for possible failure of anticoagulation with Xarelto. This is not considered a failure of xarelto as the patient had to hold xarelto for 4 days due to surgery and missed one dose post-op on 3/8. Patient denies chest pain, neck pain, dyspnea, and headaches. He is hemodynamically stable. No right heart strain as seen on EKG and echo. PESI score low risk. SBP in the 110s-120s. O2 sats above 92% on RA. Will discontinue heparin and restart xarelto. Given, acute DVTs and PT, will restart therapeutic dosing with BID dosing of xarelto. With patient's history of Factor V Leiden, patient will remain on xarelto '20mg'$  after initial 21 days. After further discussion with Dr. Burr Medico, patient should bridge with heparin as opposed to holding anticoagulation prior to surgery in the future.  ?- Telemetry ?- Continuous pulse oximetry, O2 supplementation as needed ?- CBC ?- Discontinue heparin drip ?- Start Xarelto '15mg'$  PO BID for 21 days, then '20mg'$  daily ?- Orthostatic vitals tomorrow ?  ?S/p right Total Knee Arthroplasty ?POD #7. Knee x-ray does not show any evidence of acute fracture or dislocation. Patient evaluated by Ortho with no concern for infection or other issues. Denies fever or chills.  ?- PT/OT as able for ambulation ?- Zanaflex '2mg'$  Q8H PRN ?  ?#AKI ?Patient reports history of decreased appetite over the past few days and decreased fluid intake. Creatinine unchanged at 1.02. Patient with lightheadedness on sitting up and tachycardia with ambulation today. Possibly secondary to decreased oral intake and dehydration. Encourage PO fluid intake and monitor for improvement. ?- Encourage po fluids ?- Start LR 125 ml/hr for 10 hrs. Reevaluate in AM. If patient with persistent  lightheadedness would continue IVF.  ?- BMP ?  ?Diet: Regular ?VTE: Xarelto ?IVF: LR ?Code: Full ? ?Prior to Admission Living Arrangement: Home ?Anticipated Discharge Location: Home ?Barriers to Discharge: medical stability ?Dispo: Anticipated discharge in approximately 1-2 day(s).  ? ?This is a Careers information officer Note.  The care of the patient was discussed with Dr. Dareen Piano and the assessment and plan formulated with their assistance.  Please see their attached note for official documentation of the daily encounter. ? ?Nelva Nay, Medical Student ?05/28/2021, 1:28 PM ?Pager: [phone] ?After 5pm on weekdays and 1pm on weekends: On Call pager [phone] ? ? ?

## 2021-05-28 NOTE — Progress Notes (Signed)
?Date: 05/28/2021 ? ?Patient name: Nathan Rodriguez  ?Medical record number: [id number]  ?Date of birth: 1945/04/14  ? ?I saw and examined the patient. I reviewed the resident?s H&P note and I agree with the resident?s findings and plan as documented in the resident?s note. ? ?In brief, patient is a 76 year old male with a past medical history of factor V Leiden with previous PE, A-fib, CAD status post PCI with 2 LAD stents, BPH, hypertension, hyperlipidemia, anxiety, status post recent right TKA on March 3 who presented to the ED after 2 falls at home. ? ?Patient states that he was initially doing well after his knee replacement but his appetite has been decreasing at home and he has not eaten much in the last couple of days and has noted dark concentrated urine when he pees.  He states that yesterday when he attempted to get up to go to the bathroom he felt lightheaded and weak and fell down.  He does report rapid breathing after he fell now.  He was helped up by his wife and approximately an hour and a half later he got up to go to the bathroom again and felt lightheaded as well and fell and hit the back of his head.  Patient does not remember hitting the back of his head and may have passed out.  No chest pain, no shortness of breath, no palpitations, no abdominal pain, no nausea or vomiting, no diarrhea, no fevers or chills. ? ?Today, patient states he still feels weak and does experience some lightheadedness when he attempts to sit up.  He was able to eat a little bit last night and ate his entire breakfast this morning. ? ?On exam, patient was in no acute distress but was noted to be tachycardic with a regular rhythm on auscultation.  His lungs were clear to auscultation bilaterally.  Abdomen is soft, nontender, nondistended with normoactive bowel sounds.  No lower extremity edema noted and no tenderness to palpation in his lower extremities.  His right knee was wrapped. ? ?Patient presented to the ED with 2  falls at home with a possible syncopal episode with the second fall.  I am uncertain if his falls are related to his PE or whether he was orthostatic secondary to decreased oral intake at home.  Patient was noted to be tachycardic today and did complain of lightheadedness on sitting up.  We will start the patient on IV hydration today to see if this will help improve his tachycardia and lightheadedness. ? ?Patient was found to have a right lower lobe PE as well as DVT in his right popliteal, right posterior tibial, right gastrocnemius and right peroneal veins.  There was a question of whether this was a Xarelto failure but this is unlikely as patient was off Xarelto for 96 hours for his surgery (72 hours before surgery 24 hours after) and also missed 1 dose of rivaroxaban at home.  I suspect that his recurrent PE and DVT are secondary to these missed doses in the setting of recent right knee replacement and consequent decreased mobility.  Patient's PE was determined to be low risk on the Pasi score as well as based on his clinical criteria.  2D echo showed no evidence of right heart strain.  Will DC heparin drip at this time and start the patient on full treatment dose of rivaroxaban at 15 mg orally twice a day for 21 days and then keep him on 20 mg daily thereafter.  My great  appreciation to Dr. Elie Confer for his input on this matter. ? ?We will continue to observe the patient today and attempt to DC him home over the weekend if he continues to improve. ? ? ?Aldine Contes, MD ?05/28/2021, 11:38 AM ? ?

## 2021-05-28 NOTE — TOC Progression Note (Addendum)
Transition of Care (TOC) - Progression Note  ? ? ?Patient Details  ?Name: Nathan Rodriguez ?MRN: [id number] ?Date of Birth: 1945/05/18 ? ?Transition of Care (TOC) CM/SW Contact  ?Zenon Mayo, RN ?Phone Number: ?05/28/2021, 4:26 PM ? ?Clinical Narrative:    ?From home , PE, had recent R knee sugery, had multiple falls at home in the past, conts on hep drip. He uses a walker at home since knee surgery.   Has been on xarelto pta . He uses [facility] in [place] .  His wile will transport him home at discharge. TOC will continue to follow for dc needs.  ? ? ?  ?  ? ?Expected Discharge Plan and Services ?  ?  ?  ?  ?  ?                ?  ?  ?  ?  ?  ?  ?  ?  ?  ?  ? ? ?Social Determinants of Health (SDOH) Interventions ?  ? ?Readmission Risk Interventions ?No flowsheet data found. ? ?

## 2021-05-28 NOTE — Consult Note (Signed)
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 Phasicity Spontaneity Properties       Thrombus                                                                         Aging          +---------+---------------+---------+-----------+----------------+-------------+  CFV       Full            Yes       Yes                                         +---------+---------------+---------+-----------+----------------+-------------+  SFJ       Full                                                                  +---------+---------------+---------+-----------+----------------+-------------+  FV Prox   Full                                                                  +---------+---------------+---------+-----------+----------------+-------------+  FV Mid    Full                                                                   +---------+---------------+---------+-----------+----------------+-------------+  FV Distal Full                                                                  +---------+---------------+---------+-----------+----------------+-------------+  PFV       Full                                                                  +---------+---------------+---------+-----------+----------------+-------------+  POP       Full            No        No          spongy           Acute                                                           w/compression                   +---------+---------------+---------+-----------+----------------+-------------+  PTV       Full                                                                  +---------+---------------+---------+-----------+----------------+-------------+  PERO      Full                                                                  +---------+---------------+---------+-----------+----------------+-------------+  Gastroc   Full            No        No          spongy                                                                           w/compression                   +---------+---------------+---------+-----------+----------------+-------------+   +----+---------------+---------+-----------+----------+--------------+  LEFT Compressibility Phasicity Spontaneity Properties Thrombus Aging  +----+---------------+---------+-----------+----------+--------------+  CFV  Full            Yes       Yes                                    +----+---------------+---------+-----------+----------+--------------+  Summary: RIGHT: - Findings consistent with acute deep vein thrombosis involving the right popliteal vein, right posterior tibial veins, right peroneal veins, and right gastrocnemius veins.  LEFT: - No evidence of common femoral vein obstruction.  *See table(s) above for measurements and observations. Electronically signed by Jamelle Haring on 05/27/2021  at 8:54:44 PM.    Final     ROS ROS: I have reviewed the patient's review of systems thoroughly and there are no positive responses as relates to the HPI.  Blood pressure 123/83, pulse 78, temperature 98.6 F (37 C), temperature source Oral, resp. rate 18, height '5\' 10"'$  (1.778 m), weight 73.5 kg, SpO2 96 %. Physical Exam Well-developed well-nourished patient in no acute distress. Alert and oriented x3 HEENT:within normal limits Cardiac: Regular rate and rhythm Pulmonary: Lungs clear to auscultation Abdomen: Soft and nontender.  Normal active bowel sounds  Musculoskeletal: (right knee: Minimal soft tissue swelling.  Trace effusion.  No instability.  Wound looks great without evidence of infection or other abnormality.  Range of motion passively is 0-90.  Actively is 0-60.  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  He is admitted to the medical service for workup of her syncopal episode.  I certainly appreciate their attendant of this to the patient.  In terms of the right knee looks great.  I have no concerns about infection or other issues with the knee.  We will continue him on ambulation per internal medicine and his current workup.  We will get therapy involved to continue to range the knee.  I am happy to see the patient again at any point but at this point feel that we'll follow peripherally.  Again I appreciate the help with the patient.  Feel free to contact me if there are any issues.  Alta Corning 05/28/2021, 6:46 AM  Cell [phone]

## 2021-05-28 NOTE — Discharge Instructions (Addendum)
Nathan Rodriguez,  ? ?It was a pleasure taking care of you at [facility] were admitted to the hospital after your recent fall. We were concerned about possible brain bleed and we ended up finding a blood clot in your lungs. We are treating this with a Xarelto starter pack. We are discharging you home now that you are doing better. Please follow the following instructions.  ?1) Continue Xarelto 15 mg twice daily for 21 days then start 20 mg daily ?2) Continue to outpatient PT and follow-up with Dr. Berenice Primas as scheduled. ?3) Call your PCP for hospital follow-up in 1 week ? ?Take care,  ?Dr. Linwood Dibbles, MD, MPH ? ?Instruction from Dr. Berenice Primas: ?Patient may be weightbearing as tolerated.  Nathan Rodriguez should follow standard postoperative total knee protocol.  Nathan Rodriguez should return to outpatient physical therapy when appropriate.  I will see him back in the office at his regular appointment. ? ?Information on my medicine - XARELTO? (rivaroxaban) ? ?This medication education was reviewed with me or my healthcare representative as part of my discharge preparation.   ?WHY WAS XARELTO? PRESCRIBED FOR YOU? ?Xarelto? was prescribed to treat blood clots that may have been found in the veins of your legs (deep vein thrombosis) or in your lungs (pulmonary embolism) and to reduce the risk of them occurring again. ? ?What do you need to know about Xarelto?? ?The starting dose is one 15 mg tablet taken TWICE daily with food for the FIRST 21 DAYS then on 06/18/21  the dose is changed to one 20 mg tablet taken ONCE A DAY with your evening meal. ? ?DO NOT stop taking Xarelto? without talking to the health care provider who prescribed the medication.  Refill your prescription for 20 mg tablets before you run out. ? ?After discharge, you should have regular check-up appointments with your healthcare provider that is prescribing your Xarelto?Marland Kitchen  In the future your dose may need to be changed if your kidney function changes by a significant  amount. ? ?What do you do if you miss a dose? ?If you are taking Xarelto? TWICE DAILY and you miss a dose, take it as soon as you remember. You may take two 15 mg tablets (total 30 mg) at the same time then resume your regularly scheduled 15 mg twice daily the next day. ? ?If you are taking Xarelto? ONCE DAILY and you miss a dose, take it as soon as you remember on the same day then continue your regularly scheduled once daily regimen the next day. Do not take two doses of Xarelto? at the same time.  ? ?Important Safety Information ?Xarelto? is a blood thinner medicine that can cause bleeding. You should call your healthcare provider right away if you experience any of the following: ?Bleeding from an injury or your nose that does not stop. ?Unusual colored urine (red or dark brown) or unusual colored stools (red or black). ?Unusual bruising for unknown reasons. ?A serious fall or if you hit your head (even if there is no bleeding). ? ?Some medicines may interact with Xarelto? and might increase your risk of bleeding while on Xarelto?Marland Kitchen To help avoid this, consult your healthcare provider or pharmacist prior to using any new prescription or non-prescription medications, including herbals, vitamins, non-steroidal anti-inflammatory drugs (NSAIDs) and supplements. ? ?This website has more information on Xarelto?: [email or web address] ? ?

## 2021-05-29 LAB — CBC
HCT: 39.1 % (ref 39.0–52.0)
Hemoglobin: 13 g/dL (ref 13.0–17.0)
MCH: 31.3 pg (ref 26.0–34.0)
MCHC: 33.2 g/dL (ref 30.0–36.0)
MCV: 94.2 fL (ref 80.0–100.0)
Platelets: 237 10*3/uL (ref 150–400)
RBC: 4.15 MIL/uL — ABNORMAL LOW (ref 4.22–5.81)
RDW: 12 % (ref 11.5–15.5)
WBC: 7.6 10*3/uL (ref 4.0–10.5)
nRBC: 0 % (ref 0.0–0.2)

## 2021-05-29 LAB — BASIC METABOLIC PANEL
Anion gap: 7 (ref 5–15)
BUN: 13 mg/dL (ref 8–23)
CO2: 23 mmol/L (ref 22–32)
Calcium: 8.6 mg/dL — ABNORMAL LOW (ref 8.9–10.3)
Chloride: 106 mmol/L (ref 98–111)
Creatinine, Ser: 0.88 mg/dL (ref 0.61–1.24)
GFR, Estimated: >60 mL/min (ref >60)
Glucose, Bld: 112 mg/dL — ABNORMAL HIGH (ref 70–99)
Potassium: 4 mmol/L (ref 3.5–5.1)
Sodium: 136 mmol/L (ref 135–145)

## 2021-05-29 MED ORDER — RIVAROXABAN 15 MG PO TABS: 15.0000 mg | ORAL_TABLET | Freq: Two times a day (BID) | ORAL | 0 refills | Status: DC

## 2021-05-29 MED ORDER — RIVAROXABAN 20 MG PO TABS: 20.0000 mg | ORAL_TABLET | Freq: Every day | ORAL | 1 refills | Status: DC

## 2021-05-29 NOTE — Discharge Summary (Cosign Needed)
Name: Nathan Rodriguez MRN: [id number] DOB: 04-04-1945 76 y.o. PCP: [name], MD  Date of Admission: 05/27/2021 10:15 AM Date of Discharge: 05/29/2021  05/29/2021 Attending Physician: Dr. Philipp Ovens  Discharge Diagnosis: Principal Problem:   Acute pulmonary embolism [facility])    Discharge Medications: Allergies as of 05/29/2021   No Known Allergies      Medication List     STOP taking these medications    CO Q 10 PO   Magnesium 250 MG Tabs   OVER THE COUNTER MEDICATION   OVER THE COUNTER MEDICATION   OVER THE COUNTER MEDICATION   oxyCODONE-acetaminophen 5-325 MG tablet Commonly known as: PERCOCET/ROXICET       TAKE these medications    aluminum hydroxide-magnesium carbonate 95-358 MG/15ML Susp Commonly known as: GAVISCON Take 15 mLs by mouth as needed for indigestion or heartburn.   Anucort-HC 25 MG suppository Generic drug: hydrocortisone Place 25 mg rectally at bedtime as needed for itching.   BENEFIBER DRINK MIX PO Take 1 packet by mouth in the morning and at bedtime.   docusate sodium 100 MG capsule Commonly known as: Colace Take 1 capsule (100 mg total) by mouth 2 (two) times daily.   hydrocortisone valerate cream 0.2 % Commonly known as: WESTCORT Apply 1 application topically 2 (two) times daily as needed (irritation).   metoprolol tartrate 25 MG tablet Commonly known as: LOPRESSOR Take 1 tablet (25 mg total) by mouth daily.   multivitamin with minerals Tabs tablet Take 1 tablet by mouth daily.   Omega-3 1000 MG Caps Take 1,000 mg by mouth daily.   omeprazole 20 MG capsule Commonly known as: PRILOSEC Take 20 mg by mouth daily.   Repatha SureClick 563 MG/ML Soaj Generic drug: Evolocumab INJECT 140 MG INTO THE SKIN EVERY 14 DAYS What changed:  how much to take how to take this when to take this additional instructions   Rivaroxaban 15 MG Tabs tablet Commonly known as: XARELTO Take 1 tablet (15 mg total) by mouth 2 (two) times  daily. What changed:  medication strength how much to take when to take this   rivaroxaban 20 MG Tabs tablet Commonly known as: XARELTO Take 1 tablet (20 mg total) by mouth daily with supper. Start taking on: June 18, 2021 What changed: You were already taking a medication with the same name, and this prescription was added. Make sure you understand how and when to take each.   tiZANidine 2 MG tablet Commonly known as: ZANAFLEX Take 1 tablet (2 mg total) by mouth every 8 (eight) hours as needed for muscle spasms.               Discharge Care Instructions  (From admission, onward)           Start     Ordered   05/29/21 0000  Leave dressing on - Keep it clean, dry, and intact until clinic visit        05/29/21 1141            Disposition and follow-up:   Nathan Rodriguez was discharged from [facility] in Stable condition. At the hospital follow up visit please address:  1.  Follow-up:  a.  Pulmonary emboli: Low risk PE likely due to missed Xarelto doses and recent knee surgery. Patient started on Xarelto starter pack. Respiratory status remained stable throughout hospitalization.  Instructed to follow-up with PCP for hospital follow-up in 1 week. Please ensure patient is taking his Xarelto as instructed.  b.  Syncope/fall: No syncope or falls during hospitalization. Patient advised to stay hydrated and be cautious with activities at home. Advised to use his walker for ambulation at home.   c.  Right knee arthroplasty: No evidence of fracture or infection of the knee.  Patient seen by Dr. Berenice Primas in the hospital and plan to follow-up in the outpatient. He will continue home PT.  2.  Labs / imaging needed at time of follow-up: None  3.  Pending labs/ test needing follow-up: None  Follow-up Appointments:  Follow-up Information     Dorna Leitz, MD Follow up.   Specialty: Orthopedic Surgery Why: patient has a scheduled appointment that he  should be able to keep. Contact information: [place] [address] [phone]         [name], MD Follow up in 1 week(s).   Specialty: Family Medicine Why: Call your primary care provider to schedule a hospital follow-up appointment. Contact information: [address] [place] [address] [phone]         Constance Haw, MD .   Specialty: Cardiology Contact information: [address] [place] [address] [phone]         Lorretta Harp, MD .   Specialties: Cardiology, Radiology Contact information: [address] [place] [address] [place] by problem list:  1) Acute Pulmonary Embolism (Low risk) Patient with a history of factor V Leiden on Xarelto for previous blood clots presented via EMS after multiple falls/syncope episodes. Imaging ruled out acute brain bleed. Patient required O2 supplementation briefly in the ER. CT angio showed right lower lobe pulmonary embolism and chronic left vocal cord paresis. Lower extremity vascular US showed findings consistent with acute DVT involving the right popliteal vein, right posterior tibial veins, right peroneal veins, and right gastrocnemius veins. Echocardiogram showed LV EF 60-65%, no right heart strain and no valvular abnormalities. Patient was started on heparin drip and transition to Xarelto starter pack 1 day prior to discharge after consulting our clinical pharmacist. Patient did not require any O2 supplementation during hospitalization and remained hemodynamically stable. He endorsed some dizziness on day 2 of hospitalization but this resolved on day of discharge. Patient discharged home on Xarelto starter pack with plan to continue 15 mg twice daily for 21 days then 20 mg daily afterwards.  2) syncope/fall Patient on anticoagulation with Xarelto presented to the ER via EMS due to concern for head trauma  after 2 syncope episodes at home. Patient denied hitting head did not endorse any headaches or neck pain on admission. Due to concern for brain bleed CT head was obtained which was negative.  CT cervical spine did not show any acute fracture but did show some C5-C6 degenerative changes and findings suspicious for left vocal cord paralysis. Patient reports he has been following ENT for their vocal cord paralysis.  Patient's speech was appropriate. Patient reported some dizziness during hospitalization but this had resolved by day of discharge. Patient's syncope/for was thought to be likely due to his pulmonary embolism as well as possible dehydration. Systolic blood pressure remained in the 120s to 130s with resolution of the dizziness at discharge.   3) S/p right Total Knee Arthroplasty Patient was postop day 6 on day of admission. He denied right knee pain after the fall. Knee x-ray did not show any evidence of acute fracture  or dislocation.  Patient evaluated by Dr. Berenice Primas orthopedic surgeon, who did not have any concerns about infection or other issues with the knee.  Patient will continue outpatient physical therapy and follow-up with Dr. Berenice Primas as scheduled.  4) Atrial fibrillation Patient found to be tachycardic on admission. Home metoprolol 25 mg resumed on admission. Heart rate improved to the 80s to 90s on day of discharge.    5) AKI, resolved Patient had a mild bump in creatinine from 0.7 to 1.02 on admission.  This improved to 0.88 on day of discharge.   6) GERD His home Protonix 40 mg daily was continue.  Subjective: Patient evaluated at bedside sitting comfortably in bed. Patient had just finished working with PT. States he did fine with PT and did not have any dizziness. He denies any chest pain, shortness of breath or leg pain. States this is the best he has felt since his knee surgery. He had a bowel movement yesterday and urine output has been appropriate.  Patient informed of reason  for change to Xarelto starter pack.  Patient ready for discharge home and advised to follow-up with PCP and Dr. Berenice Primas.  Discharge exam Discharge Vitals:   BP (!) 128/99 (BP Location: Left Arm)    Pulse 91    Temp 98 F (36.7 C) (Oral)    Resp 18    Ht '5\' 10"'$  (1.778 m)    Wt 74.8 kg    SpO2 98%    BMI 23.66 kg/m  General: Pleasant, well-appearing elderly man sitting comfortably in chair.  No acute distress. CV: RRR. No murmurs, rubs, or gallops. No LE edema Pulmonary: Lungs CTAB. Normal effort. No wheezing or rales. Abdominal: Soft, nontender, nondistended. Normal bowel sounds. Extremities: Radial and DP pulses 2+ and symmetric. Right knee covered with bandage.  Limited ROM of the R knee. Skin: Warm and dry. No obvious rash or lesions. Neuro: A&Ox3. Moves all extremities. Normal sensation. No focal deficit. Psych: Normal mood and affect   Pertinent Labs, Studies, and Procedures:  CBC Latest Ref Rng & Units 05/29/2021 05/28/2021 05/27/2021  WBC 4.0 - 10.5 K/uL 7.6 9.6 9.3  Hemoglobin 13.0 - 17.0 g/dL 13.0 14.1 13.0  Hematocrit 39.0 - 52.0 % 39.1 40.5 38.9(L)  Platelets 150 - 400 K/uL 237 313 266    CMP Latest Ref Rng & Units 05/29/2021 05/28/2021 05/27/2021  Glucose 70 - 99 mg/dL 112(H) 142(H) 129(H)  BUN 8 - 23 mg/dL '13 19 16  '$ Creatinine 0.61 - 1.24 mg/dL 0.88 1.02 1.02  Sodium 135 - 145 mmol/L 136 133(L) 135  Potassium 3.5 - 5.1 mmol/L 4.0 3.9 3.8  Chloride 98 - 111 mmol/L 106 101 102  CO2 22 - 32 mmol/L '23 22 22  '$ Calcium 8.9 - 10.3 mg/dL 8.6(L) 8.6(L) 8.9  Total Protein 6.5 - 8.1 g/dL - - 6.2(L)  Total Bilirubin 0.3 - 1.2 mg/dL - - 1.2  Alkaline Phos 38 - 126 U/L - - 52  AST 15 - 41 U/L - - 27  ALT 0 - 44 U/L - - 19    CT HEAD WO CONTRAST  Result Date: 05/27/2021 CLINICAL DATA:  Fall EXAM: CT HEAD WITHOUT CONTRAST TECHNIQUE: Contiguous axial images were obtained from the base of the skull through the vertex without intravenous contrast. RADIATION DOSE REDUCTION: This exam was  performed according to the departmental dose-optimization program which includes automated exposure control, adjustment of the mA and/or kV according to patient size and/or use of iterative reconstruction technique. COMPARISON:  Brain MRI 05/04/2019 FINDINGS: Brain: There is no evidence of acute intracranial hemorrhage, extra-axial fluid collection, or acute infarct. Parenchymal volume is normal for age. The ventricles are normal in size. Gray-white differentiation is preserved. There are patchy foci of hypodensity in the subcortical and periventricular white matter likely reflecting sequela of mild chronic white matter microangiopathy. There is no mass lesion.  There is no mass effect or midline shift. Vascular: There is calcification of the bilateral cavernous ICAs. Skull: Normal. Negative for fracture or focal lesion. Sinuses/Orbits: The paranasal sinuses are clear. The globes and orbits are unremarkable. Other: None. IMPRESSION: No acute intracranial pathology. Electronically Signed   By: Valetta Mole M.D.   On: 05/27/2021 11:12   CT Angio Chest PE W and/or Wo Contrast  Result Date: 05/27/2021 CLINICAL DATA:  Fall. History of knee surgery, pulmonary embolism suspected EXAM: CT ANGIOGRAPHY CHEST WITH CONTRAST TECHNIQUE: Multidetector CT imaging of the chest was performed using the standard protocol during bolus administration of intravenous contrast. Multiplanar CT image reconstructions and MIPs were obtained to evaluate the vascular anatomy. RADIATION DOSE REDUCTION: This exam was performed according to the departmental dose-optimization program which includes automated exposure control, adjustment of the mA and/or kV according to patient size and/or use of iterative reconstruction technique. CONTRAST:  19m OMNIPAQUE IOHEXOL 350 MG/ML SOLN COMPARISON:  04/18/2018 FINDINGS: Cardiovascular: Normal heart size. No pericardial effusion. Extensive atheromatous calcification of the coronaries. Mild aortic valvular  calcification. Unfortunate streak artifact from intravenous contrast and prosthesis in the right arm. No enhancement seen within some basal segmental branches of the right lower lobe, but streak artifact is seen across this vessel and the adjacent pulmonary vein. As well, the left lower lobe pulmonary arteries are not opacified at this level of the scan either. Mediastinum/Nodes: Negative for adenopathy or mass. Enlargement the left laryngeal ventricle which is chronic based on prior imaging. Lungs/Pleura: Airway thickening with hazy atelectasis at the lung bases. No consolidation, edema, effusion, or pneumothorax. Upper Abdomen: Negative Musculoskeletal: Right glenohumeral arthroplasty. Generalized thoracic spine degeneration. No acute or aggressive finding. Review of the MIP images confirms the above findings. IMPRESSION: Technically limited study with nondiagnostic assessment of the lower lobes. There is a concerning area for right lower lobe pulmonary embolism but it is in an area too affected by artifact to be diagnostic. Consider alternate approach if the patient cannot tolerate full bolus and arms up positioning. 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 Aortic Atherosclerosis (ICD10-I70.0). Electronically Signed   By: JJorje GuildM.D.   On: 05/27/2021 11:13   CT CERVICAL SPINE WO CONTRAST  Result Date: 05/27/2021 CLINICAL DATA:  Fall EXAM: CT CERVICAL SPINE WITHOUT CONTRAST TECHNIQUE: Multidetector CT imaging of the cervical spine was performed without intravenous contrast. Multiplanar CT image reconstructions were also generated. RADIATION DOSE REDUCTION: This exam was performed according to the departmental dose-optimization program which includes automated exposure control, adjustment of the mA and/or kV according to patient size and/or use of iterative reconstruction technique. COMPARISON:  None. FINDINGS: Alignment: There is trace grade 1 anterolisthesis of C3 on C4, C4 on C5, and C7 on T1,  likely degenerative in nature. Alignment is otherwise normal. There is no jumped or perched facets or other evidence of traumatic malalignment. Skull base and vertebrae: Skull base alignment is maintained. Vertebral body heights are preserved. There is no evidence of acute fracture. Soft tissues and spinal canal: No prevertebral fluid or swelling. No visible canal hematoma. Disc levels: There is marked intervertebral disc space narrowing at C5-C6  with associated degenerative endplate change. There is more mild disc space narrowing at the other levels. Facet arthropathy is most advanced at C4-C5 and C5-C6. There is no high-grade spinal canal stenosis. There is up to moderate to severe left worse than right neural foraminal stenosis at C5-C6. Upper chest: The imaged lung apices are clear. Other: There is medialization of the left aryepiglottic fold with asymmetric enlargement of the left piriform sinus and laryngeal ventricle. IMPRESSION: 1. No acute fracture or traumatic malalignment of the cervical spine. 2. Multilevel degenerative changes, most advanced at C5-C6. 3. 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 Correlate with history and consider ENT referral as indicated. Electronically Signed   By: Valetta Mole M.D.   On: 05/27/2021 11:20   DG Chest Port 1 View  Result Date: 05/27/2021 CLINICAL DATA:  Trauma.  Fall. EXAM: PORTABLE CHEST 1 VIEW COMPARISON:  Chest radiographs 09/30/2020 FINDINGS: The cardiomediastinal silhouette is unchanged with normal heart size. The thoracic aorta is tortuous. Lung volumes are low with asymmetric mild elevation of the right hemidiaphragm. No airspace consolidation, edema, sizable pleural effusion, or pneumothorax is identified. A right shoulder arthroplasty is noted. IMPRESSION: No active disease. Electronically Signed   By: Logan Bores M.D.   On: 05/27/2021 10:40   DG Knee Right Port  Result Date: 05/27/2021 CLINICAL DATA:  Fall, trauma EXAM: PORTABLE RIGHT KNEE -  1-2 VIEW COMPARISON:  None. FINDINGS: Postsurgical changes reflecting right knee arthroplasty are seen. Hardware alignment is within expected limits, without evidence of hardware related complication. There is no acute fracture or dislocation. The soft tissues are unremarkable. There is no effusion. IMPRESSION: Status post right knee arthroplasty. No evidence of acute fracture or dislocation. Electronically Signed   By: Valetta Mole M.D.   On: 05/27/2021 10:43   ECHOCARDIOGRAM COMPLETE  Result Date: 05/27/2021    ECHOCARDIOGRAM REPORT   Patient Name:   Nathan Rodriguez Date of Exam: 05/27/2021 Medical Rec #:  [id number]        Height:       70.0 in Accession #:    [id number]       Weight:       164.0 lb Date of Birth:  1946/03/08        BSA:          1.918 m Patient Age:    52 years         BP:           148/93 mmHg Patient Gender: M                HR:           99 bpm. Exam Location:  Inpatient Procedure: 2D Echo, Cardiac Doppler, Color Doppler and Intracardiac            Opacification Agent Indications:    Pulmonary embolus  History:        Patient has prior history of Echocardiogram examinations, most                 recent 04/19/2018. CAD; Risk Factors:Dyslipidemia, Hypertension                 and Former Smoker. DOE. Hx DVT.  Sonographer:    Clayton Lefort RDCS (AE) Referring Phys: [id number] Courtney Paris  Sonographer Comments: Technically challenging study due to limited acoustic windows, Technically difficult study due to poor echo windows, suboptimal parasternal window, suboptimal apical window and no subcostal window. [place] technicall challenging study. Patient had  total right knee arthroplasty on 05/21/21. Echo performed with patient supine on ED stretcher. Unable to roll patient to left lateral decubitus position to attempt better quality study. IMPRESSIONS  1. Left ventricular ejection fraction, by estimation, is 60 to 65%. The left ventricle has normal function. Left ventricular endocardial border  not optimally defined to evaluate regional wall motion. Left ventricular diastolic function could not be evaluated.  2. Right ventricular systolic function is normal. The right ventricular size is normal. Tricuspid regurgitation signal is inadequate for assessing PA pressure.  3. The mitral valve is grossly normal. No evidence of mitral valve regurgitation.  4. The aortic valve was not well visualized. Aortic valve regurgitation is not visualized. No aortic stenosis is present. Conclusion(s)/Recommendation(s): Grossly preserved LV and RV function in suboptimal study. FINDINGS  Left Ventricle: Left ventricular ejection fraction, by estimation, is 60 to 65%. The left ventricle has normal function. Left ventricular endocardial border not optimally defined to evaluate regional wall motion. Definity contrast agent was given IV to delineate the left ventricular endocardial borders. The left ventricular internal cavity size was small. Suboptimal image quality limits for assessment of left ventricular hypertrophy. Left ventricular diastolic function could not be evaluated. Right Ventricle: The right ventricular size is normal. No increase in right ventricular wall thickness. Right ventricular systolic function is normal. Tricuspid regurgitation signal is inadequate for assessing PA pressure. Left Atrium: Left atrial size was not well visualized. Right Atrium: Right atrial size was not well visualized. Pericardium: There is no evidence of pericardial effusion. Mitral Valve: The mitral valve is grossly normal. No evidence of mitral valve regurgitation. Tricuspid Valve: The tricuspid valve is grossly normal. Tricuspid valve regurgitation is not demonstrated. Aortic Valve: The aortic valve was not well visualized. Aortic valve regurgitation is not visualized. No aortic stenosis is present. Aortic valve mean gradient measures 3.0 mmHg. Aortic valve peak gradient measures 3.3 mmHg. Pulmonic Valve: The pulmonic valve was not well  visualized. Pulmonic valve regurgitation is not visualized. Aorta: The aortic root was not well visualized and the ascending aorta was not well visualized. IAS/Shunts: The interatrial septum was not well visualized.  RIGHT VENTRICLE RV S prime:     11.40 cm/s TAPSE (M-mode): 1.4 cm LEFT ATRIUM             Index LA Vol (A2C):   31.0 ml 16.16 ml/m LA Vol (A4C):   36.7 ml 19.13 ml/m LA Biplane Vol: 34.5 ml 17.98 ml/m  AORTIC VALVE AV Vmax:           91.10 cm/s AV Vmean:          78.000 cm/s AV VTI:            0.166 m AV Peak Grad:      3.3 mmHg AV Mean Grad:      3.0 mmHg LVOT Vmax:         75.50 cm/s LVOT Vmean:        54.600 cm/s LVOT VTI:          0.170 m LVOT/AV VTI ratio: 1.02  SHUNTS Systemic VTI: 0.17 m Rudean Haskell MD Electronically signed by Rudean Haskell MD Signature Date/Time: 05/27/2021/4:49:01 PM    Final    VAS [place] LOWER EXTREMITY VENOUS (DVT) (ONLY [facility] & [facility])  Result Date: 05/27/2021  Lower Venous DVT Study Patient Name:  Nathan Rodriguez  Date of Exam:   05/27/2021 Medical Rec #: [id number]         Accession #:  [id number] Date of Birth: 1945/10/31         Patient Gender: M Patient Age:   81 years Exam Location[facility]  [facility] Procedure:      VAS [place] LOWER EXTREMITY VENOUS (DVT) Referring Phys: Marda Stalker --------------------------------------------------------------------------------  Indications: Pain, Swelling, and Right TKR 6 days ago. Syncope. Probable PE by CT.  Risk Factors: Factor V Leiden. Comparison Study: No prior right LEV on fil.e Performing Technologist: Sharion Dove RVS  Examination Guidelines: A complete evaluation includes B-mode imaging, spectral Doppler, color Doppler, and power Doppler as needed of all accessible portions of each vessel. Bilateral testing is considered an integral part of a complete examination. Limited examinations for reoccurring indications may be performed as noted. The reflux portion of the exam is performed with the patient in  reverse Trendelenburg.  +---------+---------------+---------+-----------+----------------+-------------+  RIGHT     Compressibility Phasicity Spontaneity Properties       Thrombus                                                                         Aging          +---------+---------------+---------+-----------+----------------+-------------+  CFV       Full            Yes       Yes                                         +---------+---------------+---------+-----------+----------------+-------------+  SFJ       Full                                                                  +---------+---------------+---------+-----------+----------------+-------------+  FV Prox   Full                                                                  +---------+---------------+---------+-----------+----------------+-------------+  FV Mid    Full                                                                  +---------+---------------+---------+-----------+----------------+-------------+  FV Distal Full                                                                  +---------+---------------+---------+-----------+----------------+-------------+  PFV       Full                                                                  +---------+---------------+---------+-----------+----------------+-------------+  POP       Full            No        No          spongy           Acute                                                           w/compression                   +---------+---------------+---------+-----------+----------------+-------------+  PTV       Full                                                                  +---------+---------------+---------+-----------+----------------+-------------+  PERO      Full                                                                  +---------+---------------+---------+-----------+----------------+-------------+  Gastroc   Full            No        No          spongy                                                                            w/compression                   +---------+---------------+---------+-----------+----------------+-------------+   +----+---------------+---------+-----------+----------+--------------+  LEFT Compressibility Phasicity Spontaneity Properties Thrombus Aging  +----+---------------+---------+-----------+----------+--------------+  CFV  Full            Yes       Yes                                    +----+---------------+---------+-----------+----------+--------------+     Summary: RIGHT: - Findings consistent with acute deep vein thrombosis involving the right popliteal vein, right posterior tibial veins, right peroneal veins, and right gastrocnemius veins.  LEFT: - No evidence of common femoral vein obstruction.  *See table(s) above for measurements and  observations. Electronically signed by Jamelle Haring on 05/27/2021 at 8:54:44 PM.    Final      Discharge Instructions: Discharge Instructions     Diet - low sodium heart healthy   Complete by: As directed    Increase activity slowly   Complete by: As directed    Leave dressing on - Keep it clean, dry, and intact until clinic visit   Complete by: As directed      Nathan Rodriguez,  It was a pleasure taking care of you at [place] were admitted to the hospital after your recent fall. We were concerned about possible brain bleed and we ended up finding a blood clot in your lungs. We are treating this with a Xarelto starter pack. We are discharging you home now that you are doing better. Please follow the following instructions.  1) Continue Xarelto 15 mg twice daily for 21 days then start 20 mg daily 2) Continue to outpatient PT and follow-up with Dr. Berenice Primas as scheduled. 3) Call your PCP for hospital follow-up in 1 week  Take care,  Dr. Linwood Dibbles, MD, MPH  Instruction from Dr. Berenice Primas: Patient may be weightbearing as tolerated.  He should follow standard  postoperative total knee protocol.  He should return to outpatient physical therapy when appropriate.  I will see him back in the office at his regular appointment.  Signed: Lacinda Axon, MD 05/29/2021, 9:13 PM   Pager: [phone]

## 2021-05-29 NOTE — Evaluation (Signed)
Physical Therapy Evaluation ?Patient Details ?Name: Nathan Rodriguez ?MRN: [id number] ?DOB: [birth year] ?Today's Date: 05/29/2021 ? ?History of Present Illness ? Pt adm 3/9 after multiple syncopal episodes at home. 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 Pt found to have acute PE. PMH - Factor V Leiden, PE, afib, CAD, HTN, rt total shoulder.  ?Clinical Impression ? Pt doing well with mobility. Ready for dc from PT standpoint. ?   ?   ? ?Recommendations for follow up therapy are one component of a multi-disciplinary discharge planning process, led by the attending physician.  Recommendations may be updated based on patient status, additional functional criteria and insurance authorization. ? ?Follow Up Recommendations Home health PT ? ?  ?Assistance Recommended at Discharge Intermittent Supervision/Assistance  ?Patient can return home with the following ? A little help with walking and/or transfers;Help with stairs or ramp for entrance ? ?  ?Equipment Recommendations None recommended by PT  ?Recommendations for Other Services ?    ?  ?Functional Status Assessment Patient has had a recent decline in their functional status and demonstrates the ability to make significant improvements in function in a reasonable and predictable amount of time.  ? ?  ?Precautions / Restrictions Precautions ?Precautions: Fall;Knee ?Restrictions ?RLE Weight Bearing: Weight bearing as tolerated  ? ?  ? ?Mobility ? Bed Mobility ?Overal bed mobility: Modified Independent ?Bed Mobility: Supine to Sit ?  ?  ?Supine to sit: Modified independent (Device/Increase time) ?  ?  ?  ?  ? ?Transfers ?Overall transfer level: Needs assistance ?Equipment used: Rolling walker (2 wheels) ?Transfers: Sit to/from Stand ?Sit to Stand: Supervision ?  ?  ?  ?  ?  ?General transfer comment: supervision for safety ?  ? ?Ambulation/Gait ?Ambulation/Gait assistance: Supervision ?Gait Distance (Feet): 140 Feet ?Assistive device: Rolling walker (2 wheels) ?Gait Pattern/deviations:  Step-through pattern, Decreased stride length ?Gait velocity: decr ?Gait velocity interpretation: 1.31 - 2.62 ft/sec, indicative of limited community ambulator ?  ?General Gait Details: supervision for safety ? ?Stairs ?  ?  ?  ?  ?  ? ?Wheelchair Mobility ?  ? ?Modified Rankin (Stroke Patients Only) ?  ? ?  ? ?Balance Overall balance assessment: Mild deficits observed, not formally tested ?  ?  ?  ?  ?  ?  ?  ?  ?  ?  ?  ?  ?  ?  ?  ?  ?  ?  ?   ? ? ? ?Pertinent Vitals/Pain Pain Assessment ?Pain Assessment: Faces ?Faces Pain Scale: Hurts little more ?Pain Location: lt knee with ROM ?Pain Descriptors / Indicators: Grimacing ?Pain Intervention(s): Monitored during session  ? ? ?Home Living Family/patient expects to be discharged to:: Private residence ?Living Arrangements: Spouse/significant other ?Available Help at Discharge: Family;Available 24 hours/day ?Type of Home: House ?Home Access: Stairs to enter ?  ?Entrance Stairs-Number of Steps: 1 ?Alternate Level Stairs-Number of Steps: flight ?Home Layout: Two level ?Home Equipment: Conservation officer, nature (2 wheels);Cane - single point;Toilet riser ?   ?  ?Prior Function Prior Level of Function : Independent/Modified Independent ?  ?  ?  ?  ?  ?  ?Mobility Comments: Independent and active prior to TKR last week. Bicycles multiple times a week. Since TKR has been amb with rolling walker. ?  ?  ? ? ?Hand Dominance  ? Dominant Hand: Left ? ?  ?Extremity/Trunk Assessment  ? Upper Extremity Assessment ?Upper Extremity Assessment: RUE deficits/detail ?RUE Deficits / Details: Limited shoulder ROM since total shoulder replacement ?  ? ?  Lower Extremity Assessment ?Lower Extremity Assessment: RLE deficits/detail ?RLE Deficits / Details: AAROM knee 5-85 degrees ?  ? ?   ?Communication  ? Communication: No difficulties  ?Cognition Arousal/Alertness: Awake/alert ?Behavior During Therapy: [facility] for tasks assessed/performed ?Overall Cognitive Status: Within Functional Limits for tasks  assessed ?  ?  ?  ?  ?  ?  ?  ?  ?  ?  ?  ?  ?  ?  ?  ?  ?  ?  ?  ? ?  ?General Comments General comments (skin integrity, edema, etc.): HR to 150 with amb. Back to 110's after seated x 2 mintues ? ?  ?Exercises Total Joint Exercises ?Knee Flexion: AAROM, Right, 10 reps, Seated  ? ?Assessment/Plan  ?  ?PT Assessment All further PT needs can be met in the next venue of care  ?PT Problem List Decreased strength;Decreased range of motion;Decreased mobility ? ?   ?  ?PT Treatment Interventions     ? ?PT Goals (Current goals can be found in the Care Plan section)  ?Acute Rehab PT Goals ?PT Goal Formulation: All assessment and education complete, DC therapy ? ?  ?Frequency   ?  ? ? ?Co-evaluation   ?  ?  ?  ?  ? ? ?  ?AM-PAC PT "6 Clicks" Mobility  ?Outcome Measure Help needed turning from your back to your side while in a flat bed without using bedrails?: None ?Help needed moving from lying on your back to sitting on the side of a flat bed without using bedrails?: None ?Help needed moving to and from a bed to a chair (including a wheelchair)?: A Little ?Help needed standing up from a chair using your arms (e.g., wheelchair or bedside chair)?: A Little ?Help needed to walk in hospital room?: A Little ?Help needed climbing 3-5 steps with a railing? : A Little ?6 Click Score: 20 ? ?  ?End of Session Equipment Utilized During Treatment: Gait belt ?Activity Tolerance: Patient tolerated treatment well ?Patient left: in chair;with call bell/phone within reach;with chair alarm set ?Nurse Communication: Mobility status ?PT Visit Diagnosis: Other abnormalities of gait and mobility (R26.89) ?  ? ?Time: 0347-4259 ?PT Time Calculation (min) (ACUTE ONLY): 22 min ? ? ?Charges:   PT Evaluation ?$PT Eval Low Complexity: 1 Low ?  ?  ?   ? ? ?[facility] PT ?Acute Rehabilitation Services ?Pager [phone] ?Office [phone] ? ? ?Shary Decamp [facility] ?05/29/2021, 11:06 AM ? ?

## 2021-05-29 NOTE — Discharge Summary (Deleted)
Name: Nathan Rodriguez MRN: [id number] DOB: Oct 18, 1945 76 y.o. PCP: [name], MD  Date of Admission: 05/27/2021 10:15 AM Date of Discharge:   05/29/2021 Attending Physician: Dr. Philipp Ovens  Discharge Diagnosis: Principal Problem:   Acute pulmonary embolism [facility])    Discharge Medications: Allergies as of 05/29/2021   No Known Allergies      Medication List     STOP taking these medications    CO Q 10 PO   Magnesium 250 MG Tabs   OVER THE COUNTER MEDICATION   OVER THE COUNTER MEDICATION   OVER THE COUNTER MEDICATION   oxyCODONE-acetaminophen 5-325 MG tablet Commonly known as: PERCOCET/ROXICET       TAKE these medications    aluminum hydroxide-magnesium carbonate 95-358 MG/15ML Susp Commonly known as: GAVISCON Take 15 mLs by mouth as needed for indigestion or heartburn.   Anucort-HC 25 MG suppository Generic drug: hydrocortisone Place 25 mg rectally at bedtime as needed for itching.   BENEFIBER DRINK MIX PO Take 1 packet by mouth in the morning and at bedtime.   docusate sodium 100 MG capsule Commonly known as: Colace Take 1 capsule (100 mg total) by mouth 2 (two) times daily.   hydrocortisone valerate cream 0.2 % Commonly known as: WESTCORT Apply 1 application topically 2 (two) times daily as needed (irritation).   metoprolol tartrate 25 MG tablet Commonly known as: LOPRESSOR Take 1 tablet (25 mg total) by mouth daily.   multivitamin with minerals Tabs tablet Take 1 tablet by mouth daily.   Omega-3 1000 MG Caps Take 1,000 mg by mouth daily.   omeprazole 20 MG capsule Commonly known as: PRILOSEC Take 20 mg by mouth daily.   Repatha SureClick 409 MG/ML Soaj Generic drug: Evolocumab INJECT 140 MG INTO THE SKIN EVERY 14 DAYS What changed:  how much to take how to take this when to take this additional instructions   Rivaroxaban 15 MG Tabs tablet Commonly known as: XARELTO Take 1 tablet (15 mg total) by mouth 2 (two) times daily. What  changed:  medication strength how much to take when to take this   rivaroxaban 20 MG Tabs tablet Commonly known as: XARELTO Take 1 tablet (20 mg total) by mouth daily with supper. Start taking on: June 18, 2021 What changed: You were already taking a medication with the same name, and this prescription was added. Make sure you understand how and when to take each.   tiZANidine 2 MG tablet Commonly known as: ZANAFLEX Take 1 tablet (2 mg total) by mouth every 8 (eight) hours as needed for muscle spasms.               Discharge Care Instructions  (From admission, onward)           Start     Ordered   05/29/21 0000  Leave dressing on - Keep it clean, dry, and intact until clinic visit        05/29/21 1141            Disposition and follow-up:   Nathan Rodriguez was discharged from [facility] in Stable condition. At the hospital follow up visit please address:  1.  Follow-up:  a.  Pulmonary emboli: Low risk PE likely due to missed Xarelto doses and recent knee surgery. Patient started on Xarelto starter pack. Respiratory status remained stable throughout hospitalization.  Instructed to follow-up with PCP for hospital follow-up in 1 week. Please ensure patient is taking his Xarelto as instructed.  b.  Syncope/fall: No syncope or falls during hospitalization. Patient advised to stay hydrated and be cautious with activities at home. Advised to use his walker for ambulation at home.   c.  Right knee arthroplasty: No evidence of fracture or infection of the knee.  Patient seen by Dr. Berenice Primas in the hospital and plan to follow-up in the outpatient. He will continue home PT.  2.  Labs / imaging needed at time of follow-up: None  3.  Pending labs/ test needing follow-up: None  Follow-up Appointments:  Follow-up Information     Dorna Leitz, MD Follow up.   Specialty: Orthopedic Surgery Why: patient has a scheduled appointment that he should be able  to keep. 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 Imaging ruled out acute brain bleed. Patient required O2 supplementation briefly in the ER. CT angio showed right lower lobe pulmonary embolism and chronic left vocal cord paresis. Lower extremity vascular US showed findings consistent with acute DVT involving the right popliteal vein, right posterior tibial veins, right peroneal veins, and right gastrocnemius veins. Echocardiogram showed LV EF 60-65%, no right heart strain and no valvular abnormalities. Patient was started on heparin drip and transition to Xarelto starter pack 1 day prior to discharge after consulting our clinical pharmacist. Patient did not require any O2 supplementation during hospitalization and remained hemodynamically stable. He endorsed some dizziness on day 2 of hospitalization but this resolved on day of discharge. Patient discharged home on Xarelto starter pack with plan to continue 15 mg twice daily for 21 days then 20 mg daily afterwards.  2) syncope/fall Patient on anticoagulation with Xarelto presented to the ER via EMS due to concern for head trauma after 2 syncope episodes at home. Patient denied hitting head did not endorse any headaches or neck pain on admission. Due to concern for brain bleed CT head was obtained which was negative.  CT cervical spine did not show any acute fracture but did show some C5-C6 degenerative changes and findings suspicious for left vocal cord paralysis. Patient reports he has been following ENT for their vocal cord paralysis.  Patient's speech was appropriate. Patient reported some dizziness during hospitalization but this had resolved by day of discharge. Patient's syncope/for was  thought to be likely due to his pulmonary embolism as well as possible dehydration. Systolic blood pressure remained in the 120s to 130s with resolution of the dizziness at discharge.   3) S/p right Total Knee Arthroplasty Patient was postop day 6 on day of admission. He denied right knee pain after the fall. Knee x-ray did not show any evidence of acute fracture or dislocation.  Patient evaluated by Dr. Berenice Primas orthopedic surgeon, who did not have any concerns about infection or other issues with the knee.  Patient will continue outpatient physical therapy and follow-up with Dr. Berenice Primas as scheduled.  4) Atrial fibrillation Patient found to be tachycardic on admission. Home metoprolol 25 mg resumed on admission. Heart rate improved to the 80s to 90s on day of discharge.    5) AKI, resolved Patient had a mild bump in creatinine from 0.7 to 1.02 on admission.  This improved to 0.88 on day of discharge.   6) GERD His  home Protonix 40 mg daily was continue.  Subjective: Patient evaluated at bedside sitting comfortably in bed. Patient had just finished working with PT. States he did fine with PT and did not have any dizziness. He denies any chest pain, shortness of breath or leg pain. States this is the best he has felt since his knee surgery. He had a bowel movement yesterday and urine output has been appropriate.  Patient informed of reason for change to Xarelto starter pack.  Patient ready for discharge home and advised to follow-up with PCP and Dr. Berenice Primas.  Discharge exam Discharge Vitals:   BP (!) 128/99 (BP Location: Left Arm)    Pulse 91    Temp 98 F (36.7 C) (Oral)    Resp 18    Ht '5\' 10"'$  (1.778 m)    Wt 74.8 kg    SpO2 98%    BMI 23.66 kg/m  General: Pleasant, well-appearing elderly man sitting comfortably in chair.  No acute distress. CV: RRR. No murmurs, rubs, or gallops. No LE edema Pulmonary: Lungs CTAB. Normal effort. No wheezing or rales. Abdominal: Soft, nontender, nondistended.  Normal bowel sounds. Extremities: Radial and DP pulses 2+ and symmetric. Right knee covered with bandage.  Limited ROM of the R knee. Skin: Warm and dry. No obvious rash or lesions. Neuro: A&Ox3. Moves all extremities. Normal sensation. No focal deficit. Psych: Normal mood and affect   Pertinent Labs, Studies, and Procedures:  CBC Latest Ref Rng & Units 05/29/2021 05/28/2021 05/27/2021  WBC 4.0 - 10.5 K/uL 7.6 9.6 9.3  Hemoglobin 13.0 - 17.0 g/dL 13.0 14.1 13.0  Hematocrit 39.0 - 52.0 % 39.1 40.5 38.9(L)  Platelets 150 - 400 K/uL 237 313 266    CMP Latest Ref Rng & Units 05/29/2021 05/28/2021 05/27/2021  Glucose 70 - 99 mg/dL 112(H) 142(H) 129(H)  BUN 8 - 23 mg/dL '13 19 16  '$ Creatinine 0.61 - 1.24 mg/dL 0.88 1.02 1.02  Sodium 135 - 145 mmol/L 136 133(L) 135  Potassium 3.5 - 5.1 mmol/L 4.0 3.9 3.8  Chloride 98 - 111 mmol/L 106 101 102  CO2 22 - 32 mmol/L '23 22 22  '$ Calcium 8.9 - 10.3 mg/dL 8.6(L) 8.6(L) 8.9  Total Protein 6.5 - 8.1 g/dL - - 6.2(L)  Total Bilirubin 0.3 - 1.2 mg/dL - - 1.2  Alkaline Phos 38 - 126 U/L - - 52  AST 15 - 41 U/L - - 27  ALT 0 - 44 U/L - - 19    CT HEAD WO CONTRAST  Result Date: 05/27/2021 CLINICAL DATA:  Fall EXAM: CT HEAD WITHOUT CONTRAST TECHNIQUE: Contiguous axial images were obtained from the base of the skull through the vertex without intravenous contrast. RADIATION DOSE REDUCTION: This exam was performed according to the departmental dose-optimization program which includes automated exposure control, adjustment of the mA and/or kV according to patient size and/or use of iterative reconstruction technique. COMPARISON:  Brain MRI 05/04/2019 FINDINGS: Brain: There is no evidence of acute intracranial hemorrhage, extra-axial fluid collection, or acute infarct. Parenchymal volume is normal for age. The ventricles are normal in size. Gray-white differentiation is preserved. There are patchy foci of hypodensity in the subcortical and periventricular white matter  likely reflecting sequela of mild chronic white matter microangiopathy. There is no mass lesion.  There is no mass effect or midline shift. Vascular: There is calcification of the bilateral cavernous ICAs. Skull: Normal. Negative for fracture or focal lesion. Sinuses/Orbits: The paranasal sinuses are clear. The globes and orbits are unremarkable.  Other: None. IMPRESSION: No acute intracranial pathology. Electronically Signed   By: Valetta Mole M.D.   On: 05/27/2021 11:12   CT Angio Chest PE W and/or Wo Contrast  Result Date: 05/27/2021 CLINICAL DATA:  Fall. History of knee surgery, pulmonary embolism suspected EXAM: CT ANGIOGRAPHY CHEST WITH CONTRAST TECHNIQUE: Multidetector CT imaging of the chest was performed using the standard protocol during bolus administration of intravenous contrast. Multiplanar CT image reconstructions and MIPs were obtained to evaluate the vascular anatomy. RADIATION DOSE REDUCTION: This exam was performed according to the departmental dose-optimization program which includes automated exposure control, adjustment of the mA and/or kV according to patient size and/or use of iterative reconstruction technique. CONTRAST:  78m OMNIPAQUE IOHEXOL 350 MG/ML SOLN COMPARISON:  04/18/2018 FINDINGS: Cardiovascular: Normal heart size. No pericardial effusion. Extensive atheromatous calcification of the coronaries. Mild aortic valvular calcification. Unfortunate streak artifact from intravenous contrast and prosthesis in the right arm. No enhancement seen within some basal segmental branches of the right lower lobe, but streak artifact is seen across this vessel and the adjacent pulmonary vein. As well, the left lower lobe pulmonary arteries are not opacified at this level of the scan either. Mediastinum/Nodes: Negative for adenopathy or mass. Enlargement the left laryngeal ventricle which is chronic based on prior imaging. Lungs/Pleura: Airway thickening with hazy atelectasis at the lung bases. No  consolidation, edema, effusion, or pneumothorax. Upper Abdomen: Negative Musculoskeletal: Right glenohumeral arthroplasty. Generalized thoracic spine degeneration. No acute or aggressive finding. Review of the MIP images confirms the above findings. IMPRESSION: Technically limited study with nondiagnostic assessment of the lower lobes. There is a concerning area for right lower lobe pulmonary embolism but it is in an area too affected by artifact to be diagnostic. Consider alternate approach if the patient cannot tolerate full bolus and arms up positioning. 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 Aortic Atherosclerosis (ICD10-I70.0). Electronically Signed   By: JJorje GuildM.D.   On: 05/27/2021 11:13   CT CERVICAL SPINE WO CONTRAST  Result Date: 05/27/2021 CLINICAL DATA:  Fall EXAM: CT CERVICAL SPINE WITHOUT CONTRAST TECHNIQUE: Multidetector CT imaging of the cervical spine was performed without intravenous contrast. Multiplanar CT image reconstructions were also generated. RADIATION DOSE REDUCTION: This exam was performed according to the departmental dose-optimization program which includes automated exposure control, adjustment of the mA and/or kV according to patient size and/or use of iterative reconstruction technique. COMPARISON:  None. FINDINGS: Alignment: There is trace grade 1 anterolisthesis of C3 on C4, C4 on C5, and C7 on T1, likely degenerative in nature. Alignment is otherwise normal. There is no jumped or perched facets or other evidence of traumatic malalignment. Skull base and vertebrae: Skull base alignment is maintained. Vertebral body heights are preserved. There is no evidence of acute fracture. Soft tissues and spinal canal: No prevertebral fluid or swelling. No visible canal hematoma. Disc levels: There is marked intervertebral disc space narrowing at C5-C6 with associated degenerative endplate change. There is more mild disc space narrowing at the other levels. Facet arthropathy is  most advanced at C4-C5 and C5-C6. There is no high-grade spinal canal stenosis. There is up to moderate to severe left worse than right neural foraminal stenosis at C5-C6. Upper chest: The imaged lung apices are clear. Other: There is medialization of the left aryepiglottic fold with asymmetric enlargement of the left piriform sinus and laryngeal ventricle. IMPRESSION: 1. No acute fracture or traumatic malalignment of the cervical spine. 2. Multilevel degenerative changes, most advanced at C5-C6. 3. Findings suspicious for  left vocal cord paralysis. Correlate with history and consider ENT referral as indicated. Electronically Signed   By: Valetta Mole M.D.   On: 05/27/2021 11:20   DG Chest Port 1 View  Result Date: 05/27/2021 CLINICAL DATA:  Trauma.  Fall. EXAM: PORTABLE CHEST 1 VIEW COMPARISON:  Chest radiographs 09/30/2020 FINDINGS: The cardiomediastinal silhouette is unchanged with normal heart size. The thoracic aorta is tortuous. Lung volumes are low with asymmetric mild elevation of the right hemidiaphragm. No airspace consolidation, edema, sizable pleural effusion, or pneumothorax is identified. A right shoulder arthroplasty is noted. IMPRESSION: No active disease. Electronically Signed   By: Logan Bores M.D.   On: 05/27/2021 10:40   DG Knee Right Port  Result Date: 05/27/2021 CLINICAL DATA:  Fall, trauma EXAM: PORTABLE RIGHT KNEE - 1-2 VIEW COMPARISON:  None. FINDINGS: Postsurgical changes reflecting right knee arthroplasty are seen. Hardware alignment is within expected limits, without evidence of hardware related complication. There is no acute fracture or dislocation. The soft tissues are unremarkable. There is no effusion. IMPRESSION: Status post right knee arthroplasty. No evidence of acute fracture or dislocation. Electronically Signed   By: Valetta Mole M.D.   On: 05/27/2021 10:43   ECHOCARDIOGRAM COMPLETE  Result Date: 05/27/2021    ECHOCARDIOGRAM REPORT   Patient Name:   ROCKEY GUARINO  Date of Exam: 05/27/2021 Medical Rec #:  [id number]        Height:       70.0 in Accession #:    [id number]       Weight:       164.0 lb Date of Birth:  January 16, 1946        BSA:          1.918 m Patient Age:    44 years         BP:           148/93 mmHg Patient Gender: M                HR:           99 bpm. Exam Location:  Inpatient Procedure: 2D Echo, Cardiac Doppler, Color Doppler and Intracardiac            Opacification Agent Indications:    Pulmonary embolus  History:        Patient has prior history of Echocardiogram examinations, most                 recent 04/19/2018. CAD; Risk Factors:Dyslipidemia, Hypertension                 and Former Smoker. DOE. Hx DVT.  Sonographer:    Clayton Lefort RDCS (AE) Referring Phys: [id number] Courtney Paris  Sonographer Comments: Technically challenging study due to limited acoustic windows, Technically difficult study due to poor echo windows, suboptimal parasternal window, suboptimal apical window and no subcostal window. [place] technicall challenging study. Patient had total right knee arthroplasty on 05/21/21. Echo performed with patient supine on ED stretcher. Unable to roll patient to left lateral decubitus position to attempt better quality study. IMPRESSIONS  1. Left ventricular ejection fraction, by estimation, is 60 to 65%. The left ventricle has normal function. Left ventricular endocardial border not optimally defined to evaluate regional wall motion. Left ventricular diastolic function could not be evaluated.  2. Right ventricular systolic function is normal. The right ventricular size is normal. Tricuspid regurgitation signal is inadequate for assessing PA pressure.  3. The mitral valve is grossly normal. No evidence  of mitral valve regurgitation.  4. The aortic valve was not well visualized. Aortic valve regurgitation is not visualized. No aortic stenosis is present. Conclusion(s)/Recommendation(s): Grossly preserved LV and RV function in suboptimal study.  FINDINGS  Left Ventricle: Left ventricular ejection fraction, by estimation, is 60 to 65%. The left ventricle has normal function. Left ventricular endocardial border not optimally defined to evaluate regional wall motion. Definity contrast agent was given IV to delineate the left ventricular endocardial borders. The left ventricular internal cavity size was small. Suboptimal image quality limits for assessment of left ventricular hypertrophy. Left ventricular diastolic function could not be evaluated. Right Ventricle: The right ventricular size is normal. No increase in right ventricular wall thickness. Right ventricular systolic function is normal. Tricuspid regurgitation signal is inadequate for assessing PA pressure. Left Atrium: Left atrial size was not well visualized. Right Atrium: Right atrial size was not well visualized. Pericardium: There is no evidence of pericardial effusion. Mitral Valve: The mitral valve is grossly normal. No evidence of mitral valve regurgitation. Tricuspid Valve: The tricuspid valve is grossly normal. Tricuspid valve regurgitation is not demonstrated. Aortic Valve: The aortic valve was not well visualized. Aortic valve regurgitation is not visualized. No aortic stenosis is present. Aortic valve mean gradient measures 3.0 mmHg. Aortic valve peak gradient measures 3.3 mmHg. Pulmonic Valve: The pulmonic valve was not well visualized. Pulmonic valve regurgitation is not visualized. Aorta: The aortic root was not well visualized and the ascending aorta was not well visualized. IAS/Shunts: The interatrial septum was not well visualized.  RIGHT VENTRICLE RV S prime:     11.40 cm/s TAPSE (M-mode): 1.4 cm LEFT ATRIUM             Index LA Vol (A2C):   31.0 ml 16.16 ml/m LA Vol (A4C):   36.7 ml 19.13 ml/m LA Biplane Vol: 34.5 ml 17.98 ml/m  AORTIC VALVE AV Vmax:           91.10 cm/s AV Vmean:          78.000 cm/s AV VTI:            0.166 m AV Peak Grad:      3.3 mmHg AV Mean Grad:      3.0  mmHg LVOT Vmax:         75.50 cm/s LVOT Vmean:        54.600 cm/s LVOT VTI:          0.170 m LVOT/AV VTI ratio: 1.02  SHUNTS Systemic VTI: 0.17 m Rudean Haskell MD Electronically signed by Rudean Haskell MD Signature Date/Time: 05/27/2021/4:49:01 PM    Final    VAS [place] LOWER EXTREMITY VENOUS (DVT) (ONLY [facility] & [facility])  Result Date: 05/27/2021  Lower Venous DVT Study Patient Name:  BOB DAVERSA  Date of Exam:   05/27/2021 Medical Rec #: [id number]         Accession #:    [id number] Date of Birth: 05-Feb-1946         Patient Gender: M Patient Age:   28 years Exam Location:  [facility] Procedure:      VAS [place] LOWER EXTREMITY VENOUS (DVT) Referring Phys: Marda Stalker --------------------------------------------------------------------------------  Indications: Pain, Swelling, and Right TKR 6 days ago. Syncope. Probable PE by CT.  Risk Factors: Factor V Leiden. Comparison Study: No prior right LEV on fil.e Performing Technologist: Sharion Dove RVS  Examination Guidelines: A complete evaluation includes B-mode imaging, spectral Doppler, color Doppler, and power Doppler as needed of all accessible  portions of each vessel. Bilateral testing is considered an integral part of a complete examination. Limited examinations for reoccurring indications may be performed as noted. The reflux portion of the exam is performed with the patient in reverse Trendelenburg.  +---------+---------------+---------+-----------+----------------+-------------+  RIGHT     Compressibility Phasicity Spontaneity Properties       Thrombus                                                                         Aging          +---------+---------------+---------+-----------+----------------+-------------+  CFV       Full            Yes       Yes                                         +---------+---------------+---------+-----------+----------------+-------------+  SFJ       Full                                                                   +---------+---------------+---------+-----------+----------------+-------------+  FV Prox   Full                                                                  +---------+---------------+---------+-----------+----------------+-------------+  FV Mid    Full                                                                  +---------+---------------+---------+-----------+----------------+-------------+  FV Distal Full                                                                  +---------+---------------+---------+-----------+----------------+-------------+  PFV       Full                                                                  +---------+---------------+---------+-----------+----------------+-------------+  POP       Full            No  No          spongy           Acute                                                           w/compression                   +---------+---------------+---------+-----------+----------------+-------------+  PTV       Full                                                                  +---------+---------------+---------+-----------+----------------+-------------+  PERO      Full                                                                  +---------+---------------+---------+-----------+----------------+-------------+  Gastroc   Full            No        No          spongy                                                                           w/compression                   +---------+---------------+---------+-----------+----------------+-------------+   +----+---------------+---------+-----------+----------+--------------+  LEFT Compressibility Phasicity Spontaneity Properties Thrombus Aging  +----+---------------+---------+-----------+----------+--------------+  CFV  Full            Yes       Yes                                    +----+---------------+---------+-----------+----------+--------------+     Summary: RIGHT: - Findings consistent  with acute deep vein thrombosis involving the right popliteal vein, right posterior tibial veins, right peroneal veins, and right gastrocnemius veins.  LEFT: - No evidence of common femoral vein obstruction.  *See table(s) above for measurements and observations. Electronically signed by Jamelle Haring on 05/27/2021 at 8:54:44 PM.    Final      Discharge Instructions: Mr. Zaring,  It was a pleasure taking care of you at [facility] were admitted to the hospital after your recent fall. We were concerned about possible brain bleed and we ended up finding a blood clot in your lungs. We are treating this with a Xarelto starter pack. We are discharging you home now that you are doing better. Please follow the following instructions.  1) Continue Xarelto 15 mg twice daily for 21 days then  start 20 mg daily 2) Continue to outpatient PT and follow-up with Dr. Berenice Primas as scheduled. 3) Call your PCP for hospital follow-up in 1 week  Take care,  Dr. Linwood Dibbles, MD, MPH  Instruction from Dr. Berenice Primas: Patient may be weightbearing as tolerated.  He should follow standard postoperative total knee protocol.  He should return to outpatient physical therapy when appropriate.  I will see him back in the office at his regular appointment.  Signed: Lacinda Axon, MD 05/29/2021, 11:31 AM   Pager: [phone]

## 2021-06-01 LAB — CULTURE, BLOOD (ROUTINE X 2)
Culture: NO GROWTH
Culture: NO GROWTH

## 2021-06-20 ENCOUNTER — Other Ambulatory Visit: Payer: Self-pay | Admitting: Cardiovascular Disease

## 2021-06-21 ENCOUNTER — Ambulatory Visit: Payer: Medicare Other | Admitting: Pharmacist Clinician (PhC)/ Clinical Pharmacy Specialist

## 2021-06-21 ENCOUNTER — Encounter: Payer: Self-pay | Admitting: Pharmacist Clinician (PhC)/ Clinical Pharmacy Specialist

## 2021-06-21 DIAGNOSIS — I2699 Other pulmonary embolism without acute cor pulmonale: Secondary | ICD-10-CM | POA: Diagnosis not present

## 2021-06-21 DIAGNOSIS — I119 Hypertensive heart disease without heart failure: Secondary | ICD-10-CM | POA: Diagnosis not present

## 2021-06-21 NOTE — Progress Notes (Signed)
? ? ? ?06/22/2021 ?[name] ?[birth year] ?[id number] ? ? ?HPI:  Nathan Rodriguez is a 76 y.o. male patient of Dr Gwenlyn Found, with a PMH below who presents today for hypertension clinic evaluation.  He was hospitalized in early March for right TKA, then again within a week of discharge for acute PE.  On most recent discharge he was told to stop magnesium and CoQ10 and he questions if it okay to take these again.   Since discharge he has cut back on oxycodone/apap and just gone to straight apap.  Unfortunately this has been giving him some stomach problems and he is looking for alternatives, as he cannot take NSAIDs.  He notes recently starting CBD capsules, and this is helping him relax and sleep at night.   ? ?Past Medical History: ?Hyperlipidemia 3/21 LDL 68 on Rep[place]  ?CAD Atherectomy, PCI w/DED 2012  ?VTE/FVL DVT/PE  2017, PE 2023, on lifelong anticoagulation  ?GERD On omeprazole daily  ?Pre-diabetes 2/23 A1c 5.8, down from 6.0 (7/22)  ?  ?Blood Pressure Goal:  130/80 ? ?Current Medications: metoprolol tartrate 25 mg bid ? ?Diet: mox of home and eating out; trys to go for low carb, low sugar; low fat meat choices; bananas, blueberries; fresh veggies (carrots, green beans, broccoli, califlower) ? ?Exercise: competitive cyclist not able with swollen knee ? ?Home BP readings: has home cuff, states older, unsure of accuracy ? ?Intolerances: nkda ? ?Labs: 05/29/21:  Na 136, K 4.0, Glu 112, BUN 13, SCr 0.88, GFR > 60 ? ? ?Wt Readings from Last 3 Encounters:  ?06/21/21 169 lb 12.8 oz (77 kg)  ?05/29/21 164 lb 14.5 oz (74.8 kg)  ?05/21/21 165 lb (74.8 kg)  ? ?BP Readings from Last 3 Encounters:  ?06/21/21 128/86  ?05/29/21 (!) 128/99  ?05/22/21 125/86  ? ?Pulse Readings from Last 3 Encounters:  ?06/21/21 91  ?05/29/21 91  ?05/22/21 94  ? ? ?Current Outpatient Medications  ?Medication Sig Dispense Refill  ? acetaminophen (TYLENOL) 500 MG tablet Take 500 mg by mouth every 6 (six) hours as needed.    ? aluminum  hydroxide-magnesium carbonate (GAVISCON) 95-358 MG/15ML SUSP Take 15 mLs by mouth as needed for indigestion or heartburn.    ? ANUCORT-HC 25 MG suppository Place 25 mg rectally at bedtime as needed for itching.    ? docusate sodium (COLACE) 100 MG capsule Take 1 capsule (100 mg total) by mouth 2 (two) times daily. 30 capsule 0  ? hydrocortisone valerate cream (WESTCORT) 0.2 % Apply 1 application topically 2 (two) times daily as needed (irritation).    ? metoprolol tartrate (LOPRESSOR) 25 MG tablet Take 1 tablet (25 mg total) by mouth daily. 90 tablet 3  ? Multiple Vitamin (MULTIVITAMIN WITH MINERALS) TABS tablet Take 1 tablet by mouth daily.    ? Omega-3 1000 MG CAPS Take 1,000 mg by mouth daily.    ? omeprazole (PRILOSEC) 20 MG capsule Take 20 mg by mouth daily.    ? OVER THE COUNTER MEDICATION Take 30 mg by mouth daily as needed (cbd tablet 30 mg). Take 1 tablet by mouth dailt    ? REPATHA SURECLICK 229 MG/ML SOAJ INJECT 140 MG INTO THE SKIN EVERY 14 DAYS 2 mL 11  ? rivaroxaban (XARELTO) 20 MG TABS tablet Take 1 tablet (20 mg total) by mouth daily with supper. 30 tablet 1  ? tiZANidine (ZANAFLEX) 2 MG tablet Take 1 tablet (2 mg total) by mouth every 8 (eight) hours as needed for muscle spasms. [name]  tablet 0  ? Wheat Dextrin (BENEFIBER DRINK MIX PO) Take 1 packet by mouth in the morning and at bedtime.    ? ?No current facility-administered medications for this visit.  ? ? ?No Known Allergies ? ?Past Medical History:  ?Diagnosis Date  ? Anxiety   ? Arthritis   ? CAD (coronary artery disease)   ? Complication of anesthesia   ? hard time to wake up  ? Coronary artery disease   ? a. 2012  s/p prior DES x 2 to the LAD;  b. 05/2015 nl stress test [facility]);  c. 6/20217 Cath: LM nl, LAD patent stents, LCX/RCA nl, EF 55-65%.  ? DDD (degenerative disc disease)   ? Depression   ? DVT (deep venous thrombosis) ([place])   ? Dyspnea on exertion   ? a. has been seen by pulmonology - reportedly told that everything nl.  ?  Dysrhythmia   ? Afib  ? Factor V deficiency ([place])   ? GERD (gastroesophageal reflux disease)   ? Hypercholesteremia   ? Hypertension   ? Hypertensive heart disease   ? Neuromuscular disorder ([place])   ? neuropathy feet  ? Nocturia   ? Occipital headache   ? sight changes without migraines, for over 30  years  ? Pre-diabetes   ? Pulmonary embolism ([place])   ? Scoliosis   ? Sleep apnea   ? mild,  no cpap could not tolerate  ? Tinnitus   ? ? ?Blood pressure 128/86, pulse 91, resp. rate 17, height 5' 10.5" (1.791 m), weight 169 lb 12.8 oz (77 kg), SpO2 96 %. ? ?Hypertensive heart disease ?Patient with diastolic hypertension, not currently on only metoprolol tart 25 mg bid.  Patient more interested in lifestyle modification, although somewhat limited by slow recovering TKA.  Advised patient to continue monitoring home BP several times each week.  Should the diastolic reading continue to remain > 80 would advise that he needs ACEI/ARB or chlorthalidone for diastolic reduction.  He will follow up with PCP later this week.  ? ?Other pulmonary embolism without acute cor pulmonale (HCC) ?On lifelong anticoagulation for VTE, due to repeat events.  Was off Xarelto x 4 days for recent TKA, pulmonary embolism occurred within 1 week of surgery.   Would need to consider Lovenox bridge for any future anticoagulation holds.  Just finished Xarelto 15 mg bid dosing and now back to 20 mg daily.   ? ? ?Tommy Medal PharmD CPP [facility] ?[place] [address] ?[place], [place] [address] ?[phone] ?

## 2021-06-21 NOTE — Patient Instructions (Addendum)
Thank you for choosing [facility]. ? ?Take your blood pressure cuff with you to your MD appointment later this week - they can check to make sure it is accurate.  If so, please check your BP at home 2-3 times per week.  If you see the bottom number (diastolic) staying above 80, we may consider medication.   ? ? HOW TO TAKE YOUR BLOOD PRESSURE: ?Rest 5 minutes before taking your blood pressure. ? Don?t smoke or drink caffeinated beverages for at least 30 minutes before. ?Take your blood pressure before (not after) you eat. ?Sit comfortably with your back supported and both feet on the floor (don?t cross your legs). ?Elevate your arm to heart level on a table or a desk. ?Use the proper sized cuff. It should fit smoothly and snugly around your bare upper arm. There should be enough room to slip a fingertip under the cuff. The bottom edge of the cuff should be 1 inch above the crease of the elbow. ?Ideally, take 3 measurements at one sitting and record the average. ?  ?

## 2021-06-22 ENCOUNTER — Encounter: Payer: Self-pay | Admitting: Pharmacist Clinician (PhC)/ Clinical Pharmacy Specialist

## 2021-06-22 NOTE — Assessment & Plan Note (Signed)
Patient with diastolic hypertension, not currently on only metoprolol tart 25 mg bid.  Patient more interested in lifestyle modification, although somewhat limited by slow recovering TKA.  Advised patient to continue monitoring home BP several times each week.  Should the diastolic reading continue to remain > 80 would advise that he needs ACEI/ARB or chlorthalidone for diastolic reduction.  He will follow up with PCP later this week.  ?

## 2021-06-22 NOTE — Assessment & Plan Note (Signed)
On lifelong anticoagulation for VTE, due to repeat events.  Was off Xarelto x 4 days for recent TKA, pulmonary embolism occurred within 1 week of surgery.   Would need to consider Lovenox bridge for any future anticoagulation holds.  Just finished Xarelto 15 mg bid dosing and now back to 20 mg daily.   ?

## 2021-07-07 ENCOUNTER — Telehealth: Payer: Self-pay | Admitting: Cardiovascular Disease

## 2021-07-07 DIAGNOSIS — R0600 Dyspnea, unspecified: Secondary | ICD-10-CM

## 2021-07-07 DIAGNOSIS — I2699 Other pulmonary embolism without acute cor pulmonale: Secondary | ICD-10-CM

## 2021-07-07 NOTE — Telephone Encounter (Signed)
Lorretta Harp, MD  You 20 minutes ago (4:19 PM)  ? ?That's fine. OK to refer to Dr Vaughan Browner or [facility]   ? ?Referral placed. Patient notified. Provided phone # for this office to call if he has not heard something by early next week.  ?

## 2021-07-07 NOTE — Telephone Encounter (Signed)
New Message: ? ? ? ?Patient says he would like for Dr Gwenlyn Found to refer him to a Pulmonary doctor asap please. ?

## 2021-07-07 NOTE — Telephone Encounter (Signed)
Returned call to patient of Dr. Gwenlyn Found  ?He reports he would like a referral to pulmonary as he had PE, DVT after knee surgery  ?He reports his breathing has not recovered ?His strength/balance has not recovered  ? ?He would like to see pulmonologist for follow up of this.  ?

## 2021-07-08 ENCOUNTER — Telehealth: Payer: Self-pay | Admitting: Pulmonary Disease

## 2021-07-08 NOTE — Telephone Encounter (Signed)
Spoke with pt who states he had knee surgery in March and developed 2 DVTs. Pt was was originally scheduled on May 22 for a consult with Dr. Vaughan Browner as he has never been seen in our office before. Pt states that he has addressed his increased of fatigue and SOB with his PCP and Cardiologist who have advised that it will take some time for clots to resolve and pt did state currently take [place]. Pt felt like he could not wait until 08/09/21 consult so he request first available consult which was with Dr. Verlee Monte on 07/28/21. Pt agreed and was rescheduled for consult with Dr. Verlee Monte. Pt advised is SOB became worse to seek emergency medical evaluation. Nothing further needed at this point.  ? ?Routing to Dr. Verlee Monte as Juluis Rainier ?

## 2021-07-13 ENCOUNTER — Telehealth: Payer: Self-pay | Admitting: Cardiovascular Disease

## 2021-07-13 NOTE — Telephone Encounter (Signed)
-  Pt called to report increased SOB and feel like heart is racing  with exertion  ?-He state he was seen in the ER last Thursday for similar symptoms, but felt better on Friday. However, he report symptoms reoccurred on Saturday and feels like its worsened.  ?-Pt audibly SOB on the phone and taking breaths between words ?-Pt denies swelling or significant weight increase. ? ? ?Based on current symptoms, nurse recommended pt report to ER for further evaluations.  ?

## 2021-07-13 NOTE — Telephone Encounter (Signed)
Pt c/o Shortness Of Breath: STAT if SOB developed within the last 24 hours or pt is noticeably SOB on the phone ? ?1. Are you currently SOB (can you hear that pt is SOB on the phone)? Yes, can hear over the phone ? ?2. How long have you been experiencing SOB? Has been having for several weeks has gotten worse since Saturday  ? ?3. Are you SOB when sitting or when up moving around? More so when moving around  ? ?4. Are you currently experiencing any other symptoms? Elevated HR when moving around. BP 154/100 HR 85 while on the phone.  ? ? ?

## 2021-07-16 ENCOUNTER — Ambulatory Visit: Payer: Medicare Other | Admitting: Physician Assistant

## 2021-07-16 ENCOUNTER — Encounter: Payer: Self-pay | Admitting: Physician Assistant

## 2021-07-16 VITALS — BP 120/100 | HR 102 | Ht 70.5 in | Wt 162.6 lb

## 2021-07-16 DIAGNOSIS — I10 Essential (primary) hypertension: Secondary | ICD-10-CM | POA: Diagnosis not present

## 2021-07-16 DIAGNOSIS — I2510 Atherosclerotic heart disease of native coronary artery without angina pectoris: Secondary | ICD-10-CM | POA: Diagnosis not present

## 2021-07-16 DIAGNOSIS — R7303 Prediabetes: Secondary | ICD-10-CM

## 2021-07-16 DIAGNOSIS — E785 Hyperlipidemia, unspecified: Secondary | ICD-10-CM

## 2021-07-16 DIAGNOSIS — I2699 Other pulmonary embolism without acute cor pulmonale: Secondary | ICD-10-CM | POA: Diagnosis not present

## 2021-07-16 MED ORDER — REPATHA SURECLICK 140 MG/ML ~~LOC~~ SOAJ
SUBCUTANEOUS | 11 refills | Status: DC
Start: 2021-07-16 — End: 2022-06-27

## 2021-07-16 NOTE — Patient Instructions (Signed)
Medication Instructions:  ?Your physician recommends that you continue on your current medications as directed. Please refer to the Current Medication list given to you today. ? ?*If you need a refill on your cardiac medications before your next appointment, please call your pharmacy* ? ?Lab Work: ?NONE ordered at this time of appointment  ? ?If you have labs (blood work) drawn today and your tests are completely normal, you will receive your results only by: ?MyChart Message (if you have MyChart) OR ?A paper copy in the mail ?If you have any lab test that is abnormal or we need to change your treatment, we will call you to review the results. ? ?Testing/Procedures: ?NONE ordered at this time of appointment  ? ?Follow-Up: ?At [facility], you and your health needs are our priority.  As part of our continuing mission to provide you with exceptional heart care, we have created designated Provider Care Teams.  These Care Teams include your primary Cardiologist (physician) and Advanced Practice Providers (APPs -  Physician Assistants and Nurse Practitioners) who all work together to provide you with the care you need, when you need it. ? ?  ?Your next appointment:   ?6 week(s) ? ?The format for your next appointment:   ?In Person ? ?Provider:   ?Almyra Deforest, PA-C      ? ? ?Other Instructions ? ? ?Important Information About Sugar ? ? ? ? ? ? ?
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
## 2021-07-18 ENCOUNTER — Encounter: Payer: Self-pay | Admitting: Physician Assistant

## 2021-07-26 ENCOUNTER — Ambulatory Visit (INDEPENDENT_AMBULATORY_CARE_PROVIDER_SITE_OTHER): Payer: Medicare Other

## 2021-07-26 ENCOUNTER — Encounter: Payer: Self-pay | Admitting: Family Medicine

## 2021-07-26 ENCOUNTER — Ambulatory Visit: Payer: Self-pay

## 2021-07-26 ENCOUNTER — Ambulatory Visit (INDEPENDENT_AMBULATORY_CARE_PROVIDER_SITE_OTHER): Payer: Medicare Other | Admitting: Family Medicine

## 2021-07-26 VITALS — BP 128/80 | HR 89 | Ht 70.5 in | Wt 163.6 lb

## 2021-07-26 DIAGNOSIS — M1712 Unilateral primary osteoarthritis, left knee: Secondary | ICD-10-CM

## 2021-07-26 DIAGNOSIS — M25562 Pain in left knee: Secondary | ICD-10-CM | POA: Diagnosis not present

## 2021-07-26 DIAGNOSIS — R0609 Other forms of dyspnea: Secondary | ICD-10-CM

## 2021-07-26 DIAGNOSIS — M545 Low back pain, unspecified: Secondary | ICD-10-CM | POA: Diagnosis not present

## 2021-07-26 DIAGNOSIS — G8929 Other chronic pain: Secondary | ICD-10-CM | POA: Diagnosis not present

## 2021-07-26 DIAGNOSIS — M5136 Other intervertebral disc degeneration, lumbar region: Secondary | ICD-10-CM | POA: Diagnosis not present

## 2021-07-26 DIAGNOSIS — G2581 Restless legs syndrome: Secondary | ICD-10-CM

## 2021-07-26 MED ORDER — ROPINIROLE HCL 0.25 MG PO TABS: 0.2500 mg | ORAL_TABLET | Freq: Every day | ORAL | 1 refills | Status: DC

## 2021-07-26 NOTE — Progress Notes (Signed)
? ?I, [name], LAT, ATC, am serving as scribe for Dr. [name]. ? ?Nathan Rodriguez is a 76 y.o. male who presents to [place] at [facility] today for bilat knee pain. Pt was previously seen by Dr. Glennon Mac on 04/05/21 for this complaint and had B Durolane injections on 01/27/21. Pt has a R TKR on 05/21/21 by Dr. Berenice Primas. Today, pt reports that he's having increased low back pain that he contributes mainly to be laid up after his R TKR w/ complications of PE after surgery.  He would like to know if he can get another ESI for his back pain.  He also notes that he would like to discuss his L knee to determine what treatment options he has for that as he is not wanting to do another surgery due to his experience w/ his R knee TKR. ? ?Additionally he notes that he is having trouble sleeping.  He notes difficulty with feeling that he has to move and shift his legs and his whole body at night.  He finds this very uncomfortable and obnoxious and is interfering with sleep significantly.  He is done some eating and thinks it is restless leg syndrome. ? ?Dx imaging: 05/27/21 R knee XR ? 09/14/17 L knee XR ? ?Pertinent review of systems: No fevers or chills ? ?Relevant historical information: History of recent pulmonary embolism ? ? ?Exam:  ?BP 128/80 (BP Location: Right Arm, Patient Position: Sitting, Cuff Size: Normal)   Pulse 89   Ht 5' 10.5" (1.791 m)   Wt 163 lb 9.6 oz (74.2 kg)   SpO2 98%   BMI 23.14 kg/m?  ?General: Well Developed, well nourished, and in no acute distress.  ? ?MSK: L-spine: Nontender midline.  Decreased lumbar motion.  Lower extremity strength generally intact. ?Slow gait. ?Reflexes are intact. ? ?Left knee: Moderate effusion. ?Normal motion with crepitation. ?Stable ligamentous exam. ?Intact strength. ? ? ? ?Lab and Radiology Results ? ?Procedure: Real-time Ultrasound Guided Injection of left knee superior lateral patellar space ?Device: Philips Affiniti 50G ?Images  permanently stored and available for review in PACS ?Verbal informed consent obtained.  Discussed risks and benefits of procedure. Warned about infection, bleeding, hyperglycemia damage to structures among others. ?Patient expresses understanding and agreement ?Time-out conducted.   ?Noted no overlying erythema, induration, or other signs of local infection.   ?Skin prepped in a sterile fashion.   ?Local anesthesia: Topical Ethyl chloride.   ?With sterile technique and under real time ultrasound guidance: 40 mg of Kenalog and 2 mL of Marcaine injected into knee joint. Fluid seen entering the joint capsule.   ?Completed without difficulty   ?Pain immediately resolved suggesting accurate placement of the medication.   ?Advised to call if fevers/chills, erythema, induration, drainage, or persistent bleeding.   ?Images permanently stored and available for review in the ultrasound unit.  ?Impression: Technically successful ultrasound guided injection. ? ? 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 ?Calcification present within distal quad tendon consistent with calcific tendinopathy. ?Await formal radiology review ? ?EXAM: ?MRI LUMBAR SPINE WITHOUT CONTRAST ?  ?TECHNIQUE: ?Multiplanar, multisequence MR imaging of the lumbar spine was ?performed. No intravenous contrast was administered. ?  ?COMPARISON:  Lumbar spine radiographs 04/18/2018, report for lumbar ?spine MRI 07/05/2003 (images unavailable) ?  ?FINDINGS: ?Segmentation: For the purposes of this dictation, five lumbar ?vertebrae are assumed and the caudal most well-formed intervertebral ?disc is designated L5-S1. ?  ?  Alignment: Lumbar dextrocurvature. Trace retrolisthesis at the ?T12-L1 through L2-L3 levels. Trace L4-L5 anterolisthesis. ?  ?Vertebrae: Vertebral body height is maintained. Multilevel ?degenerative endplate irregularity. Multilevel  degenerative endplate ?edema which is prominent L2-L3. Ventral osteophytes. ?  ?Conus medullaris and cauda equina: Conus extends to the L1-2 level. ?Conus and cauda equina appear normal. ?  ?Paraspinal and other soft tissues: The imaged lumbar paraspinal ?muscles are unremarkable. Incompletely assessed right lower pole ?renal cyst. ?  ?Disc levels: ?  ?T12-L1: Moderate disc degeneration. Small disc bulge asymmetric to ?the right with associated osteophyte ridge. Mild right subarticular ?and right neural foraminal narrowing. No significant central canal ?stenosis. ?  ?L1-L2: Moderate disc degeneration. Disc bulge with osteophyte ridge. ?Facet arthrosis/ligamentum flavum hypertrophy. Mild central canal ?and bilateral subarticular narrowing. Mild bilateral neural ?foraminal narrowing. ?  ?L2-L3: Severe disc degeneration. Disc bulge with osteophyte ridge. ?Facet arthrosis/ligamentum flavum hypertrophy. Moderate central ?canal stenosis and left subarticular narrowing. Moderate/severe left ?neural foraminal narrowing. ?  ?L3-L4: Disc bulge. Facet arthrosis/ligamentum flavum hypertrophy. ?Mild left subarticular and central canal narrowing. Mild/moderate ?left neural foraminal narrowing. ?  ?L4-L5: Small disc bulge. Facet arthrosis/ligamentum flavum ?hypertrophy. Mild central canal and bilateral subarticular ?narrowing. Mild bilateral neural foraminal narrowing. ?  ?L5-S1: Disc bulge asymmetric to the right with osteophyte ridge. ?Facet arthrosis/ligamentum flavum hypertrophy. No significant spinal ?canal stenosis. Moderate/severe right with mild left neural ?foraminal narrowing. ?  ?IMPRESSION: ?Lumbar spondylosis as detailed. At L2-L3, there is severe disc ?degeneration with prominent degenerative endplate edema. Moderate ?central canal and left subarticular narrowing at this level, as well ?as moderate/severe left neural foraminal narrowing. ?  ?No more than mild spinal canal stenosis at the remaining levels. ?Additional  sites of neural foraminal narrowing, as described, and ?greatest on the left at L5-S1 (moderate/severe at this site). ?  ?  ?Electronically Signed ?  By: Kellie Simmering ?  On: 10/23/2018 10:27 ?I, [name], personally (independently) visualized and performed the interpretation of the images attached in this note. ? ? ? ?Assessment and Plan: ?76 y.o. male with  ?Chronic back pain.  Patient is experiencing an acute exacerbation of his chronic low back pain.  He has significant degenerative changes present on prior L-spine imaging.  However I believe his acute exacerbation is due to muscle spasm and dysfunction caused by muscle atrophy secondary to his recent knee replacement and hospitalization due to his pulmonary embolism.  I think with some core strengthening he probably will improve quite a bit.  He will need a great physical therapist.  He is suffering from deconditioning and a dedicated one-on-one attention with physical therapy will be essential. ? ?Additionally he has left knee pain.  This is secondary to DJD.  Plan for steroid injection today and physical therapy.  Also work on authorization of hyaluronic acid injections.  He last had Durolane injection about 6 months ago.  Will be ready to repeat this injection series when needed in the future.  Based on his experience with his right knee replacement I do not think he is a good candidate for a left knee replacement. ? ?Probable restless leg syndrome.  I agree.  His symptoms are most consistent with restless leg syndrome.  We will treat with Requip at low-dose and recheck in 1 month.  Adjust from there. ? ? ?PDMP not reviewed this encounter. ?Orders Placed This Encounter  ?Procedures  ? [place] LIMITED JOINT SPACE STRUCTURES LOW LEFT(NO LINKED CHARGES)  ?  Order Specific Question:   Reason for Exam (SYMPTOM  OR DIAGNOSIS REQUIRED)  ?  Answer:   L knee pain  ?  Order Specific Question:   Preferred imaging location?  ?  Answer:   [place]   ? DG Knee AP/LAT W/[place] Left  ?  Standing Status:   Future  ?  Number of Occurrences:   1  ?  Standing Expiration Date:   08/26/2021  ?  Order Specific Question:   Reason for Exam (SYMPTOM  OR DIAGNOSIS REQUIRED)
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
## 2021-07-26 NOTE — Patient Instructions (Addendum)
Good to see you today. ? ?You had a L knee injection.  Call or go to the ER if you develop a large red swollen joint with extreme pain or oozing puss.  ? ?I've referred you to Physical Therapy.  Their office will call you to schedule but please let us know if you don't hear from them in one week regarding scheduling. ? ?Please get an Xray today before you leave. ? ?Follow-up: one month ? ? ?

## 2021-07-27 DIAGNOSIS — G2581 Restless legs syndrome: Secondary | ICD-10-CM | POA: Insufficient documentation

## 2021-07-28 ENCOUNTER — Institutional Professional Consult (permissible substitution): Payer: Medicare Other | Admitting: Student

## 2021-07-28 ENCOUNTER — Telehealth: Payer: Self-pay | Admitting: Cardiovascular Disease

## 2021-07-28 NOTE — Telephone Encounter (Signed)
Apply for patient assistance? ?

## 2021-07-28 NOTE — Telephone Encounter (Signed)
Patient wants renewal for the Health Well Program for Rep[place]. ?

## 2021-07-28 NOTE — Progress Notes (Signed)
Left wrist x-ray shows mild arthritis.

## 2021-07-28 NOTE — Telephone Encounter (Signed)
Lmom the pt that the healthwell grant was closed and that we need to have them fill out pt assistance for the repatha. Forms mailed to pt  ?

## 2021-07-28 NOTE — Telephone Encounter (Signed)
Pt would like nurse to give him a call back regarding renewal for Health Well Program. Please advise ?

## 2021-07-29 NOTE — Progress Notes (Signed)
My apologies.  I listed the incorrect body part.  The left knee shows mild arthritis.

## 2021-08-04 ENCOUNTER — Telehealth: Payer: Self-pay | Admitting: *Deleted

## 2021-08-04 NOTE — Telephone Encounter (Signed)
? ?  Pre-operative Risk Assessment  ?  ?Patient Name: Nathan Rodriguez  ?DOB: 08/25/1945 ?MRN: [id number]  ? ? ? ?Request for Surgical Clearance   ? ?Procedure:   crown and fillings ? ?Date of Surgery:  Clearance TBD                              ?   ?Surgeon:   ?Surgeon's Group or Practice Name:  [facility] ?Phone number:  [phone] ?Fax number:  [phone] ?  ?Type of Clearance Requested:   ?- Medical  ?  ?Type of Anesthesia:  Not Indicated ?  ?Additional requests/questions:  Does this patient need antibiotics? ?Please advise surgeon/provider what medications should be held. ? ?Signed, ?Dreux Mcgroarty A Dontel Harshberger   ?08/04/2021, 2:44 PM  ? ?

## 2021-08-05 NOTE — Telephone Encounter (Signed)
   Patient Name: Nathan Rodriguez  DOB: 20-Jun-1945 MRN: [id number]  Primary Cardiologist: Quay Burow, MD  Chart reviewed as part of pre-operative protocol coverage.  Patient has f/u as soon as tomorrow with Diona Browner, NP. Though crown/fillings are relatively low risk, given recent hospitalization/cardiac issues (see Hao's note 07/16/21), seems this clearance can best be discussed in person in follow-up visit. Added preop clearance to appt notes and will route to Raquel Sarna so she is aware.  Charlie Pitter, PA-C 08/05/2021, 1:23 PM

## 2021-08-06 ENCOUNTER — Ambulatory Visit: Payer: Medicare Other | Admitting: Nurse Practitioner

## 2021-08-06 ENCOUNTER — Encounter: Payer: Self-pay | Admitting: Nurse Practitioner

## 2021-08-06 VITALS — BP 134/82 | HR 79 | Resp 20 | Ht 70.0 in | Wt 164.0 lb

## 2021-08-06 DIAGNOSIS — Z01810 Encounter for preprocedural cardiovascular examination: Secondary | ICD-10-CM

## 2021-08-06 DIAGNOSIS — I82432 Acute embolism and thrombosis of left popliteal vein: Secondary | ICD-10-CM | POA: Diagnosis not present

## 2021-08-06 DIAGNOSIS — I350 Nonrheumatic aortic (valve) stenosis: Secondary | ICD-10-CM

## 2021-08-06 DIAGNOSIS — I10 Essential (primary) hypertension: Secondary | ICD-10-CM

## 2021-08-06 DIAGNOSIS — I2510 Atherosclerotic heart disease of native coronary artery without angina pectoris: Secondary | ICD-10-CM

## 2021-08-06 DIAGNOSIS — I2699 Other pulmonary embolism without acute cor pulmonale: Secondary | ICD-10-CM

## 2021-08-06 DIAGNOSIS — D6851 Activated protein C resistance: Secondary | ICD-10-CM

## 2021-08-06 DIAGNOSIS — E785 Hyperlipidemia, unspecified: Secondary | ICD-10-CM

## 2021-08-06 DIAGNOSIS — R7303 Prediabetes: Secondary | ICD-10-CM
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
## 2021-08-06 NOTE — Patient Instructions (Signed)
Medication Instructions:  Your physician recommends that you continue on your current medications as directed. Please refer to the Current Medication list given to you today.  *If you need a refill on your cardiac medications before your next appointment, please call your pharmacy*   Lab Work: NONE ordered at this time of appointment   If you have labs (blood work) drawn today and your tests are completely normal, you will receive your results only by: MyChart Message (if you have MyChart) OR A paper copy in the mail If you have any lab test that is abnormal or we need to change your treatment, we will call you to review the results.   Testing/Procedures: NONE ordered at this time of appointment     Follow-Up: At [facility], you and your health needs are our priority.  As part of our continuing mission to provide you with exceptional heart care, we have created designated Provider Care Teams.  These Care Teams include your primary Cardiologist (physician) and Advanced Practice Providers (APPs -  Physician Assistants and Nurse Practitioners) who all work together to provide you with the care you need, when you need it.  We recommend signing up for the patient portal called "MyChart".  Sign up information is provided on this After Visit Summary.  MyChart is used to connect with patients for Virtual Visits (Telemedicine).  Patients are able to view lab/test results, encounter notes, upcoming appointments, etc.  Non-urgent messages can be sent to your provider as well.   To learn more about what you can do with MyChart, go to [email or web address].    Your next appointment:   6 month(s)  The format for your next appointment:   In Person  Provider:   Jonathan Berry, MD     Other Instructions   Important Information About Sugar        

## 2021-08-09 ENCOUNTER — Institutional Professional Consult (permissible substitution): Payer: Medicare Other | Admitting: Pulmonary Disease

## 2021-08-10 ENCOUNTER — Ambulatory Visit (INDEPENDENT_AMBULATORY_CARE_PROVIDER_SITE_OTHER): Payer: Medicare Other | Admitting: Family Medicine

## 2021-08-10 ENCOUNTER — Ambulatory Visit: Payer: Self-pay

## 2021-08-10 ENCOUNTER — Encounter: Payer: Self-pay | Admitting: Family Medicine

## 2021-08-10 VITALS — BP 120/78 | HR 63 | Ht 70.0 in | Wt 164.4 lb

## 2021-08-10 DIAGNOSIS — M1712 Unilateral primary osteoarthritis, left knee: Secondary | ICD-10-CM | POA: Diagnosis not present

## 2021-08-10 DIAGNOSIS — M25562 Pain in left knee: Secondary | ICD-10-CM

## 2021-08-10 DIAGNOSIS — G8929 Other chronic pain: Secondary | ICD-10-CM

## 2021-08-10 DIAGNOSIS — M545 Low back pain, unspecified: Secondary | ICD-10-CM

## 2021-08-10 MED ORDER — TIZANIDINE HCL 2 MG PO TABS: 2.0000 mg | ORAL_TABLET | Freq: Three times a day (TID) | ORAL | 2 refills | Status: DC | PRN

## 2021-08-10 NOTE — Progress Notes (Signed)
I, Nathan Rodriguez, LAT, ATC, am serving as scribe for Dr. [name].  Nathan Rodriguez is a 76 y.o. male who presents to [place] [facility] today for f/u LBP and bilat knee pain. Pt was last seen by [name] on 07/26/21 and he was prescribed Requip, given a L knee steroid injection, was referred to PT, but did not schedule/complete any visits. Pt was previously seen by Dr. Glennon Mac on 04/05/21 for this complaint and had B Durolane injections on 01/27/21. Pt had a R TKR on 05/21/21 by Dr. Berenice Primas and pt suffered PE complications post-op. Today, pt reports that his lower back pain persists and is his most problematic issue.  He has his 1st PT visit scheduled on June 1st.  He notes that his back is most painful and tight first thing in the morning.  He has been taking his leftover Tizanidine '2mg'$  from after his knee surgery but needs a refill.  Takes the tizanidine in the morning when his back pain is the worst.  He tolerates this well and it does not make him feel sedated.  Dx imaging: 07/26/21 L knee XR 05/27/21 R knee XR             09/14/17 L knee XR  Pertinent review of systems: No fevers or chills  Relevant historical information: History of pulmonary embolism   Exam:  BP 120/78 (BP Location: Right Arm, Patient Position: Sitting, Cuff Size: Normal)   Pulse 63   Ht '5\' 10"'$  (1.778 m)   Wt 164 lb 6.4 oz (74.6 kg)   SpO2 98%   BMI 23.59 kg/m  General: Well Developed, well nourished, and in no acute distress.   MSK: L-spine: Decreased lumbar motion. Left knee minimal effusion normal motion with crepitation.    Lab and Radiology Results  Procedure: Real-time Ultrasound Guided Injection of left knee superior lateral patellar space Device: Philips Affiniti 50G Images permanently stored and available for review in PACS Verbal informed consent obtained.  Discussed risks and benefits of procedure. Warned about infection, bleeding, damage to structures among others. Patient  expresses understanding and agreement Time-out conducted.   Noted no overlying erythema, induration, or other signs of local infection.   Skin prepped in a sterile fashion.   Local anesthesia: Topical Ethyl chloride.   With sterile technique and under real time ultrasound guidance: Durolane 3 mL injected into knee joint. Fluid seen entering the joint capsule.   Completed without difficulty   Advised to call if fevers/chills, erythema, induration, drainage, or persistent bleeding.   Images permanently stored and available for review in the ultrasound unit.  Impression: Technically successful ultrasound guided injection. Lot number: [id number]       Assessment and Plan: 76 y.o. male with left knee pain due to DJD.  Plan for Durolane injection and physical therapy.  Back pain: Due to DJD muscle spasm and deconditioning.  Plan for physical therapy already ordered.  We will refill tizanidine.  Recheck in 6 weeks.   PDMP not reviewed this encounter. Orders Placed This Encounter  Procedures   [place] LIMITED JOINT SPACE STRUCTURES LOW LEFT(NO LINKED CHARGES)    Order Specific Question:   Reason for Exam (SYMPTOM  OR DIAGNOSIS REQUIRED)    Answer:   knee inj    Order Specific Question:   Preferred imaging location?    Answer:   [place]   Meds ordered this encounter  Medications   tiZANidine (ZANAFLEX) 2 MG tablet  Sig: Take 1 tablet (2 mg total) by mouth every 8 (eight) hours as needed for muscle spasms.    Dispense:  30 tablet    Refill:  2     Discussed warning signs or symptoms. Please see discharge instructions. Patient expresses understanding.   The above documentation has been reviewed and is accurate and complete [name], M.D.

## 2021-08-10 NOTE — Patient Instructions (Addendum)
Good to see you today.  You had a L knee Durolane injection.  Call or go to the ER if you develop a large red swollen joint with extreme pain or oozing puss.   I've prescribed you Tizanidine.  Follow-up: 6 weeks

## 2021-08-17 ENCOUNTER — Telehealth: Payer: Self-pay | Admitting: Cardiovascular Disease

## 2021-08-17 NOTE — Telephone Encounter (Signed)
Returned call to patient of Dr. Gwenlyn Found. He received application in the mail that [facility] sent him. He received a call from [facility] - was told app was in process. He was told that prior Josem Kaufmann is required from [facility]. May need to fax approval to [employer]  He will be due for injection next week -- may need a sample  If any more info requested from patient, please call him  Advised will route to [place]

## 2021-08-17 NOTE — Telephone Encounter (Signed)
Pt would like a callback regarding Pt Assistance application. Please advise

## 2021-08-17 NOTE — Telephone Encounter (Signed)
Returned a call to pt to state that we are out of repatha samples and to try calling back next week and the pt voiced understanding. Also I mentioned that I would include the pa approval letter in a fax to [place].

## 2021-08-19 ENCOUNTER — Ambulatory Visit: Payer: Medicare Other | Attending: Family Medicine | Admitting: Physical Therapy

## 2021-08-19 DIAGNOSIS — R2689 Other abnormalities of gait and mobility: Secondary | ICD-10-CM | POA: Diagnosis present

## 2021-08-19 DIAGNOSIS — M1712 Unilateral primary osteoarthritis, left knee: Secondary | ICD-10-CM | POA: Insufficient documentation

## 2021-08-19 DIAGNOSIS — M5136 Other intervertebral disc degeneration, lumbar region: Secondary | ICD-10-CM | POA: Diagnosis not present

## 2021-08-19 DIAGNOSIS — R293 Abnormal posture: Secondary | ICD-10-CM | POA: Diagnosis present

## 2021-08-19 DIAGNOSIS — M6281 Muscle weakness (generalized): Secondary | ICD-10-CM | POA: Diagnosis present

## 2021-08-19 DIAGNOSIS — G8929 Other chronic pain: Secondary | ICD-10-CM | POA: Diagnosis present

## 2021-08-19 DIAGNOSIS — M25562 Pain in left knee: Secondary | ICD-10-CM | POA: Diagnosis present

## 2021-08-19 DIAGNOSIS — M5459 Other low back pain: Secondary | ICD-10-CM | POA: Diagnosis present
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
## 2021-08-24 ENCOUNTER — Ambulatory Visit: Payer: Medicare Other | Admitting: Cardiovascular Disease

## 2021-08-25 ENCOUNTER — Ambulatory Visit: Payer: Medicare Other | Admitting: Physical Therapy

## 2021-08-25 ENCOUNTER — Other Ambulatory Visit: Payer: Self-pay

## 2021-08-25 ENCOUNTER — Encounter: Payer: Self-pay | Admitting: Physical Therapy

## 2021-08-25 DIAGNOSIS — M5459 Other low back pain: Secondary | ICD-10-CM | POA: Diagnosis not present

## 2021-08-25 DIAGNOSIS — G8929 Other chronic pain: Secondary | ICD-10-CM

## 2021-08-25 DIAGNOSIS — M6281 Muscle weakness (generalized): Secondary | ICD-10-CM

## 2021-08-25 DIAGNOSIS — R293 Abnormal posture: Secondary | ICD-10-CM

## 2021-08-25 DIAGNOSIS — R2689 Other abnormalities of gait and mobility: Secondary | ICD-10-CM

## 2021-08-25 NOTE — Patient Instructions (Signed)
Access Code: [email or web address] URL: [email or web address] Date: 08/25/2021 Prepared by: Almyra Free  Exercises - Seated Hamstring Stretch  - 1 x daily - 7 x weekly - 2 sets - 30 sec hold - Seated Hip Flexor Stretch  - 1 x daily - 7 x weekly - 2 sets - 30 sec hold - Hip Abduction with Resistance Loop  - 1 x daily - 7 x weekly - 2 sets - 10 reps - Seated March  - 1 x daily - 7 x weekly - 2 sets - 10 reps - Standing Glute Med Mobilization with Small Ball on Wall  - 1 x daily - 7 x weekly - 60 sec hold - Standing Quadratus Lumborum Mobilization with Small Ball on Wall  - 1 x daily - 7 x weekly - 3 sets - 60 sec hold - Hip Extension with Resistance Loop  - 1 x daily - 7 x weekly - 2 sets - 10 reps - Marching with Resistance  - 1 x daily - 7 x weekly - 3 sets - 10 reps - Seated Pelvic Tilts  - 2 x daily - 7 x weekly - 1 sets - 10 reps
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
## 2021-08-26 ENCOUNTER — Ambulatory Visit: Payer: Medicare Other | Admitting: Physician Assistant

## 2021-08-30 ENCOUNTER — Ambulatory Visit: Payer: Medicare Other | Admitting: Family Medicine

## 2021-09-01 ENCOUNTER — Ambulatory Visit: Payer: Medicare Other | Admitting: Physical Therapy

## 2021-09-01 DIAGNOSIS — M6281 Muscle weakness (generalized): Secondary | ICD-10-CM

## 2021-09-01 DIAGNOSIS — M25562 Pain in left knee: Secondary | ICD-10-CM

## 2021-09-01 DIAGNOSIS — R293 Abnormal posture: Secondary | ICD-10-CM

## 2021-09-01 DIAGNOSIS — M5459 Other low back pain: Secondary | ICD-10-CM | POA: Diagnosis not present

## 2021-09-01 DIAGNOSIS — R2689 Other abnormalities of gait and mobility: Secondary | ICD-10-CM
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
## 2021-09-08 ENCOUNTER — Ambulatory Visit: Payer: Medicare Other | Admitting: Physical Therapy

## 2021-09-08 DIAGNOSIS — R293 Abnormal posture: Secondary | ICD-10-CM

## 2021-09-08 DIAGNOSIS — M6281 Muscle weakness (generalized): Secondary | ICD-10-CM

## 2021-09-08 DIAGNOSIS — M5459 Other low back pain: Secondary | ICD-10-CM

## 2021-09-08 DIAGNOSIS — R2689 Other abnormalities of gait and mobility: Secondary | ICD-10-CM

## 2021-09-08 DIAGNOSIS — G8929 Other chronic pain: Secondary | ICD-10-CM
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
## 2021-09-15 ENCOUNTER — Ambulatory Visit: Payer: Medicare Other | Admitting: Physical Therapy

## 2021-09-15 DIAGNOSIS — M5459 Other low back pain: Secondary | ICD-10-CM | POA: Diagnosis not present

## 2021-09-15 DIAGNOSIS — M6281 Muscle weakness (generalized): Secondary | ICD-10-CM

## 2021-09-15 DIAGNOSIS — R293 Abnormal posture: Secondary | ICD-10-CM

## 2021-09-15 DIAGNOSIS — G8929 Other chronic pain: Secondary | ICD-10-CM

## 2021-09-15 DIAGNOSIS — R2689 Other abnormalities of gait and mobility: Secondary | ICD-10-CM
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
## 2021-09-23 ENCOUNTER — Ambulatory Visit: Payer: Medicare Other | Attending: Family Medicine | Admitting: Physical Therapy

## 2021-09-23 DIAGNOSIS — M6281 Muscle weakness (generalized): Secondary | ICD-10-CM | POA: Diagnosis present

## 2021-09-23 DIAGNOSIS — G8929 Other chronic pain: Secondary | ICD-10-CM | POA: Insufficient documentation

## 2021-09-23 DIAGNOSIS — R2689 Other abnormalities of gait and mobility: Secondary | ICD-10-CM | POA: Insufficient documentation

## 2021-09-23 DIAGNOSIS — M5459 Other low back pain: Secondary | ICD-10-CM | POA: Diagnosis present

## 2021-09-23 DIAGNOSIS — M25562 Pain in left knee: Secondary | ICD-10-CM | POA: Diagnosis present

## 2021-09-23 DIAGNOSIS — R293 Abnormal posture: Secondary | ICD-10-CM | POA: Insufficient documentation
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
## 2021-09-24 NOTE — Progress Notes (Unsigned)
    Nathan Rodriguez is a 76 y.o. male who presents to [place] [facility] today for f/u LBP and L knee pain. Pt was previously seen by Dr. Glennon Mac on 04/05/21 for this complaint and had B Durolane injections on 01/27/21. Pt had a R TKR on 05/21/21 by Dr. Berenice Primas and pt suffered PE complications post-op. Pt was last seen by [name] on 08/10/21 and was given a L knee Durolane injection, his tizanidine was refilled, and he was referred to PT, completing 6 visits. Today, pt reports  Dx imaging: 07/26/21 L knee XR 05/27/21 R knee XR             09/14/17 L knee XR  Pertinent review of systems: ***  Relevant historical information: ***   Exam:  There were no vitals taken for this visit. General: Well Developed, well nourished, and in no acute distress.   MSK: ***    Lab and Radiology Results No results found for this or any previous visit (from the past 72 hour(s)). No results found.     Assessment and Plan: 76 y.o. male with ***   PDMP not reviewed this encounter. No orders of the defined types were placed in this encounter.  No orders of the defined types were placed in this encounter.    Discussed warning signs or symptoms. Please see discharge instructions. Patient expresses understanding.   ***

## 2021-09-28 ENCOUNTER — Ambulatory Visit (INDEPENDENT_AMBULATORY_CARE_PROVIDER_SITE_OTHER): Payer: Medicare Other

## 2021-09-28 ENCOUNTER — Ambulatory Visit: Payer: Medicare Other | Admitting: Family Medicine

## 2021-09-28 VITALS — BP 130/80 | HR 68 | Ht 70.0 in | Wt 164.8 lb

## 2021-09-28 DIAGNOSIS — G8929 Other chronic pain: Secondary | ICD-10-CM

## 2021-09-28 DIAGNOSIS — M545 Low back pain, unspecified: Secondary | ICD-10-CM

## 2021-09-28 NOTE — Patient Instructions (Signed)
Thank you for coming in today.   Please get an Xray today before you leave   Continue PT.   Recheck in 3 months or sooner if needed.   We can get an MRI for injection planning sooner to October if needed.

## 2021-09-29 ENCOUNTER — Ambulatory Visit: Payer: Medicare Other | Admitting: Physical Therapy

## 2021-09-29 DIAGNOSIS — G8929 Other chronic pain: Secondary | ICD-10-CM

## 2021-09-29 DIAGNOSIS — M6281 Muscle weakness (generalized): Secondary | ICD-10-CM

## 2021-09-29 DIAGNOSIS — M5459 Other low back pain: Secondary | ICD-10-CM

## 2021-09-29 DIAGNOSIS — R293 Abnormal posture: Secondary | ICD-10-CM

## 2021-09-29 DIAGNOSIS — R2689 Other abnormalities of gait and mobility: Secondary | ICD-10-CM
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
## 2021-09-30 NOTE — Progress Notes (Signed)
X-ray lumbar spine shows multilevel degenerative changes.

## 2021-10-06 ENCOUNTER — Encounter: Payer: Self-pay | Admitting: Physical Therapy

## 2021-10-06 ENCOUNTER — Ambulatory Visit: Payer: Medicare Other | Admitting: Physical Therapy

## 2021-10-06 DIAGNOSIS — R293 Abnormal posture: Secondary | ICD-10-CM

## 2021-10-06 DIAGNOSIS — R2689 Other abnormalities of gait and mobility: Secondary | ICD-10-CM

## 2021-10-06 DIAGNOSIS — M6281 Muscle weakness (generalized): Secondary | ICD-10-CM

## 2021-10-06 DIAGNOSIS — M5459 Other low back pain: Secondary | ICD-10-CM | POA: Diagnosis not present
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
## 2021-10-13 ENCOUNTER — Ambulatory Visit: Payer: Medicare Other | Admitting: Physical Therapy

## 2021-10-13 ENCOUNTER — Encounter: Payer: Self-pay | Admitting: Physical Therapy

## 2021-10-13 DIAGNOSIS — R2689 Other abnormalities of gait and mobility: Secondary | ICD-10-CM

## 2021-10-13 DIAGNOSIS — M5459 Other low back pain: Secondary | ICD-10-CM

## 2021-10-13 DIAGNOSIS — M6281 Muscle weakness (generalized): Secondary | ICD-10-CM

## 2021-10-13 DIAGNOSIS — R293 Abnormal posture: Secondary | ICD-10-CM

## 2021-10-13 DIAGNOSIS — G8929 Other chronic pain: Secondary | ICD-10-CM
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
## 2021-10-15 ENCOUNTER — Ambulatory Visit: Payer: Medicare Other | Admitting: Physical Therapy

## 2021-10-18 ENCOUNTER — Ambulatory Visit: Payer: Medicare Other | Admitting: Physical Therapy

## 2021-10-19 ENCOUNTER — Ambulatory Visit: Payer: Medicare Other | Admitting: Physical Therapy

## 2021-10-20 ENCOUNTER — Encounter: Payer: Self-pay | Admitting: Physical Therapy

## 2021-10-22 ENCOUNTER — Telehealth: Payer: Self-pay | Admitting: Pharmacist

## 2021-10-22 NOTE — Telephone Encounter (Signed)
[employer] grant reopened and grant renewed. Called pt and LVM  CARD NO. [id number]   CARD STATUS Active   BIN [id number]   PCN [id number]   PC GROUP [id number]

## 2021-10-25 ENCOUNTER — Encounter: Payer: Self-pay | Admitting: Physical Therapy

## 2021-10-25 NOTE — Telephone Encounter (Signed)
Pt now gets medication through patient assistance.

## 2021-10-27 ENCOUNTER — Ambulatory Visit: Payer: Medicare Other | Admitting: Physical Therapy

## 2021-12-27 ENCOUNTER — Ambulatory Visit: Payer: Medicare Other | Admitting: Family Medicine

## 2021-12-27 ENCOUNTER — Ambulatory Visit (INDEPENDENT_AMBULATORY_CARE_PROVIDER_SITE_OTHER): Payer: Medicare Other

## 2021-12-27 VITALS — BP 118/82 | HR 61 | Ht 70.0 in | Wt 168.0 lb

## 2021-12-27 DIAGNOSIS — M25562 Pain in left knee: Secondary | ICD-10-CM | POA: Diagnosis not present

## 2021-12-27 DIAGNOSIS — M25512 Pain in left shoulder: Secondary | ICD-10-CM

## 2021-12-27 DIAGNOSIS — G8929 Other chronic pain: Secondary | ICD-10-CM

## 2021-12-27 NOTE — Progress Notes (Signed)
I, Peterson Lombard, LAT, ATC acting as a scribe for [name], MD.  Nathan Rodriguez is a 76 y.o. male who presents to [place] [facility] today for cont'd L knee pain and new L shoulder pain. Pt had a R TKR on 05/21/21 by Dr. Berenice Primas and pt suffered PE complications post-op. Pt was last seen by [name] on 09/28/21 for chronic LBP and pt was referred to PT. Pt has had 2 Durolane injections in his L knee, on 08/10/21 and 04/05/21. Pt had a R shoulder replacement on 10/08/20. Today, pt reports L shoulder has gradually worsening over at least the last 6 months. Pt does not feel like he can go through another joint replacement surgery. Pt locates pain to superior and posterior aspect. Pt has cont'd to bike, golf, stretch train etc. Pt notes L RC repair 10+ years ago. Pt wants to discuss the L knee and what he can do to ovoid surgery.   Neck pain: yes- into L scapular region Radiates: yes Mechanical symptoms: yes UE Numbness/tingling: no UE Weakness: no Aggravates: laying on L-side Treatments tried: Tylenol    Dx imaging: 07/26/21 L knee XR 05/27/21 R knee XR & cervical CT 10/08/20 R shoulder XR 07/29/20 R shoulder MRI             09/14/17 L knee XR   Pertinent review of systems: No fevers or chills  Relevant historical information: DVT.  History of right knee and shoulder total replacement.   Exam:  BP 118/82   Pulse 61   Ht '5\' 10"'$  (1.778 m)   Wt 168 lb (76.2 kg)   SpO2 98%   BMI 24.11 kg/m  General: Well Developed, well nourished, and in no acute distress.   MSK: Left shoulder: Normal-appearing Decreased range of motion abduction 120 degrees.  Internal rotation lumbar spine external rotation full.  Some crepitation with shoulder motion.  Strength is intact. Mildly positive Hawkins and Neer's test.  Left knee: Normal-appearing normal motion with mild crepitation.  Stable ligamentous exam.  Intact strength.    Lab and Radiology Results  X-ray images left  shoulder obtained today personally and independently interpreted Humeral head is elevated with mild to moderate degenerative changes.  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 Await formal radiology review    EXAM: LEFT KNEE 3 VIEWS   COMPARISON:  X-ray 08/25/2020; MRI 11/09/2020.   FINDINGS: Normal anatomic alignment. No evidence for acute fracture or dislocation. No joint effusion. Vascular calcifications. Mild patellofemoral joint degenerative changes.   IMPRESSION: Mild degenerative changes.  No acute process.     Electronically Signed   By: Lovey Newcomer M.D.   On: 07/27/2021 15:36   I, [name], personally (independently) visualized and performed the interpretation of the images attached in this note.    Assessment and Plan: 76 y.o. male with chronic left shoulder and knee pain.  Patient wishes to avoid total joint replacement surgery which is very reasonable.  Plan to maximize conservative management with physical therapy.  Check back in 6 weeks can consider steroid injections at any time especially during that follow-up visit.   PDMP not reviewed this encounter. Orders Placed This Encounter  Procedures   DG Shoulder Left    Standing Status:   Future    Number of Occurrences:   1    Standing Expiration Date:   12/28/2022    Order Specific Question:   Reason for Exam (SYMPTOM  OR DIAGNOSIS REQUIRED)  Answer:   left shoulder pain    Order Specific Question:   Preferred imaging location?    Answer:   [facility]   Ambulatory referral to Physical Therapy    Referral Priority:   Routine    Referral Type:   Physical Medicine    Referral Reason:   Specialty Services Required    Requested Specialty:   Physical Therapy    Number of Visits Requested:   1   No orders of the defined types were placed in this encounter.    Discussed warning signs or symptoms. Please see discharge instructions. Patient expresses understanding.   The above  documentation has been reviewed and is accurate and complete [name], M.D.

## 2021-12-27 NOTE — Patient Instructions (Signed)
Thank you for coming in today.   Please get an Xray today before you leave   I've referred you to Physical Therapy.  Let us know if you don't hear from them in one week.   Return as needed.

## 2021-12-28 NOTE — Progress Notes (Signed)
Left shoulder x-ray shows some mild shoulder arthritis.  The pulse is a little high in the socket which tells [place] there is probably a chronic rotator cuff tear.  Your left shoulder does not look as bad as the right shoulder did.  Worthwhile proceeding with physical therapy.

## 2022-01-04 ENCOUNTER — Ambulatory Visit: Payer: Medicare Other | Attending: Family Medicine | Admitting: Physical Therapy

## 2022-01-04 ENCOUNTER — Encounter: Payer: Self-pay | Admitting: Physical Therapy

## 2022-01-04 DIAGNOSIS — M25562 Pain in left knee: Secondary | ICD-10-CM | POA: Diagnosis present

## 2022-01-04 DIAGNOSIS — M6281 Muscle weakness (generalized): Secondary | ICD-10-CM | POA: Insufficient documentation

## 2022-01-04 DIAGNOSIS — R293 Abnormal posture: Secondary | ICD-10-CM | POA: Insufficient documentation

## 2022-01-04 DIAGNOSIS — G8929 Other chronic pain: Secondary | ICD-10-CM | POA: Insufficient documentation

## 2022-01-04 DIAGNOSIS — M25512 Pain in left shoulder: Secondary | ICD-10-CM | POA: Insufficient documentation

## 2022-01-04 DIAGNOSIS — M542 Cervicalgia: Secondary | ICD-10-CM | POA: Diagnosis present
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
## 2022-01-13 ENCOUNTER — Ambulatory Visit: Payer: Medicare Other | Admitting: Physical Therapy

## 2022-01-13 ENCOUNTER — Encounter: Payer: Self-pay | Admitting: Physical Therapy

## 2022-01-13 DIAGNOSIS — M542 Cervicalgia: Secondary | ICD-10-CM

## 2022-01-13 DIAGNOSIS — R293 Abnormal posture: Secondary | ICD-10-CM

## 2022-01-13 DIAGNOSIS — M25512 Pain in left shoulder: Secondary | ICD-10-CM | POA: Diagnosis not present

## 2022-01-13 DIAGNOSIS — M6281 Muscle weakness (generalized): Secondary | ICD-10-CM
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
## 2022-01-18 ENCOUNTER — Encounter: Payer: Self-pay | Admitting: Physical Therapy

## 2022-01-18 ENCOUNTER — Ambulatory Visit: Payer: Medicare Other | Admitting: Physical Therapy

## 2022-01-18 DIAGNOSIS — M25512 Pain in left shoulder: Secondary | ICD-10-CM | POA: Diagnosis not present

## 2022-01-18 DIAGNOSIS — M6281 Muscle weakness (generalized): Secondary | ICD-10-CM

## 2022-01-18 DIAGNOSIS — R293 Abnormal posture: Secondary | ICD-10-CM

## 2022-01-18 DIAGNOSIS — M542 Cervicalgia: Secondary | ICD-10-CM
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
## 2022-01-20 ENCOUNTER — Encounter: Payer: Self-pay | Admitting: Physical Therapy

## 2022-01-20 ENCOUNTER — Encounter: Payer: Medicare Other | Admitting: Physical Therapy

## 2022-01-20 ENCOUNTER — Ambulatory Visit: Payer: Medicare Other | Attending: Family Medicine | Admitting: Physical Therapy

## 2022-01-20 DIAGNOSIS — M25512 Pain in left shoulder: Secondary | ICD-10-CM | POA: Diagnosis present

## 2022-01-20 DIAGNOSIS — R293 Abnormal posture: Secondary | ICD-10-CM | POA: Insufficient documentation

## 2022-01-20 DIAGNOSIS — M542 Cervicalgia: Secondary | ICD-10-CM | POA: Diagnosis present

## 2022-01-20 DIAGNOSIS — M6281 Muscle weakness (generalized): Secondary | ICD-10-CM | POA: Insufficient documentation
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
## 2022-01-25 NOTE — Progress Notes (Unsigned)
   I, Peterson Lombard, LAT, ATC acting as a scribe for [name], MD.  Nathan Rodriguez is a 76 y.o. male who presents to [place] at [facility] today for f/u chronic L shoulder pain. Pt was last seen by [name] and was advised to cont PT, completing 4 visits, and to return in 6 weeks for a repeat L shoulder steroid injection.  Of note, patient had a right total shoulder replacement on 10/08/2020.  Today, pt reports  Dx imaging: 12/27/21 L shoulder XR  Pertinent review of systems: ***  Relevant historical information: ***   Exam:  There were no vitals taken for this visit. General: Well Developed, well nourished, and in no acute distress.   MSK: ***    Lab and Radiology Results No results found for this or any previous visit (from the past 72 hour(s)). No results found.     Assessment and Plan: 76 y.o. male with ***   PDMP not reviewed this encounter. No orders of the defined types were placed in this encounter.  No orders of the defined types were placed in this encounter.    Discussed warning signs or symptoms. Please see discharge instructions. Patient expresses understanding.   ***

## 2022-01-26 ENCOUNTER — Ambulatory Visit: Payer: Self-pay

## 2022-01-26 ENCOUNTER — Ambulatory Visit: Payer: Medicare Other | Admitting: Family Medicine

## 2022-01-26 VITALS — BP 132/88 | HR 67 | Ht 70.0 in | Wt 168.0 lb

## 2022-01-26 DIAGNOSIS — M25512 Pain in left shoulder: Secondary | ICD-10-CM | POA: Diagnosis not present

## 2022-01-26 DIAGNOSIS — G8929 Other chronic pain: Secondary | ICD-10-CM | POA: Diagnosis not present

## 2022-01-26 NOTE — Patient Instructions (Addendum)
Thank you for coming in today.   You received an injection today. Seek immediate medical attention if the joint becomes red, extremely painful, or is oozing fluid.   Check back as needed 

## 2022-02-01 ENCOUNTER — Ambulatory Visit: Payer: Medicare Other | Admitting: Physical Therapy

## 2022-02-01 ENCOUNTER — Telehealth: Payer: Self-pay | Admitting: Cardiovascular Disease

## 2022-02-01 ENCOUNTER — Encounter: Payer: Self-pay | Admitting: Physical Therapy

## 2022-02-01 DIAGNOSIS — M25512 Pain in left shoulder: Secondary | ICD-10-CM | POA: Diagnosis not present

## 2022-02-01 DIAGNOSIS — M542 Cervicalgia: Secondary | ICD-10-CM

## 2022-02-01 DIAGNOSIS — M6281 Muscle weakness (generalized): Secondary | ICD-10-CM

## 2022-02-01 DIAGNOSIS — R293 Abnormal posture: Secondary | ICD-10-CM
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
## 2022-02-01 NOTE — Telephone Encounter (Signed)
Pt c/o medication issue:  1. Name of Medication:   Evolocumab (REPATHA SURECLICK) 244 MG/ML SOAJ    2. How are you currently taking this medication (dosage and times per day)?   Inject 140 g into skin every 14 days    3. Are you having a reaction (difficulty breathing--STAT)? No  4. What is your medication issue? Pt would like a callback regarding new prescription for next year through [facility]. Please advise

## 2022-02-02 NOTE — Telephone Encounter (Signed)
Called patient and LMOM.  Patients grant is active until 09/22/22.  Card info:  Pharmacy Card CARD NO. [id number]   CARD STATUS Active   BIN [id number]   PCN [id number]   PC GROUP [id number]   HELP DESK [phone]   PROVIDER PDMI   PROCESSOR PDMI

## 2022-02-03 ENCOUNTER — Ambulatory Visit: Payer: Medicare Other | Admitting: Physical Therapy

## 2022-02-03 ENCOUNTER — Encounter: Payer: Self-pay | Admitting: Physical Therapy

## 2022-02-03 DIAGNOSIS — M542 Cervicalgia: Secondary | ICD-10-CM

## 2022-02-03 DIAGNOSIS — M25512 Pain in left shoulder: Secondary | ICD-10-CM | POA: Diagnosis not present

## 2022-02-03 DIAGNOSIS — R293 Abnormal posture: Secondary | ICD-10-CM
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
## 2022-02-23 ENCOUNTER — Telehealth: Payer: Self-pay | Admitting: Cardiovascular Disease

## 2022-02-23 NOTE — Telephone Encounter (Signed)
Patient ask that you give him call back, in regards to the application that he left. Please advise

## 2022-02-24 NOTE — Telephone Encounter (Signed)
Called [facility] and had pharmacy run copay through [employer]. Ran at no charge. Called patient's wife and she is aware.

## 2022-04-11 ENCOUNTER — Other Ambulatory Visit (HOSPITAL_COMMUNITY): Payer: Self-pay

## 2022-04-12 ENCOUNTER — Telehealth: Payer: Self-pay

## 2022-04-12 NOTE — Telephone Encounter (Signed)
Pharmacy Patient Advocate Encounter  Prior Authorization for REPATHA 140 MG/ML INJ has been approved.    Effective dates: 04/11/22 through 04/12/23   Received notification from Kyle that prior authorization for REPATHA 140 MG/ML INJ is needed.    PA submitted on 04/11/22 Key [id number] Status is pending  Karie Soda, [place] Patient Advocate Specialist Direct Number: [phone] Fax: [phone]

## 2022-05-23 ENCOUNTER — Ambulatory Visit (INDEPENDENT_AMBULATORY_CARE_PROVIDER_SITE_OTHER): Payer: PPO

## 2022-05-23 ENCOUNTER — Telehealth: Payer: Self-pay

## 2022-05-23 ENCOUNTER — Ambulatory Visit: Payer: Self-pay

## 2022-05-23 ENCOUNTER — Ambulatory Visit: Payer: PPO | Admitting: Family Medicine

## 2022-05-23 VITALS — BP 128/86 | HR 66 | Ht 70.0 in | Wt 170.0 lb

## 2022-05-23 DIAGNOSIS — G8929 Other chronic pain: Secondary | ICD-10-CM | POA: Diagnosis not present

## 2022-05-23 DIAGNOSIS — M25512 Pain in left shoulder: Secondary | ICD-10-CM

## 2022-05-23 DIAGNOSIS — M5412 Radiculopathy, cervical region: Secondary | ICD-10-CM | POA: Diagnosis not present

## 2022-05-23 NOTE — Progress Notes (Signed)
Nathan Goltz, PhD, LAT, ATC acting as a scribe for [name], MD.  Nathan Rodriguez is a 77 y.o. male who presents to [place] [facility] today for continued left shoulder pain. Pt is a Customer service manager. Patient was last seen by [name] on 01/26/2022 and was given a left GH shoulder steroid injection and was advised to continue HEP.  Today, patient reports minimal relief from prior steroid injection. Pt is wondering what the underlying cause of his pain is. Pt notes he is working out regularly.  He had a trial of physical therapy prior to the glenohumeral injection later in 2023.  He continues home exercise program taught by physical therapy and by myself.  He continues general shoulder soreness.  The pain is located primarily in the lateral upper arm and shoulder but does extend past the elbow occasionally into the left forearm.  Dx imaging: 12/27/21 L shoulder XR   Pertinent review of systems: No fevers or chills.  Relevant historical information: History of pulmonary embolism and DVT.   Exam:  BP 128/86   Pulse 66   Ht '5\' 10"'$  (1.778 m)   Wt 170 lb (77.1 kg)   SpO2 98%   BMI 24.39 kg/m  General: Well Developed, well nourished, and in no acute distress.   MSK: Left shoulder: Normal-appearing Normal motion. Strength reduced abduction and external rotation.  C-spine: Normal appearing Decreased range of motion to left cervical rotation and left lateral flexion. Positive left-sided Spurling's test.    Lab and Radiology Results  X-ray images C-spine obtained today personally and independently interpreted DDD C5-6.  No acute fractures. Await formal radiology review   EXAM: LEFT SHOULDER - 2+ VIEW   COMPARISON:  08/27/2012   FINDINGS: There is no evidence of acute fracture or dislocation. Old displaced fracture of the distal right clavicle again seen.   Mild glenohumeral joint osteoarthritis is seen. High-riding humeral head is seen,  consistent with chronic rotator cuff tear or atrophy.   IMPRESSION: No acute findings.   Old distal right clavicle fracture.   Mild glenohumeral osteoarthritis.   High-riding humeral head, consistent with chronic rotator cuff tear or atrophy.     Electronically Signed   By: Marlaine Hind M.D.   On: 12/27/2021 19:45   I, [name], personally (independently) visualized and performed the interpretation of the images attached in this note.   Assessment and Plan: 77 y.o. male with left shoulder and arm pain.  I think the pain is likely to be multifactorial.  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  He has had a rotator cuff reconstruction or repair in the past over 10 years ago.  He did not get great fantastic benefit from glenohumeral injection and has had a good trial of physical therapy and ongoing home exercise program.  At this point the most likely neck step should be MRI cervical spine to further evaluate source of pain and for future treatment planning.  There may be a cervical radicular component.  His pain is located in his shoulder radiating down his arm which could be consistent with C6 or C7 radiculopathy.    If the MRI of the shoulder ordered above does not show [place] the source of his pain well enough MRI cervical spine could be helpful.   PDMP not reviewed this encounter. Orders Placed This Encounter  Procedures   [place] LIMITED JOINT SPACE  STRUCTURES UP LEFT(NO LINKED CHARGES)    Order Specific Question:   Reason for Exam (SYMPTOM  OR DIAGNOSIS REQUIRED)    Answer:   left shoulder pain    Order Specific Question:   Preferred imaging location?    Answer:   [place]   MR SHOULDER LEFT WO CONTRAST    Standing Status:   Future    Standing Expiration Date:   05/23/2023    Order Specific Question:   What is the patient's sedation requirement?    Answer:   No Sedation     Order Specific Question:   Does the patient have a pacemaker or implanted devices?    Answer:   No    Order Specific Question:   Preferred imaging location?    Answer:   [facility] (table limit-550lbs)   DG Cervical Spine 2 or 3 views    Standing Status:   Future    Number of Occurrences:   1    Standing Expiration Date:   05/23/2023    Order Specific Question:   Reason for Exam (SYMPTOM  OR DIAGNOSIS REQUIRED)    Answer:   eval left cervical rad    Order Specific Question:   Preferred imaging location?    Answer:   [facility]   No orders of the defined types were placed in this encounter.    Discussed warning signs or symptoms. Please see discharge instructions. Patient expresses understanding.   The above documentation has been reviewed and is accurate and complete [name], M.D.  Total encounter time 30 minutes including face-to-face time with the patient and, reviewing past medical record, and charting on the date of service.   Discussion current pain and next steps.

## 2022-05-23 NOTE — Patient Instructions (Addendum)
Thank you for coming in today.   Please get an Xray today before you leave   You should hear from MRI scheduling within 1 week. If you do not hear please let me know.    We will work on the gel shot for the left knee.

## 2022-05-23 NOTE — Telephone Encounter (Signed)
Gregor Hams, MD  Jasper Loser, CMA [place] auth duralane or other gel shot or gel shots left knee

## 2022-05-24 NOTE — Telephone Encounter (Signed)
VOB initiated for Durolane LEFT knee

## 2022-05-24 NOTE — Progress Notes (Signed)
Neck x-ray shows arthritis changes.

## 2022-05-26 NOTE — Telephone Encounter (Signed)
Left message for patient to call back to schedule.  °

## 2022-05-26 NOTE — Telephone Encounter (Addendum)
DUROLANE - left knee  Ready for scheduled on or after 03/21/22  Co-pay: $20 Co-insurance: 20% Deductible: does not apply  Prior Auth: NOT required   Last injections left knee 04/05/21 08/10/21

## 2022-06-05 ENCOUNTER — Other Ambulatory Visit: Payer: Self-pay | Admitting: Student

## 2022-06-06 ENCOUNTER — Other Ambulatory Visit: Payer: Self-pay | Admitting: Cardiovascular Disease

## 2022-06-07 NOTE — Telephone Encounter (Signed)
Prescription refill request for Xarelto received.  Indication: PE Last office visit: 08/06/21  E Monge NP Weight: 74.4kg Age: 77 Scr: 0.92 on 07/14/21 CrCl: 69.64  Based on above findings Xarelto 20mg  daily is the appropriate dose.  Pt is past due for MD appt and lab work.  Message sent to schedulers.  Refill approved x 1.

## 2022-06-16 ENCOUNTER — Ambulatory Visit
Admission: RE | Admit: 2022-06-16 | Discharge: 2022-06-16 | Disposition: A | Discharge status: Home / Self Care | Payer: PPO | Source: Ambulatory Visit | Attending: Family Medicine | Admitting: Family Medicine

## 2022-06-16 DIAGNOSIS — G8929 Other chronic pain: Secondary | ICD-10-CM

## 2022-06-19 ENCOUNTER — Other Ambulatory Visit: Payer: Self-pay | Admitting: Cardiovascular Disease

## 2022-06-19 DIAGNOSIS — R002 Palpitations: Secondary | ICD-10-CM

## 2022-06-20 ENCOUNTER — Ambulatory Visit: Payer: PPO | Admitting: Family Medicine

## 2022-06-20 NOTE — Progress Notes (Signed)
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  You have a little bit of rotator cuff tendinitis as well which is a lesser issue.  Will talk more about this on your return visit tomorrow.

## 2022-06-21 ENCOUNTER — Other Ambulatory Visit: Payer: Self-pay

## 2022-06-21 ENCOUNTER — Ambulatory Visit: Payer: PPO | Admitting: Family Medicine

## 2022-06-21 VITALS — BP 130/88 | HR 87 | Ht 70.0 in | Wt 169.0 lb

## 2022-06-21 DIAGNOSIS — M1712 Unilateral primary osteoarthritis, left knee: Secondary | ICD-10-CM | POA: Diagnosis not present

## 2022-06-21 DIAGNOSIS — M25562 Pain in left knee: Secondary | ICD-10-CM | POA: Diagnosis not present

## 2022-06-21 DIAGNOSIS — M25512 Pain in left shoulder: Secondary | ICD-10-CM

## 2022-06-21 DIAGNOSIS — G8929 Other chronic pain: Secondary | ICD-10-CM

## 2022-06-21 MED ORDER — SODIUM HYALURONATE 60 MG/3ML IX PRSY
60.0000 mg | PREFILLED_SYRINGE | Freq: Once | INTRA_ARTICULAR | Status: AC
  Administered 2022-06-21: 60 mg via INTRA_ARTICULAR

## 2022-06-21 NOTE — Progress Notes (Unsigned)
Nathan Goltz, PhD, LAT, ATC acting as a scribe for Nathan Leader, MD.  Nathan Rodriguez is a 77 y.o. male who presents to [place] at [facility] today for repeat left knee gel shot and to review his L shoulder MRI. Pt is a Customer service manager. Patient was last seen by Dr. Georgina Rodriguez on 05/23/2022 for his left shoulder and cervical radiculopathy and a L shoulder MRI was ordered. Today, pt reports shoulder pain varies and seems to be unrelated to his activity level.   Pt was unsure when his L knee started hurting again, but he is wanting to repeat the gel shot today.  Dx imaging: 06/16/2022 L shoulder MRI 05/23/2022 C-spine x-ray 12/27/21 L shoulder XR   Pertinent review of systems: No fevers or chills  Relevant historical information: History of right shoulder and right knee replacement.  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   Exam:  BP 130/88   Pulse 87   Ht 5\' 10"  (1.778 m)   Wt 169 lb (76.7 kg)   SpO2 97%   BMI 24.25 kg/m  General: Well Developed, well nourished, and in no acute distress.   MSK: Left shoulder: Mature scar superior shoulder.  Decreased range of motion.  Intact abduction.  Strength intact.  Left knee mild effusion normal motion with crepitation.    Lab and Radiology Results  Procedure: Real-time Ultrasound Guided Injection of left knee joint superior lateral patellar space Device: Philips Affiniti 50G Images permanently stored and available for review in PACS Verbal informed consent obtained.  Discussed risks and benefits of procedure. Warned about infection, bleeding, damage to structures among others. Patient expresses understanding and agreement Time-out conducted.   Noted no overlying erythema, induration, or other signs of local infection.   Skin prepped in a sterile fashion.   Local anesthesia: Topical Ethyl chloride.   With sterile technique and under real time ultrasound guidance: Durolane 60 mg injected into knee joint. Fluid  seen entering the joint capsule.   Completed without difficulty   Advised to call if fevers/chills, erythema, induration, drainage, or persistent bleeding.   Images permanently stored and available for review in the ultrasound unit.  Impression: Technically successful ultrasound guided injection. Lot number: [id number]   Procedure: Real-time Ultrasound Guided Injection of left shoulder joint glenohumeral joint posterior. Device: Philips Affiniti 50G Images permanently stored and available for review in PACS Verbal informed consent obtained.  Discussed risks and benefits of procedure. Warned about infection, bleeding, hyperglycemia damage to structures among others. Patient expresses understanding and agreement Time-out conducted.   Noted no overlying erythema, induration, or other signs of local infection.   Skin prepped in a sterile fashion.   Local anesthesia: Topical Ethyl chloride.   With sterile technique and under real time ultrasound guidance: 40 mg of Kenalog and 2 m Marcaine injected into joint capsule. Fluid seen entering the shoulder joint.   Completed without difficulty   Pain immediately resolved suggesting accurate placement of the medication.   Advised to call if fevers/chills, erythema, induration, drainage, or persistent bleeding.   Images permanently stored and available for review in the ultrasound unit.  Impression: Technically successful ultrasound guided injection.    EXAM: MRI OF THE LEFT SHOULDER WITHOUT CONTRAST   TECHNIQUE: Multiplanar, multisequence MR imaging of the shoulder was performed. No intravenous contrast was administered.   COMPARISON:  None Available.   FINDINGS: Rotator cuff: Prior rotator cuff repair. Mild tendinosis of the supraspinatus tendon. Moderate tendinosis of the infraspinatus tendon. Teres  minor tendon is intact. Subscapularis tendon is intact.   Muscles: No muscle atrophy or edema. No intramuscular fluid collection or hematoma.    Biceps Long Head: Intraarticular and extraarticular portions of the biceps tendon are intact.   Acromioclavicular Joint: No significant arthropathy of the acromioclavicular joint. Small amount of subacromial/subdeltoid bursal fluid.   Glenohumeral Joint: Small joint effusion. Full-thickness cartilage loss of the glenohumeral joint.   Labrum: Diffuse labral degeneration.   Bones: No fracture or dislocation. No aggressive osseous lesion.   Other: No fluid collection or hematoma.   IMPRESSION: 1. Prior rotator cuff repair. Mild tendinosis of the supraspinatus tendon. Moderate tendinosis of the infraspinatus tendon. 2. Severe osteoarthritis of the left glenohumeral joint.     Electronically Signed   By: Kathreen Devoid M.D.   On: 06/18/2022 05:58   I, Nathan Rodriguez, personally (independently) visualized and performed the interpretation of the images attached in this note.     Assessment and Plan: 77 y.o. male with left knee pain thought to be due to exacerbation of DJD.  Plan for Durling injection today.  We can repeat this shot every 6 months.  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  This was seen recently on MRI.  Plan for glenohumeral injection.  We can repeat this every 3 months.  He is not willing to consider another shoulder replacement.  He had such a rough time with his right shoulder.   PDMP not reviewed this encounter. Orders Placed This Encounter  Procedures   [place] LIMITED JOINT SPACE STRUCTURES LOW LEFT(NO LINKED CHARGES)    Order Specific Question:   Reason for Exam (SYMPTOM  OR DIAGNOSIS REQUIRED)    Answer:   left knee pain    Order Specific Question:   Preferred imaging location?    Answer:   [place]   Meds ordered this encounter  Medications   Sodium Hyaluronate (DUROLANE) intra-articular injection 60 mg     Discussed warning signs or symptoms. Please see discharge instructions. Patient expresses understanding.   The above  documentation has been reviewed and is accurate and complete Nathan Rodriguez, M.D.

## 2022-06-21 NOTE — Patient Instructions (Addendum)
Thank you for coming in today.   Call or go to the ER if you develop a large red swollen joint with extreme pain or oozing puss.    Shoulder injection can be 3 months  Knee injection can be repeated in 6 months.

## 2022-06-26 ENCOUNTER — Other Ambulatory Visit: Payer: Self-pay | Admitting: Cardiovascular Disease

## 2022-06-27 NOTE — Telephone Encounter (Signed)
Durolane LEFT knee OA   Last inj 06/21/22 Can consider repeat 12/22/22

## 2022-07-27 ENCOUNTER — Other Ambulatory Visit: Payer: Self-pay | Admitting: Cardiovascular Disease

## 2022-07-27 DIAGNOSIS — I350 Nonrheumatic aortic (valve) stenosis: Secondary | ICD-10-CM

## 2022-07-27 DIAGNOSIS — E785 Hyperlipidemia, unspecified: Secondary | ICD-10-CM

## 2022-09-05 ENCOUNTER — Other Ambulatory Visit: Payer: Self-pay | Admitting: Cardiovascular Disease

## 2022-09-06 NOTE — Telephone Encounter (Signed)
Prescription refill request for Xarelto received.  Indication:dvt Last office visit:upcoming Weight:76.7  kg Age:77 ZOX:WRUEA labs CrCl:needs labs  Prescription refilled

## 2022-09-21 NOTE — Progress Notes (Unsigned)
[name], PhD, LAT, ATC acting as a scribe for [name], MD.  Nathan Rodriguez is a 77 y.o. male who presents to [facility] at [facility] today for cont'd L shoulder pain. Pt was last seen by Dr. Denyse Amass on 06/21/22 and was given a L GH steroid injection and L knee Durolane injection.   Today, pt reports prior GH steroid injection was beneficial. Pain varies day to day.   Dx imaging: 06/16/2022 L shoulder MRI 05/23/2022 C-spine x-ray 12/27/21 L shoulder XR   Pertinent review of systems: No fevers or chills  Relevant historical information: History old displaced clavicle fracture left side.   Exam:  BP 128/84   Pulse 68   Ht 5\' 10"  (1.778 m)   Wt 167 lb (75.8 kg)   SpO2 97%   BMI 23.96 kg/m  General: Well Developed, well nourished, and in no acute distress.   MSK: Left shoulder: Mature scar anterior shoulder.  With shoulder motion a prominent bone is visible in the superior portion of the shoulder.  No skin tenting but there is visible change in the appearance of his clavicle region with shoulder motion. Shoulder motion is intact Strength is intact.    Lab and Radiology Results  Procedure: Real-time Ultrasound Guided Injection of left shoulder glenohumeral joint posterior approach Device: Philips Affiniti 50G/GE Logiq Images permanently stored and available for review in PACS Verbal informed consent obtained.  Discussed risks and benefits of procedure. Warned about infection, bleeding, hyperglycemia damage to structures among others. Patient expresses understanding and agreement Time-out conducted.   Noted no overlying erythema, induration, or other signs of local infection.   Skin prepped in a sterile fashion.   Local anesthesia: Topical Ethyl chloride.   With sterile technique and under real time ultrasound guidance: 40 mg of Kenalog and 2 mL Marcaine injected into glenohumeral joint. Fluid seen entering the joint capsule.   Completed without  difficulty   Pain immediately resolved suggesting accurate placement of the medication.   Advised to call if fevers/chills, erythema, induration, drainage, or persistent bleeding.   Images permanently stored and available for review in the ultrasound unit.  Impression: Technically successful ultrasound guided injection.   EXAM: LEFT SHOULDER - 2+ VIEW   COMPARISON:  08/27/2012   FINDINGS: There is no evidence of acute fracture or dislocation. Old displaced fracture of the distal right clavicle again seen.   Mild glenohumeral joint osteoarthritis is seen. High-riding humeral head is seen, consistent with chronic rotator cuff tear or atrophy.   IMPRESSION: No acute findings.   Old distal right clavicle fracture.   Mild glenohumeral osteoarthritis.   High-riding humeral head, consistent with chronic rotator cuff tear or atrophy.     Electronically Signed   By: Danae Orleans M.D.   On: 12/27/2021 19:45 I, [name], personally (independently) visualized and performed the interpretation of the images attached in this note.      Assessment and Plan: 77 y.o. male with left shoulder pain due to exacerbation of DJD.  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  The fracture is very visible with shoulder motion.  His symptoms are probably not bad enough to proceed to surgery at this time.  Plan for steroid injection glenohumeral joint and check back as needed.  If needed surgical consultation he has an established relationship with Dr. Ave Filter.   PDMP not reviewed this encounter. Orders Placed This Encounter  Procedures   [place] LIMITED JOINT SPACE STRUCTURES UP LEFT(NO LINKED CHARGES)  Order Specific Question:   Reason for Exam (SYMPTOM  OR DIAGNOSIS REQUIRED)    Answer:   left shoulder pain    Order Specific Question:   Preferred imaging location?    Answer:   [facility]   No orders of the defined types were placed in this  encounter.    Discussed warning signs or symptoms. Please see discharge instructions. Patient expresses understanding.   The above documentation has been reviewed and is accurate and complete [name], M.D.

## 2022-09-26 ENCOUNTER — Ambulatory Visit: Payer: PPO | Admitting: Family Medicine

## 2022-09-26 ENCOUNTER — Other Ambulatory Visit: Payer: Self-pay

## 2022-09-26 VITALS — BP 128/84 | HR 68 | Ht 70.0 in | Wt 167.0 lb

## 2022-09-26 DIAGNOSIS — G8929 Other chronic pain: Secondary | ICD-10-CM

## 2022-09-26 DIAGNOSIS — M25512 Pain in left shoulder: Secondary | ICD-10-CM

## 2022-09-26 NOTE — Patient Instructions (Signed)
Thank you for coming in today.   You received an injection today. Seek immediate medical attention if the joint becomes red, extremely painful, or is oozing fluid.   Check back as needed 

## 2022-10-18 ENCOUNTER — Telehealth: Payer: Self-pay | Admitting: Cardiovascular Disease

## 2022-10-18 NOTE — Telephone Encounter (Signed)
Unfortunately the [employer] hypercholesterolemia grant is closed at the moment. We do not know when it will re-open. Called and LVM for pt to call back.

## 2022-10-18 NOTE — Telephone Encounter (Signed)
Pt c/o medication issue:  1. Name of Medication: Evolocumab (REPATHA SURECLICK) 140 MG/ML SOAJ   2. How are you currently taking this medication (dosage and times per day)? Inject every 14 days  3. Are you having a reaction (difficulty breathing--STAT)? no  4. What is your medication issue? Patient states he was told by his pharmacy that the [employer] grant needs to be renewed for this medication.

## 2022-10-18 NOTE — Telephone Encounter (Signed)
Call to patient .  LM that would inquire with pharmacy. Please advise

## 2022-10-19 NOTE — Telephone Encounter (Signed)
Pt left message returning call. I called pt back, no answer, left detailed voicemail. I have added him to our list of patients to call when [facility] grant reopens.

## 2022-10-21 ENCOUNTER — Encounter: Payer: Self-pay | Admitting: Cardiovascular Disease

## 2022-10-21 ENCOUNTER — Ambulatory Visit: Payer: PPO | Admitting: Cardiovascular Disease

## 2022-10-21 VITALS — BP 116/78 | HR 61 | Ht 70.0 in | Wt 163.6 lb

## 2022-10-21 DIAGNOSIS — I82599 Chronic embolism and thrombosis of other specified deep vein of unspecified lower extremity: Secondary | ICD-10-CM

## 2022-10-21 DIAGNOSIS — R002 Palpitations: Secondary | ICD-10-CM

## 2022-10-21 DIAGNOSIS — I2510 Atherosclerotic heart disease of native coronary artery without angina pectoris: Secondary | ICD-10-CM | POA: Diagnosis not present

## 2022-10-21 DIAGNOSIS — E782 Mixed hyperlipidemia: Secondary | ICD-10-CM | POA: Diagnosis not present

## 2022-10-21 DIAGNOSIS — I119 Hypertensive heart disease without heart failure: Secondary | ICD-10-CM | POA: Diagnosis not present

## 2022-10-21 DIAGNOSIS — R000 Tachycardia, unspecified: Secondary | ICD-10-CM

## 2022-10-21 LAB — BASIC METABOLIC PANEL

## 2022-10-21 LAB — CBC
Hematocrit: 48.9 % (ref 37.5–51.0)
Hemoglobin: 16.2 g/dL (ref 13.0–17.7)
MCH: 32.4 pg (ref 26.6–33.0)
MCHC: 33.1 g/dL (ref 31.5–35.7)
MCV: 98 fL — ABNORMAL HIGH (ref 79–97)
Platelets: 261 10*3/uL (ref 150–450)
RBC: 5 x10E6/uL (ref 4.14–5.80)
RDW: 12.8 % (ref 11.6–15.4)
WBC: 5.2 10*3/uL (ref 3.4–10.8)

## 2022-10-21 MED ORDER — REPATHA SURECLICK 140 MG/ML ~~LOC~~ SOAJ
140.0000 mg | SUBCUTANEOUS | Status: DC
Start: 2022-10-21 — End: 2022-11-16

## 2022-10-21 NOTE — Progress Notes (Signed)
10/21/2022 Nathan Rodriguez   1945/04/19  [id number]  Primary Physician Caffie Damme, MD Primary Cardiologist: Runell Gess MD Nicholes Calamity, [place]  HPI:  Nathan Rodriguez is a 77 y.o.   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 He was referred by [facility] for cardiovascular evaluation because of progressive dyspnea .  I last saw him in the office 03/24/2021.Marland Kitchen  He has a history of CAD status post rotational atherectomy, PCI and drug eluting stenting 2 by Dr. Daphene Jaeger 11/03/2010 of his LAD. 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 This resected include hyperlipidemia not on statin therapy, history of tobacco abuse smoking 5-6 cigarettes a day off and on for the last 40 years.There is a family history of a father who had bypass surgery at age 74. I performed left heart cath on him 08/31/2015 revealing a widely patent stent, no significant CAD with normal LV function.  Right after that he was diagnosed with a DVT and PE was placed on Xarelto.  His symptoms of dyspnea resolved.  He is being sent back now by Arnette Felts PA-C because of elevated heart rate with exercise and some dyspnea.   I obtained a 2D echocardiogram on him 03/23/2018 which was essentially normal and a Myoview stress test that showed diaphragmatic attenuation.  He did see Dr. Elberta Fortis in the office for tachycardia and an event monitor showed short runs of nonsustained ventricular tachycardia and atrial tachycardia.  He was prescribed metoprolol twice daily.  His tachycardic episodes have markedly improved.  He is no longer short of breath and is able to exercise without limitation.  He denies chest pain.  Our pharmacist Raquel is also working with him to initiate Repatha therapy for his hyperlipidemia and statin intolerance.   Since I saw him in the office a year and  a half ago he continues to do well.  He is very active and rides his exercise bike indoors.  He denies chest pain or shortness of breath.  He has had a right total knee replacement and shoulder replacement since I last saw him.   Current Meds  Medication Sig   acetaminophen (TYLENOL) 500 MG tablet Take 500 mg by mouth every 6 (six) hours as needed.   aluminum hydroxide-magnesium carbonate (GAVISCON) 95-358 MG/15ML SUSP Take 15 mLs by mouth as needed for indigestion or heartburn.   ANUCORT-HC 25 MG suppository Place 25 mg rectally at bedtime as needed for itching.   docusate sodium (COLACE) 100 MG capsule Take 1 capsule (100 mg total) by mouth 2 (two) times daily.   Evolocumab (REPATHA SURECLICK) 140 MG/ML SOAJ INJECT 1 PEN UNDER THE SKIN EVERY 14 DAYS   hydrocortisone valerate cream (WESTCORT) 0.2 % Apply 1 application topically 2 (two) times daily as needed (irritation).   metoprolol tartrate (LOPRESSOR) 25 MG tablet Take 1 tablet by mouth once daily   Multiple Vitamin (MULTIVITAMIN WITH MINERALS) TABS tablet Take 1 tablet by mouth daily.   Omega-3 1000 MG CAPS Take 1,000 mg by mouth daily.   omeprazole (PRILOSEC) 20 MG capsule Take 20 mg by mouth daily.   XARELTO 20 MG TABS tablet TAKE 1 TABLET BY MOUTH ONCE DAILY WITH SUPPER     No Known Allergies  Social History   Socioeconomic History   Marital status: Married    Spouse name: Not  on file   Number of children: Not on file   Years of education: Not on file   Highest education level: Not on file  Occupational History   Not on file  Tobacco Use   Smoking status: Former    Current packs/day: 0.00    Average packs/day: 0.3 packs/day for 43.0 years (10.8 ttl pk-yrs)    Types: Cigarettes    Start date: 03/22/1959    Quit date: 03/21/2002    Years since quitting: 20.6   Smokeless tobacco: Never  Vaping Use   Vaping status: Never Used  Substance and Sexual Activity   Alcohol use: Yes    Comment: rare   Drug use: No   Sexual  activity: Not Currently  Other Topics Concern   Not on file  Social History Narrative   Not on file   Social Determinants of Health   Financial Resource Strain: Not on file  Food Insecurity: Not on file  Transportation Needs: Not on file  Physical Activity: Not on file  Stress: Not on file  Social Connections: Unknown (08/01/2021)   Received from [facility], [facility]   Social Network    Social Network: Not on file  Intimate Partner Violence: Unknown (06/23/2021)   Received from [facility], [facility]   HITS    Physically Hurt: Not on file    Insult or Talk Down To: Not on file    Threaten Physical Harm: Not on file    Scream or Curse: Not on file     Review of Systems: General: negative for chills, fever, night sweats or weight changes.  Cardiovascular: negative for chest pain, dyspnea on exertion, edema, orthopnea, palpitations, paroxysmal nocturnal dyspnea or shortness of breath Dermatological: negative for rash Respiratory: negative for cough or wheezing Urologic: negative for hematuria Abdominal: negative for nausea, vomiting, diarrhea, bright red blood per rectum, melena, or hematemesis Neurologic: negative for visual changes, syncope, or dizziness All other systems reviewed and are otherwise negative except as noted above.    Blood pressure 116/78, pulse 61, height 5\' 10"  (1.778 m), weight 163 lb 9.6 oz (74.2 kg), SpO2 98%.  General appearance: alert and no distress Neck: no adenopathy, no carotid bruit, no JVD, supple, symmetrical, trachea midline, and thyroid not enlarged, symmetric, no tenderness/mass/nodules Lungs: clear to auscultation bilaterally Heart: Regular rate and rhythm without murmurs gallops rubs or clicks Extremities: extremities normal, atraumatic, no cyanosis or edema Pulses: 2+ and symmetric Skin: Skin color, texture, turgor normal. No rashes or lesions Neurologic: Grossly normal  EKG EKG Interpretation Date/Time:  Friday October 21 2022 11:15:38 EDT Ventricular Rate:  61 PR Interval:  192 QRS Duration:  144 QT Interval:  416 QTC Calculation: 418 R Axis:   45  Text Interpretation: Normal sinus rhythm Right bundle branch block When compared with ECG of 27-May-2021 10:20, PREVIOUS ECG IS PRESENT Confirmed by Nanetta Batty [phone]) on 10/21/2022 11:45:52 AM    ASSESSMENT AND PLAN:   Coronary artery disease involving native coronary artery of native heart without angina pectoris History of CAD status post rotational atherectomy, PCI and stenting x 2 Dr. Daphene Jaeger of his LAD 11/03/2010.  I performed heart catheterization on him 08/31/2015 revealed widely patent stent with no other significant CAD.  He is very active and is asymptomatic.  Hyperlipidemia History of hyperlipidemia intolerant to statin drugs on Repatha lipid profile recently performed by his PCP 08/08/2022 revealing a total cholesterol 147, LDL 79 and HDL of 58.  Hypertensive heart disease History  of essential tremor today at 102/64.  He is on metoprolol.  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  Tachycardia History of tachycardia improved on beta-blockade.     Runell Gess MD FACP,FACC,FAHA, [facility] 10/21/2022 11:58 AM

## 2022-10-21 NOTE — Addendum Note (Signed)
Addended by: Aijalon Kirtz,  C on: 10/21/2022 04:56 PM   Modules accepted: Orders

## 2022-10-21 NOTE — Assessment & Plan Note (Signed)
History of CAD status post rotational atherectomy, PCI and stenting x 2 Dr. Daphene Jaeger of his LAD 11/03/2010.  I performed heart catheterization on him 08/31/2015 revealed widely patent stent with no other significant CAD.  He is very active and is asymptomatic.

## 2022-10-21 NOTE — Assessment & Plan Note (Signed)
History of essential tremor today at 102/64.  He is on metoprolol.

## 2022-10-21 NOTE — Assessment & Plan Note (Signed)
History of tachycardia improved on beta-blockade.

## 2022-10-21 NOTE — Assessment & Plan Note (Signed)
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

## 2022-10-21 NOTE — Assessment & Plan Note (Signed)
History of hyperlipidemia intolerant to statin drugs on Repatha lipid profile recently performed by his PCP 08/08/2022 revealing a total cholesterol 147, LDL 79 and HDL of 58.

## 2022-10-21 NOTE — Patient Instructions (Signed)
Medication Instructions:  - No changes  *If you need a refill on your cardiac medications before your next appointment, please call your pharmacy*   Lab Work: - BMET and CBC today  If you have labs (blood work) drawn today and your tests are completely normal, you will receive your results only by: MyChart Message (if you have MyChart) OR A paper copy in the mail If you have any lab test that is abnormal or we need to change your treatment, we will call you to review the results.   Testing/Procedures: - None ordered   Follow-Up: At [facility], you and your health needs are our priority.  As part of our continuing mission to provide you with exceptional heart care, we have created designated Provider Care Teams.  These Care Teams include your primary Cardiologist (physician) and Advanced Practice Providers (APPs -  Physician Assistants and Nurse Practitioners) who all work together to provide you with the care you need, when you need it.  We recommend signing up for the patient portal called "MyChart".  Sign up information is provided on this After Visit Summary.  MyChart is used to connect with patients for Virtual Visits (Telemedicine).  Patients are able to view lab/test results, encounter notes, upcoming appointments, etc.  Non-urgent messages can be sent to your provider as well.   To learn more about what you can do with MyChart, go to [email or web address].    Your next appointment:   1 year(s)  Provider:   Nanetta Batty, MD

## 2022-10-24 ENCOUNTER — Encounter: Payer: Self-pay | Admitting: Pharmacist

## 2022-10-24 NOTE — Telephone Encounter (Signed)
Error

## 2022-10-25 ENCOUNTER — Telehealth: Payer: Self-pay | Admitting: Cardiovascular Disease

## 2022-10-25 NOTE — Telephone Encounter (Signed)
Pt has new insurance and needs a new PA sent.

## 2022-10-25 NOTE — Telephone Encounter (Signed)
Pt c/o medication issue:  1. Name of Medication:   Evolocumab (REPATHA SURECLICK) 140 MG/ML SOAJ    2. How are you currently taking this medication (dosage and times per day)?   3. Are you having a reaction (difficulty breathing--STAT)?   4. What is your medication issue? [employer] is calling in regards to this patient's insurance. She states that patient seems to have changed insurance and would need a new PA sent in.

## 2022-10-26 NOTE — Telephone Encounter (Signed)
Patient is following up. He states he spoke with [employer] and they received the application but page 5 with the provider's information is completely blank.  Patient is also requesting updates on status of PA for insurance.

## 2022-10-27 ENCOUNTER — Other Ambulatory Visit (HOSPITAL_COMMUNITY): Payer: Self-pay

## 2022-10-27 NOTE — Telephone Encounter (Signed)
Patient is returning a call. °

## 2022-10-27 NOTE — Telephone Encounter (Signed)
Left voicemail to return call to office.

## 2022-11-01 ENCOUNTER — Telehealth: Payer: Self-pay | Admitting: Cardiovascular Disease

## 2022-11-01 NOTE — Telephone Encounter (Signed)
Patient states he received  a call from [employer] and they stated page five of the application was blank that needed to be filled out by Dr. Allyson Sabal.   Printed out page five for provider to sign

## 2022-11-01 NOTE — Telephone Encounter (Signed)
  Pt c/o medication issue:  1. Name of Medication: Evolocumab (REPATHA SURECLICK) 140 MG/ML SOAJ   2. How are you currently taking this medication (dosage and times per day)? As directed  3. Are you having a reaction (difficulty breathing--STAT)?  no  4. What is your medication issue?    Patient states that he was told that page 5 of his prior Berkley Harvey was not completed by Dr Allyson Sabal. He would like to speak with somone in regards to this issue. Please call

## 2022-11-02 NOTE — Telephone Encounter (Signed)
This pt is calling back. I informed Dr. Allyson Sabal was not in office last week and not in this week. I informed he will back next Tuesday and I would send his nurse a message just as a reminder since he stated he was pressed for time to get this application in.

## 2022-11-03 NOTE — Telephone Encounter (Signed)
Patient aware page 5 was reprinted and given to Nurse Tonye Pearson

## 2022-11-04 NOTE — Telephone Encounter (Signed)
Spoke with pt regarding page 5 of [employer] pt assistance application. Made pt aware that page 5 was faxed to [employer] on Tuesday, 8/13. Pt states that he spoke with someone at [employer] yesterday and they now just need proof of PA for repatha sent to them. PA in media of pt's chart. Will print and fax over to [employer]. Pt verbalizes understanding.

## 2022-11-04 NOTE — Telephone Encounter (Signed)
PA from [employer] printed and faxed to [employer]. Confirmation of successful fax received.

## 2022-11-16 ENCOUNTER — Telehealth: Payer: Self-pay | Admitting: Pharmacist

## 2022-11-16 DIAGNOSIS — I350 Nonrheumatic aortic (valve) stenosis: Secondary | ICD-10-CM

## 2022-11-16 DIAGNOSIS — E785 Hyperlipidemia, unspecified: Secondary | ICD-10-CM

## 2022-11-16 MED ORDER — REPATHA SURECLICK 140 MG/ML ~~LOC~~ SOAJ
SUBCUTANEOUS | 3 refills | Status: DC
Start: 2022-11-16 — End: 2023-11-01

## 2022-11-16 NOTE — Telephone Encounter (Signed)
Called pt to advise him that [employer] has reopened and I have been able to re-enroll him. Updated rx sent in with new grant info. Pt states he has trouble accessing his MyChart, he is aware that [facility] should send him updated grant info in the mail too. He also states that he was recently approved for pt assistance from [employer] and got 3 month rx of Repatha. Advised him that is fine, that funding will end at the end of the year and [facility] will continue working.  Grant info: CARD NO. [id number]   CARD STATUS Active   BIN [id number]   PCN [id number]   PC GROUP [id number]

## 2022-11-17 NOTE — Telephone Encounter (Signed)
Fax received from [employer] that pt is enrolled throught 03/21/23. He will need to re-apply at the beginning of the year.

## 2022-11-22 NOTE — Telephone Encounter (Signed)
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

## 2022-11-23 NOTE — Telephone Encounter (Signed)
DUROLANE for LEFT knee OA  OK to scheduled on or after 12/22/22  Primary Insurance: [employer] Co-pay: n/a Co-insurance: 20% Deductible: does not apply Prior Auth: NOT required   Knee Injection History 12/24/20 - Kenalog BILAT 01/27/21 - Durolane BILAT 07/26/21 - Kenalog LEFT 08/10/21 - Durolane LEFT 06/21/22 - Durolane LEFT

## 2022-11-23 NOTE — Telephone Encounter (Signed)
 NO Prior Auth required

## 2022-11-23 NOTE — Telephone Encounter (Signed)
Holding until needed by patient.

## 2022-11-24 ENCOUNTER — Other Ambulatory Visit: Payer: Self-pay

## 2022-11-24 ENCOUNTER — Encounter: Payer: Self-pay | Admitting: Family Medicine

## 2022-11-24 ENCOUNTER — Ambulatory Visit: Payer: PPO | Admitting: Family Medicine

## 2022-11-24 VITALS — BP 122/84 | HR 58 | Ht 70.0 in | Wt 165.0 lb

## 2022-11-24 DIAGNOSIS — M25561 Pain in right knee: Secondary | ICD-10-CM | POA: Diagnosis not present

## 2022-11-24 DIAGNOSIS — G8929 Other chronic pain: Secondary | ICD-10-CM | POA: Diagnosis not present

## 2022-11-24 NOTE — Patient Instructions (Signed)
Thank you for coming in today.   Please use Voltaren gel (Generic Diclofenac Gel) up to 4x daily for pain as needed.  This is available over-the-counter as both the name brand Voltaren gel and the generic diclofenac gel.   I recommend you obtained a compression sleeve to help with your joint problems. There are many options on the market however I recommend obtaining a full knee Body Helix compression sleeve.  You can find information (including how to appropriate measure yourself for sizing) can be found at www.[email or web address].ch.  Many of these products are health savings account (HSA) eligible.   You can use the compression sleeve at any time throughout the day but is most important to use while being active as well as for 2 hours post-activity.   It is appropriate to ice following activity with the compression sleeve in place.    Work on eccentric exercises.  Go from knee straight to bent slowly with resistance typically on the knee extension machine.  If not improving we could do shockwave machine.

## 2022-11-24 NOTE — Progress Notes (Signed)
   I, Stevenson Clinch, CMA acting as a scribe for [name], MD.  Nathan Rodriguez is a 77 y.o. male who presents to [facility] at [facility] today for exacerbation of his bilat knee pain. Pt had a R TKR on 05/21/21 by Dr. Luiz Blare and pt suffered PE complications post-op. Pt was last seen for his knees by Dr. Denyse Amass on 08/10/21 and was given a Durolane injection in his L knee. Last L knee steroid injection was in May 2023.  Today, pt reports worsening right knee pain. Hx of knee replacement. Sx progressively worsening over the past 3-4 days. Sx worse with climbing, sit-to-stand. Locates pain proximal to patella.   Dx imaging: 07/26/21 L knee XR 05/27/21 R knee XR             09/14/17 L knee XR  Pertinent review of systems: No fevers or chills  Relevant historical information: History of right total knee replacement   Exam:  BP 122/84   Pulse (!) 58   Ht 5\' 10"  (1.778 m)   Wt 165 lb (74.8 kg)   SpO2 100%   BMI 23.68 kg/m  General: Well Developed, well nourished, and in no acute distress.   MSK: Right knee: Mature scar anterior knee with minimal effusion. Tender palpation at superior patella near the quadriceps tendon insertion. Normal knee motion. Intact strength some pain resisted knee extension.    Lab and Radiology Results  Diagnostic Limited MSK Ultrasound of: Right knee Quadriceps tendon is intact.  Some's calcification present at the distal quadriceps tendon near the insertion. Overlying the patella at the superior patella pole he does have a little bit of increased thickness of the soft tissue that could represent early or developing prepatellar bursitis. Impression: Distal quadriceps tendinitis or prepatellar bursitis.     Assessment and Plan: 77 y.o. male with right knee pain after increasing activity level thought to be quadriceps tendinitis or prepatellar bursitis.  Plan for Voltaren gel compression and home exercise program.  If not improved consider  shockwave treatment.   PDMP not reviewed this encounter. Orders Placed This Encounter  Procedures   [place] LIMITED JOINT SPACE STRUCTURES LOW BILAT(NO LINKED CHARGES)    Order Specific Question:   Reason for Exam (SYMPTOM  OR DIAGNOSIS REQUIRED)    Answer:   bilat knee pain    Order Specific Question:   Preferred imaging location?    Answer:   [facility]   No orders of the defined types were placed in this encounter.    Discussed warning signs or symptoms. Please see discharge instructions. Patient expresses understanding.   The above documentation has been reviewed and is accurate and complete [name], M.D.

## 2022-11-24 NOTE — Telephone Encounter (Addendum)
Benefits undisclosed

## 2022-11-24 NOTE — Telephone Encounter (Signed)
Rodolph Bong, MD  Dierdre Searles, CMA He will be due for a repeat Durlane injection left knee Oct 2nd. Please auth when able.

## 2022-11-28 NOTE — Telephone Encounter (Signed)
DUROLANE for LEFT knee OA  OK to scheduled on or after 12/22/22  Primary Insurance: [employer] Co-pay: n/a Co-insurance: 20% Deductible: does not apply Prior Auth: NOT required   Knee Injection History 12/24/20 - Kenalog BILAT 01/27/21 - Durolane BILAT 07/26/21 - Kenalog LEFT 08/10/21 - Durolane LEFT 06/21/22 - Durolane LEFT

## 2022-11-29 ENCOUNTER — Ambulatory Visit (INDEPENDENT_AMBULATORY_CARE_PROVIDER_SITE_OTHER): Payer: Self-pay | Admitting: Family Medicine

## 2022-11-29 ENCOUNTER — Other Ambulatory Visit: Payer: Self-pay | Admitting: Cardiovascular Disease

## 2022-11-29 DIAGNOSIS — M76899 Other specified enthesopathies of unspecified lower limb, excluding foot: Secondary | ICD-10-CM

## 2022-11-29 NOTE — Progress Notes (Signed)
   [facility] [address] [place] [address] Phone: [phone]   Extracorporeal Shockwave Therapy Note    Patient is being treated today with ECSWT. Informed consent was obtained and patient tolerated procedure well.   Therapy performed by [name]  Condition treated: Distal quadriceps tendinitis bilaterally right worse than left Treatment preset used: Tendinitis Energy used: 100 mJ Frequency used: 10 Hz right increasing to 13 Hz left Number of pulses: 4000 total.  2000 each side Head Size: Medium Treatment #1 of #4  Electronically signed by:  [facility] 12:33 PM 11/29/22

## 2022-11-29 NOTE — Telephone Encounter (Signed)
Prescription refill request for Xarelto received.  Indication:dvt Last office visit:8/24 Weight:74.8  kg Age:77 Scr:0.93  8/24 CrCl:68.14  ml/min  Prescription refilled

## 2022-11-29 NOTE — Telephone Encounter (Signed)
Holding until needed.

## 2022-12-06 ENCOUNTER — Other Ambulatory Visit: Payer: Self-pay | Admitting: Cardiovascular Disease

## 2022-12-06 DIAGNOSIS — R002 Palpitations: Secondary | ICD-10-CM

## 2022-12-07 ENCOUNTER — Ambulatory Visit: Payer: Self-pay | Admitting: Family Medicine

## 2022-12-07 DIAGNOSIS — M76899 Other specified enthesopathies of unspecified lower limb, excluding foot: Secondary | ICD-10-CM

## 2022-12-07 NOTE — Progress Notes (Signed)
   [facility] [address] [place] [address] Phone: [phone]   Extracorporeal Shockwave Therapy Note    Patient is being treated today with ECSWT. Informed consent was obtained and patient tolerated procedure well.   Therapy performed by [name]  Condition treated: Bilateral quadriceps tendinitis Treatment preset used: Tendinitis Energy used: 120 mJ Frequency used: 13 Hz Number of pulses: 4000 total.  2000 each side Head Size: Medium Treatment #2 of #4  Electronically signed by:  [facility] 12:26 PM 12/07/22

## 2022-12-14 ENCOUNTER — Ambulatory Visit (INDEPENDENT_AMBULATORY_CARE_PROVIDER_SITE_OTHER): Payer: Self-pay | Admitting: Family Medicine

## 2022-12-14 DIAGNOSIS — M76899 Other specified enthesopathies of unspecified lower limb, excluding foot: Secondary | ICD-10-CM

## 2022-12-14 NOTE — Progress Notes (Signed)
   [facility] [address] [place] [address] Phone: [phone]   Extracorporeal Shockwave Therapy Note    Patient is being treated today with ECSWT. Informed consent was obtained and patient tolerated procedure well.   Therapy performed by [name]  Condition treated: Bilateral quadriceps tendinitis Treatment preset used: Tendinitis Energy used: 120 mJ Frequency used: 14 Hz Number of pulses: 4000 total.  2000 each side. Head Size: Medium Treatment #3 of #4  Electronically signed by:  [facility] 11:32 AM 12/14/22

## 2022-12-21 ENCOUNTER — Ambulatory Visit (INDEPENDENT_AMBULATORY_CARE_PROVIDER_SITE_OTHER): Payer: Self-pay | Admitting: Family Medicine

## 2022-12-21 DIAGNOSIS — M76899 Other specified enthesopathies of unspecified lower limb, excluding foot: Secondary | ICD-10-CM

## 2022-12-21 NOTE — Patient Instructions (Signed)
Thank you for coming in today.   We will work to get [employer] approved.  Check back next week

## 2022-12-21 NOTE — Progress Notes (Signed)
   [facility] [address] [place] [address] Phone: [phone]   Extracorporeal Shockwave Therapy Note    Patient is being treated today with ECSWT. Informed consent was obtained and patient tolerated procedure well.   Therapy performed by [name]  Condition treated: Bilateral quadriceps tendinitis Treatment preset used: Tendinitis Energy used: 120 mJ Frequency used: 14 Hz Number of pulses: 4000 total.  2000 each side. Head Size: Medium Treatment #4 of #4  Electronically signed by:  [facility] 12:27 PM 12/21/22  Patient was scheduled next week for left shoulder injection and left knee Durolane injection.

## 2022-12-22 NOTE — Telephone Encounter (Signed)
Per Dr. Denyse Amass - Denyse Amass, Michel Harrow, MD  Dierdre Searles, CMA He will be due for a repeat Durlane injection left knee Oct 2nd. Please auth when able.  See below for coverage information.

## 2022-12-28 ENCOUNTER — Ambulatory Visit: Payer: PPO | Admitting: Family Medicine

## 2022-12-28 ENCOUNTER — Other Ambulatory Visit: Payer: Self-pay

## 2022-12-28 VITALS — BP 106/80 | HR 58 | Ht 70.0 in | Wt 164.0 lb

## 2022-12-28 DIAGNOSIS — M25512 Pain in left shoulder: Secondary | ICD-10-CM | POA: Diagnosis not present

## 2022-12-28 DIAGNOSIS — G8929 Other chronic pain: Secondary | ICD-10-CM | POA: Diagnosis not present

## 2022-12-28 DIAGNOSIS — M1712 Unilateral primary osteoarthritis, left knee: Secondary | ICD-10-CM | POA: Diagnosis not present

## 2022-12-28 DIAGNOSIS — M25562 Pain in left knee: Secondary | ICD-10-CM

## 2022-12-28 MED ORDER — SODIUM HYALURONATE 60 MG/3ML IX PRSY
60.0000 mg | PREFILLED_SYRINGE | Freq: Once | INTRA_ARTICULAR | Status: AC
Start: 2022-12-28 — End: 2022-12-28
  Administered 2022-12-28: 60 mg via INTRA_ARTICULAR

## 2022-12-28 NOTE — Telephone Encounter (Signed)
Pt received Durolane injection for LEFT knee OA on 12/28/22.  Can consider repeat injection on or after 06/29/23.

## 2022-12-28 NOTE — Progress Notes (Signed)
[name], PhD, LAT, ATC acting as a scribe for [name], MD.  Nathan Rodriguez is a 77 y.o. male who presents to [facility] at [facility] today for cont'd L shoulder pain. Pt's last visit w/ Dr. Denyse Amass for his L shoulder was on 09/26/22 and was given a repeat L GH steroid injection.  Today, pt reports L shoulder pain is just started to hurt him again. He feels like every 59-month steroid injections is helpful. He thinks the ECSWT was helpful for his knee pain and is he wanting to do the durolane injection today.   Dx imaging: 06/16/2022 L shoulder MRI 05/23/2022 C-spine XR 12/27/21 L shoulder XR   Pertinent review of systems: No fevers or chills  Relevant historical information: History of pulmonary embolism following knee replacement.   Exam:  BP 106/80   Pulse (!) 58   Ht 5\' 10"  (1.778 m)   Wt 164 lb (74.4 kg)   SpO2 96%   BMI 23.53 kg/m  General: Well Developed, well nourished, and in no acute distress.   MSK: Left shoulder mature scar superior anterior shoulder.  Otherwise relatively normal. Normal motion.  Left knee mild effusion.  Otherwise normal-appearing    Lab and Radiology Results  Procedure: Real-time Ultrasound Guided Injection of left shoulder glenohumeral joint posterior approach Device: Philips Affiniti 50G/GE Logiq Images permanently stored and available for review in PACS Verbal informed consent obtained.  Discussed risks and benefits of procedure. Warned about infection, bleeding, hyperglycemia damage to structures among others. Patient expresses understanding and agreement Time-out conducted.   Noted no overlying erythema, induration, or other signs of local infection.   Skin prepped in a sterile fashion.   Local anesthesia: Topical Ethyl chloride.   With sterile technique and under real time ultrasound guidance: 40 mg of Kenalog and 2 mL of Marcaine injected into glenohumeral joint. Fluid seen entering the joint capsule.    Completed without difficulty   Pain immediately resolved suggesting accurate placement of the medication.   Advised to call if fevers/chills, erythema, induration, drainage, or persistent bleeding.   Images permanently stored and available for review in the ultrasound unit.  Impression: Technically successful ultrasound guided injection.    Nathan Rodriguez presents to clinic today for Durolane injection left knee 1/1 Procedure: Real-time Ultrasound Guided Injection of left knee joint superior lateral patellar space Device: Philips Affiniti 50G/GE Logiq Images permanently stored and available for review in PACS Verbal informed consent obtained.  Discussed risks and benefits of procedure. Warned about infection, bleeding, damage to structures among others. Patient expresses understanding and agreement Time-out conducted.   Noted no overlying erythema, induration, or other signs of local infection.   Skin prepped in a sterile fashion.   Local anesthesia: Topical Ethyl chloride.   With sterile technique and under real time ultrasound guidance: Durolane 60 mg injected into knee joint. Fluid seen entering the joint capsule.   Completed without difficulty   Advised to call if fevers/chills, erythema, induration, drainage, or persistent bleeding.   Images permanently stored and available for review in the ultrasound unit.  Impression: Technically successful ultrasound guided injection. Lot number: [id number]      Assessment and Plan: 77 y.o. male with chronic left shoulder pain and chronic left knee pain both due to DJD.  Plan for conventional steroid injection left glenohumeral joint.  Can repeat this every 3 months if needed.  Left knee: Plan for Durolane injection now.  Can repeat this every 6 months.  If this is not effective could consider geniculate artery embolization.   PDMP not reviewed this encounter. Orders Placed This Encounter  Procedures   [place] LIMITED JOINT SPACE STRUCTURES  UP LEFT(NO LINKED CHARGES)    Order Specific Question:   Reason for Exam (SYMPTOM  OR DIAGNOSIS REQUIRED)    Answer:   left shoulder pain    Order Specific Question:   Preferred imaging location?    Answer:   [facility] ordered this encounter  Medications   Sodium Hyaluronate (DUROLANE) intra-articular injection 60 mg     Discussed warning signs or symptoms. Please see discharge instructions. Patient expresses understanding.   The above documentation has been reviewed and is accurate and complete [name], M.D.

## 2022-12-28 NOTE — Patient Instructions (Signed)
Thank you for coming in today.   You received an injection today. Seek immediate medical attention if the joint becomes red, extremely painful, or is oozing fluid.   I can repeat the shoulder injection in about 3 months  I can repeat the Durolane injection in your knee in 6 months.

## 2023-02-22 ENCOUNTER — Ambulatory Visit (INDEPENDENT_AMBULATORY_CARE_PROVIDER_SITE_OTHER): Payer: PPO

## 2023-02-22 ENCOUNTER — Encounter: Payer: Self-pay | Admitting: Family Medicine

## 2023-02-22 ENCOUNTER — Ambulatory Visit: Payer: PPO | Admitting: Family Medicine

## 2023-02-22 ENCOUNTER — Telehealth: Payer: Self-pay

## 2023-02-22 ENCOUNTER — Other Ambulatory Visit: Payer: Self-pay

## 2023-02-22 VITALS — BP 120/82 | HR 72 | Ht 70.0 in | Wt 167.0 lb

## 2023-02-22 DIAGNOSIS — G8929 Other chronic pain: Secondary | ICD-10-CM

## 2023-02-22 DIAGNOSIS — M25512 Pain in left shoulder: Secondary | ICD-10-CM

## 2023-02-22 NOTE — Telephone Encounter (Signed)
Called pt and advised that I found a [employer] savings card for Nathan Rodriguez with cost around $650 at [employer].   Pt will consider this option and call back if he would like scripts sent in.

## 2023-02-22 NOTE — Patient Instructions (Addendum)
Thank you for coming in today.   Please get an Xray today before you leave   You received an injection today. Seek immediate medical attention if the joint becomes red, extremely painful, or is oozing fluid.   We will let you know how much the Zilretta will cost.

## 2023-02-22 NOTE — Progress Notes (Signed)
I, Nathan Rodriguez, CMA acting as a scribe for Nathan Graham, MD.  Nathan Rodriguez is a 77 y.o. male who presents to [facility] at [facility] today for cont'd L shoulder pain. Pt was last seen by Dr. Denyse Rodriguez on 12/28/22 and was given a L GH steroid injection.  Today, pt reports worsening left shoulder pain over the past week or two. Has tried Tylenol, heat, and ice with minimal relief. Locates pain to posterior aspect of the shoulder. Denies radicular sx. Mechanical sx present.   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   Exam:  BP 120/82   Pulse 72   Ht 5\' 10"  (1.778 m)   Wt 167 lb (75.8 kg)   SpO2 94%   BMI 23.96 kg/m  General: Well Developed, well nourished, and in no acute distress.   MSK: Left shoulder mature scar superior shoulder.  Nontender intact range of motion.    Lab and Radiology Results  Procedure: Real-time Ultrasound Guided Injection of left shoulder subacromial bursa Device: Philips Affiniti 50G/GE Logiq Images permanently stored and available for review in PACS Verbal informed consent obtained.  Discussed risks and benefits of procedure. Warned about infection, bleeding, hyperglycemia damage to structures among others. Patient expresses understanding and agreement Time-out conducted.   Noted no overlying erythema, induration, or other signs of local infection.   Skin prepped in a sterile fashion.   Local anesthesia: Topical Ethyl chloride.   With sterile technique and under real time ultrasound guidance: 40 mg of Kenalog and 2 mL of Marcaine injected into subacromial bursa. Fluid seen entering the bursa.   Completed without difficulty   Pain moderately resolved suggesting accurate placement of the medication.   Advised to call if  fevers/chills, erythema, induration, drainage, or persistent bleeding.   Images permanently stored and available for review in the ultrasound unit.  Impression: Technically successful ultrasound guided injection.    X-ray images left shoulder obtained today personally and independently interpreted. Very glenohumeral DJD.  No acute fractures are visible.  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 male with left shoulder pain.  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  Unfortunately had a steroid injection into the glenohumeral joint 2 months ago and can have another 1 for another month.  Will proceed with subacromial injection today as that may help.  Unfortunately recommend need to find a longer term solution for her rather than conventional interarticular steroid injections.  In the knee gel shots or Zilretta shots would be an option but these are not approved for the shoulder.  I am willing to do a Zilretta shot in the shoulder if he is willing to pay for the medicine out-of-pocket which will be around $800.  We could try PRP as well.  He is adamant that he does not want a left shoulder replacement as he had such a rough time with his right shoulder replacement.  Will get some idea of cost to him and he can make an informed decision of what we do next.  I would recommend Zilretta if possible.   PDMP not reviewed this encounter. Orders Placed This Encounter  Procedures   [place] LIMITED JOINT SPACE STRUCTURES UP LEFT(NO LINKED CHARGES)  Order Specific Question:   Reason for Exam (SYMPTOM  OR DIAGNOSIS REQUIRED)    Answer:   left shoulder pain    Order Specific Question:   Preferred imaging location?    Answer:   [facility] Shoulder Left    Standing Status:   Future    Number of Occurrences:   1    Standing Expiration Date:    03/25/2023    Order Specific Question:   Reason for Exam (SYMPTOM  OR DIAGNOSIS REQUIRED)    Answer:   left shoulder pain    Order Specific Question:   Preferred imaging location?    Answer:   [facility]   No orders of the defined types were placed in this encounter.    Discussed warning signs or symptoms. Please see discharge instructions. Patient expresses understanding.   The above documentation has been reviewed and is accurate and complete Nathan Rodriguez, M.D.

## 2023-02-23 ENCOUNTER — Telehealth: Payer: Self-pay

## 2023-02-23 NOTE — Telephone Encounter (Signed)
Pt called to discuss PRP vs Zilretta.   Discussed pros and cons of Zilretta vs PRP for shoulder sx.   Pt would like to wait until we receive the results back from the XR of his shoulder to decide which way he would like to proceed. Will plan to touch base once XR results have been received.

## 2023-03-02 NOTE — Telephone Encounter (Signed)
I've contacted the Zilretta rep about possible samples of Zilretta.

## 2023-03-03 ENCOUNTER — Other Ambulatory Visit (HOSPITAL_COMMUNITY): Payer: Self-pay | Admitting: General Practice

## 2023-03-03 DIAGNOSIS — R0789 Other chest pain: Secondary | ICD-10-CM

## 2023-03-06 NOTE — Telephone Encounter (Signed)
Called and spoke with patient. I let him know that I was trying to see if we could get a sample to use for his injection. We will plan to touch base on Wednesday about the sample. Pt would like to get injection done before the holidays if possible. If we are unable to obtain sample, he would like for rx to be sent in. I let him know that is we are able to get the sample after the holidays, we can hold it for him for the next injection.   Pt verbalized understanding. I will plan to touch base with pt again on Wednesday to finalize the plan. We've placed a hold on the schedule for 12/19 at 1:15 PM as possible injections date/time.

## 2023-03-06 NOTE — Progress Notes (Signed)
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  An old collarbone fracture is still visible.

## 2023-03-06 NOTE — Telephone Encounter (Signed)
Pt called, xray results are back and he is ready to schedule for an injection but not sure which one. Wanted to talk with [facility].

## 2023-03-07 ENCOUNTER — Telehealth: Payer: Self-pay | Admitting: Cardiovascular Disease

## 2023-03-07 DIAGNOSIS — I2510 Atherosclerotic heart disease of native coronary artery without angina pectoris: Secondary | ICD-10-CM

## 2023-03-07 NOTE — Telephone Encounter (Signed)
New Message:     Patient would like for Krysten  to please give him a call. He says he needs to talk to her about his medicine.   Pt c/o medication issue:  1. Name of Medication: Repatha  2. How are you currently taking this medication (dosage and times per day)?   3. Are you having a reaction (difficulty breathing--STAT)?   4. What is your medication issue? He needs to talk to [place] about his Repatha

## 2023-03-07 NOTE — Telephone Encounter (Signed)
Spoke with pt regarding a few issues. Pt is calling to check on his [employer]. Pt was approved for 3 months back in August. Will send to PharmD pool regarding this. Pt wants to make sure he is going to be covered. If [employer] is not available we will reapply with [employer] for the new year.  Pt also wants to discuss coronary CTA that was scheduled for tomorrow, however ordering provider (pt's PCP- Dr. Theophilus Kinds) was unaware that previous heart stents would make this test contraindicated. Pt did receive a call today to cancel that scan. Pt is seeking clearance for high intensity training/bike riding that he would like to start at the beginning of the year. Explained to pt that there is another scan that we could do for him called Cardiac Stress PET. Advised that I would find out next available date for this to work on getting scheduled. Pt also mentions that he would like to transfer all of his cardiac care to Dr. Allyson Sabal after losing his cardiology PA, Arnette Felts. Pt will have all testing done in the last year faxed over to our office. Advised pt that we would reach out to him tomorrow with additional information.

## 2023-03-08 ENCOUNTER — Ambulatory Visit (HOSPITAL_COMMUNITY): Payer: PPO

## 2023-03-08 NOTE — Telephone Encounter (Signed)
Pt returned call and was advised that we have not yet received Zilretta samples from the rep.   Pt would like to proceed with Zilretta inj and is agreeable to saying out of pocket cost. Zilretta kit pulled from office supply. Pt has been added to the scheduled for 12/19/24at 1:15 PM.

## 2023-03-08 NOTE — Telephone Encounter (Signed)
Called pt and left VM to call the office.  

## 2023-03-08 NOTE — Telephone Encounter (Signed)
Called pt and informed him his [employer] grant was good into July. All other questions answered.

## 2023-03-09 ENCOUNTER — Other Ambulatory Visit: Payer: Self-pay

## 2023-03-09 ENCOUNTER — Ambulatory Visit: Payer: Self-pay | Admitting: Family Medicine

## 2023-03-09 DIAGNOSIS — G8929 Other chronic pain: Secondary | ICD-10-CM

## 2023-03-09 DIAGNOSIS — M25512 Pain in left shoulder: Secondary | ICD-10-CM

## 2023-03-09 NOTE — Progress Notes (Signed)
Left shoulder PRP injection  Nathan Rodriguez presents to clinic today for left shoulder glenohumeral injection with leukocyte depleted PRP.  Using ultrasound guidance 6 mL of leukocyte depleted PRP injected into the glenohumeral joint using the posterior approach

## 2023-03-09 NOTE — Telephone Encounter (Signed)
Late documentation: called pt yesterday at 11:34am to discuss availability for doing Cardiac Stress PET. Pt is agreeable to move forward with procedure. Pt is scheduled for 03/23/23 at 12pm. Instructions discussed with pt, instructions printed and mailed to pt's home address. Also, scheduled pt with return office visit with Dr. Allyson Sabal. Pt has already spoken with PharmD regarding [employer]. Pt will have Dr. Dyke Brackett office send over all documentation from the last year. Pt verbalizes understanding.

## 2023-03-09 NOTE — Patient Instructions (Signed)
Thank you for coming in today.   Take it easy.  If not better we can try Zilretta by prescribing it to her pharmacy.

## 2023-03-20 ENCOUNTER — Telehealth (HOSPITAL_COMMUNITY): Payer: Self-pay | Admitting: Emergency Medicine

## 2023-03-20 NOTE — Telephone Encounter (Signed)
Reaching out to patient to offer assistance regarding upcoming cardiac imaging study; pt verbalizes understanding of appt date/time, parking situation and where to check in, pre-test NPO status and medications ordered, and verified current allergies; name and call back number provided for further questions should they arise Cayne Yom RN Navigator Cardiac Imaging [facility] [phone] office [phone] cell 

## 2023-03-23 ENCOUNTER — Ambulatory Visit (HOSPITAL_COMMUNITY)
Admission: RE | Admit: 2023-03-23 | Discharge: 2023-03-23 | Disposition: A | Discharge status: Home / Self Care | Payer: PPO | Source: Ambulatory Visit | Attending: Cardiovascular Disease | Admitting: Cardiovascular Disease

## 2023-03-23 DIAGNOSIS — I2510 Atherosclerotic heart disease of native coronary artery without angina pectoris: Secondary | ICD-10-CM | POA: Diagnosis present

## 2023-03-23 LAB — NM PET CT CARDIAC PERFUSION MULTI W/ABSOLUTE BLOODFLOW
LV dias vol: 82 mL (ref 62–150)
LV sys vol: 34 mL
MBFR: 2.83
Nuc Rest EF: 50 %
Nuc Stress EF: 59 %
Peak HR: 71 {beats}/min
Rest HR: 71 {beats}/min
Rest MBF: 0.59 ml/g/min
Rest Nuclear Isotope Dose: 20 mCi
ST Depression (mm): 0 mm
Stress MBF: 1.67 ml/g/min
Stress Nuclear Isotope Dose: 20 mCi
TID: 0.92

## 2023-03-23 MED ORDER — RUBIDIUM RB82 GENERATOR (RUBYFILL)
19.9900 | PACK | Freq: Once | INTRAVENOUS | Status: AC
  Administered 2023-03-23: 19.99 via INTRAVENOUS

## 2023-03-23 MED ORDER — RUBIDIUM RB82 GENERATOR (RUBYFILL)
19.9500 | PACK | Freq: Once | INTRAVENOUS | Status: AC
  Administered 2023-03-23: 19.95 via INTRAVENOUS

## 2023-03-23 MED ORDER — REGADENOSON 0.4 MG/5ML IV SOLN
INTRAVENOUS | Status: AC
  Filled 2023-03-23: qty 5

## 2023-03-23 MED ORDER — REGADENOSON 0.4 MG/5ML IV SOLN
0.4000 mg | Freq: Once | INTRAVENOUS | Status: AC
  Administered 2023-03-23: 0.4 mg via INTRAVENOUS

## 2023-03-29 ENCOUNTER — Ambulatory Visit: Payer: PPO | Attending: Cardiovascular Disease | Admitting: Cardiovascular Disease

## 2023-03-29 ENCOUNTER — Encounter: Payer: Self-pay | Admitting: Cardiovascular Disease

## 2023-03-29 VITALS — BP 140/90 | HR 66 | Ht 70.5 in | Wt 160.0 lb

## 2023-03-29 DIAGNOSIS — I82432 Acute embolism and thrombosis of left popliteal vein: Secondary | ICD-10-CM

## 2023-03-29 DIAGNOSIS — E782 Mixed hyperlipidemia: Secondary | ICD-10-CM

## 2023-03-29 DIAGNOSIS — R000 Tachycardia, unspecified: Secondary | ICD-10-CM

## 2023-03-29 DIAGNOSIS — I7121 Aneurysm of the ascending aorta, without rupture: Secondary | ICD-10-CM

## 2023-03-29 DIAGNOSIS — E785 Hyperlipidemia, unspecified: Secondary | ICD-10-CM | POA: Diagnosis not present

## 2023-03-29 DIAGNOSIS — I10 Essential (primary) hypertension: Secondary | ICD-10-CM

## 2023-03-29 DIAGNOSIS — I712 Thoracic aortic aneurysm, without rupture, unspecified: Secondary | ICD-10-CM | POA: Insufficient documentation

## 2023-03-29 DIAGNOSIS — I2510 Atherosclerotic heart disease of native coronary artery without angina pectoris: Secondary | ICD-10-CM

## 2023-03-29 NOTE — Assessment & Plan Note (Signed)
 History of CAD status post rotational atherectomy, PCI and drug-eluting stenting x 2 by Dr. Charlena Sor 11/03/2010 of his LAD.  I catheterized him 08/31/2015 revealing a widely patent stent with no other significant CAD.  He is very active and does competitive bicycling.  He had a recent cardiac PET study performed 03/23/2023 which was entirely normal.  He is completely asymptomatic.

## 2023-03-29 NOTE — Patient Instructions (Signed)
 Medication Instructions:  Your physician recommends that you continue on your current medications as directed. Please refer to the Current Medication list given to you today.  *If you need a refill on your cardiac medications before your next appointment, please call your pharmacy*   Lab Work: Your physician recommends that you return for lab work in: 6 months for FASTING lipid/liver panel  If you have labs (blood work) drawn today and your tests are completely normal, you will receive your results only by: MyChart Message (if you have MyChart) OR A paper copy in the mail If you have any lab test that is abnormal or we need to change your treatment, we will call you to review the results.   Testing/Procedures: Your physician has requested that you have an echocardiogram. Echocardiography is a painless test that uses sound waves to create images of your heart. It provides your doctor with information about the size and shape of your heart and how well your heart's chambers and valves are working. This procedure takes approximately one hour. There are no restrictions for this procedure. Please do NOT wear cologne, perfume, aftershave, or lotions (deodorant is allowed). Please arrive 15 minutes prior to your appointment time. **To be done in January 2026**  Please note: We ask at that you not bring children with you during ultrasound (echo/ vascular) testing. Due to room size and safety concerns, children are not allowed in the ultrasound rooms during exams. Our front office staff cannot provide observation of children in our lobby area while testing is being conducted. An adult accompanying a patient to their appointment will only be allowed in the ultrasound room at the discretion of the ultrasound technician under special circumstances. We apologize for any inconvenience.    Follow-Up: At [facility], you and your health needs are our priority.  As part of our continuing mission to  provide you with exceptional heart care, we have created designated Provider Care Teams.  These Care Teams include your primary Cardiologist (physician) and Advanced Practice Providers (APPs -  Physician Assistants and Nurse Practitioners) who all work together to provide you with the care you need, when you need it.  We recommend signing up for the patient portal called MyChart.  Sign up information is provided on this After Visit Summary.  MyChart is used to connect with patients for Virtual Visits (Telemedicine).  Patients are able to view lab/test results, encounter notes, upcoming appointments, etc.  Non-urgent messages can be sent to your provider as well.   To learn more about what you can do with MyChart, go to [email or web address].    Your next appointment:   6 month(s)  Provider:   Callie Goodrich, PA-C, Kathleen Johnson, PA-C, Hao Meng, PA-C, Damien Braver, NP, or Katlyn West, NP       Then, Dorn Lesches, MD will plan to see you again in 12 month(s).

## 2023-03-29 NOTE — Assessment & Plan Note (Signed)
 History of hyperlipidemia.  Intolerant to statin therapy on Repatha .  His most recent lipid profile performed 03/03/2023 revealed total cholesterol 178, LDL of 95 and HDL of 68.  This represents a significant increase compared to his last lipid profile performed 08/08/2022 at which time his LDL was 79.  Will recheck a lipid profile in 6 months.

## 2023-03-29 NOTE — Assessment & Plan Note (Signed)
 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

## 2023-03-29 NOTE — Assessment & Plan Note (Signed)
 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  This will be rechecked in 1 year by 2D echocardiography.

## 2023-03-29 NOTE — Assessment & Plan Note (Signed)
 History of essential hypertension her blood pressure measured today at 140/90.  Usually is much lower than this.  He is on metoprolol.

## 2023-03-29 NOTE — Assessment & Plan Note (Signed)
 History of palpitations currently improved on beta-blockade.

## 2023-03-29 NOTE — Progress Notes (Signed)
 03/29/2023 Elsie LELON Donovan   May 20, 1945  [id number]  Primary Physician Beverley Norleen NOVAK, MD Primary Cardiologist: Dorn JINNY Lesches MD GENI CODY MADEIRA, FSCAI  HPI:  TYMAR POLYAK is a 78 y.o.  thin and fit-appearing married Caucasian male father of one daughter who is a retired electrical engineer. He was referred by Garrel Kubas [facility] for cardiovascular evaluation because of progressive dyspnea .  I last saw him in the office 10/21/2022.SABRA  He has a history of CAD status post rotational atherectomy, PCI and drug eluting stenting 2 by Dr. Charlena Sor 11/03/2010 of his LAD. He is a international aid/development worker as noted progressive increasing dyspnea on exertion and decreased exercise tolerance over the last several weeks. This resected include hyperlipidemia not on statin therapy, history of tobacco abuse smoking 5-6 cigarettes a day off and on for the last 40 years.There is a family history of a father who had bypass surgery at age 72.  I performed left heart cath on him 08/31/2015 revealing a widely patent stent, no significant CAD with normal LV function.  Right after that he was diagnosed with a DVT and PE was placed on Xarelto .  His symptoms of dyspnea resolved.  He is being sent back now by Garrel Kubas PA-C because of elevated heart rate with exercise and some dyspnea.   I obtained a 2D echocardiogram on him 03/23/2018 which was essentially normal and a Myoview  stress test that showed diaphragmatic attenuation.  He did see Dr. Inocencio in the office for tachycardia and an event monitor showed short runs of nonsustained ventricular tachycardia and atrial tachycardia.  He was prescribed metoprolol  twice daily.  His tachycardic episodes have markedly improved.  He is no longer short of breath and is able to exercise without limitation.  He denies chest pain.  Our pharmacist Raquel is also working with him to initiate Repatha  therapy for his hyperlipidemia and statin intolerance.   Since I saw him in the office 4 months  ago he continues to do well.  He apparently is a competitive cyclist and needed clearance to participate in his aerobic/exercise activities.  He does workout 5 days a week and is completely asymptomatic.  He had a study performed 03/23/2023 which was low risk and nonischemic.  It did show a small ascending thoracic aortic aneurysm measuring 41 x 42 mm.   Current Meds  Medication Sig   acetaminophen  (TYLENOL ) 500 MG tablet Take 500 mg by mouth every 6 (six) hours as needed.   aluminum  hydroxide-magnesium  carbonate (GAVISCON) 95-358 MG/15ML SUSP Take 15 mLs by mouth as needed for indigestion or heartburn.   ANUCORT-HC  25 MG suppository Place 25 mg rectally at bedtime as needed for itching.   docusate sodium  (COLACE) 100 MG capsule Take 1 capsule (100 mg total) by mouth 2 (two) times daily.   Evolocumab  (REPATHA  SURECLICK) 140 MG/ML SOAJ INJECT 1 PEN UNDER THE SKIN EVERY 14 DAYS   hydrocortisone  valerate cream (WESTCORT ) 0.2 % Apply 1 application topically 2 (two) times daily as needed (irritation).   metoprolol  tartrate (LOPRESSOR ) 25 MG tablet Take 1 tablet by mouth once daily   Multiple Vitamin (MULTIVITAMIN WITH MINERALS) TABS tablet Take 1 tablet by mouth daily.   Omega-3 1000 MG CAPS Take 1,000 mg by mouth daily.   omeprazole (PRILOSEC) 20 MG capsule Take 20 mg by mouth daily.   rivaroxaban  (XARELTO ) 20 MG TABS tablet TAKE 1 TABLET BY MOUTH ONCE DAILY WITH SUPPER   rOPINIRole  (REQUIP ) 0.25 MG tablet  Take 1 tablet (0.25 mg total) by mouth at bedtime.   Wheat Dextrin (BENEFIBER DRINK MIX PO) Take 1 packet by mouth in the morning and at bedtime.     No Known Allergies  Social History   Socioeconomic History   Marital status: Married    Spouse name: Not on file   Number of children: Not on file   Years of education: Not on file   Highest education level: Not on file  Occupational History   Not on file  Tobacco Use   Smoking status: Former    Current packs/day: 0.00    Average  packs/day: 0.3 packs/day for 43.0 years (10.8 ttl pk-yrs)    Types: Cigarettes    Start date: 03/22/1959    Quit date: 03/21/2002    Years since quitting: 21.0   Smokeless tobacco: Never  Vaping Use   Vaping status: Never Used  Substance and Sexual Activity   Alcohol use: Yes    Comment: rare   Drug use: No   Sexual activity: Not Currently  Other Topics Concern   Not on file  Social History Narrative   Not on file   Social Drivers of Health   Financial Resource Strain: Not on file  Food Insecurity: Not on file  Transportation Needs: Not on file  Physical Activity: Not on file  Stress: Not on file  Social Connections: Unknown (08/01/2021)   Received from [facility]   Social Network    Social Network: Not on file  Intimate Partner Violence: Unknown (06/23/2021)   Received from [facility]   HITS    Physically Hurt: Not on file    Insult or Talk Down To: Not on file    Threaten Physical Harm: Not on file    Scream or Curse: Not on file     Review of Systems: General: negative for chills, fever, night sweats or weight changes.  Cardiovascular: negative for chest pain, dyspnea on exertion, edema, orthopnea, palpitations, paroxysmal nocturnal dyspnea or shortness of breath Dermatological: negative for rash Respiratory: negative for cough or wheezing Urologic: negative for hematuria Abdominal: negative for nausea, vomiting, diarrhea, bright red blood per rectum, melena, or hematemesis Neurologic: negative for visual changes, syncope, or dizziness All other systems reviewed and are otherwise negative except as noted above.    Blood pressure (!) 140/90, pulse 66, height 5' 10.5 (1.791 m), weight 160 lb (72.6 kg), SpO2 97%.  General appearance: alert and no distress Neck: no adenopathy, no carotid bruit, no JVD, supple, symmetrical, trachea midline, and thyroid not enlarged, symmetric, no tenderness/mass/nodules Lungs: clear to auscultation  bilaterally Heart: regular rate and rhythm, S1, S2 normal, no murmur, click, rub or gallop Extremities: extremities normal, atraumatic, no cyanosis or edema Pulses: 2+ and symmetric Skin: Skin color, texture, turgor normal. No rashes or lesions Neurologic: Grossly normal  EKG not performed today      ASSESSMENT AND PLAN:   Coronary artery disease involving native coronary artery of native heart without angina pectoris History of CAD status post rotational atherectomy, PCI and drug-eluting stenting x 2 by Dr. Charlena Sor 11/03/2010 of his LAD.  I catheterized him 08/31/2015 revealing a widely patent stent with no other significant CAD.  He is very active and does competitive bicycling.  He had a recent cardiac PET study performed 03/23/2023 which was entirely normal.  He is completely asymptomatic.  Hyperlipidemia History of hyperlipidemia.  Intolerant to statin therapy on Repatha .  His most recent lipid profile  performed 03/03/2023 revealed total cholesterol 178, LDL of 95 and HDL of 68.  This represents a significant increase compared to his last lipid profile performed 08/08/2022 at which time his LDL was 79.  Will recheck a lipid profile in 6 months.  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  Hypertension History of essential hypertension her blood pressure measured today at 140/90.  Usually is much lower than this.  He is on metoprolol .  Tachycardia History of palpitations currently improved on beta-blockade.  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  This will be rechecked in 1 year by 2D echocardiography.     Dorn DOROTHA Lesches MD FACP,FACC,FAHA, [facility] 03/29/2023 12:13 PM

## 2023-04-27 ENCOUNTER — Telehealth: Payer: Self-pay | Admitting: Family Medicine

## 2023-04-27 NOTE — Telephone Encounter (Signed)
 Patient called stating that he is having reoccurring left shoulder pain and asked when he would be able to get another cortisone injection? He had PRP in that shoulder on 03/08/24.  Please advise.  (Okay to leave a message)

## 2023-04-28 NOTE — Telephone Encounter (Signed)
 We can do it starting next week

## 2023-04-28 NOTE — Telephone Encounter (Signed)
 Pt scheduled for Monday.

## 2023-04-28 NOTE — Telephone Encounter (Signed)
 Please assist pt with scheduling for some time next week. Thank you!

## 2023-05-01 ENCOUNTER — Other Ambulatory Visit: Payer: Self-pay

## 2023-05-01 ENCOUNTER — Encounter: Payer: Self-pay | Admitting: Family Medicine

## 2023-05-01 ENCOUNTER — Ambulatory Visit: Payer: PPO | Admitting: Family Medicine

## 2023-05-01 VITALS — BP 122/80 | HR 78 | Ht 70.5 in | Wt 167.0 lb

## 2023-05-01 DIAGNOSIS — M25512 Pain in left shoulder: Secondary | ICD-10-CM

## 2023-05-01 DIAGNOSIS — G8929 Other chronic pain: Secondary | ICD-10-CM | POA: Diagnosis not present

## 2023-05-01 NOTE — Progress Notes (Signed)
   I, Miquel Amen, CMA acting as a scribe for [name], MD.  Nathan Rodriguez is a 78 y.o. male who presents to [facility] today for cont'd L shoulder pain. Pt was last seen by Dr.Irene Mitcham on 03/09/23 and had a L shoulder PRP injection. Last L subacromial steroid injection, 02/22/23. Last L GH steroid injection, 12/28/22.  Today, pt reports some improvement after PRP injection. Sx have progressively worsened over the past few weeks. Today is a pretty bad day. Minimal pain during exercise but the shoulder will ache the following day. Denies n/t I the hand.  Dx imaging: 06/16/2022 L shoulder MRI 05/23/2022 C-spine XR 12/27/21 L shoulder XR  Pertinent review of systems: No fevers or chills  Relevant historical information: Chronic left shoulder pain.  History of VTE.   Exam:  BP 122/80   Pulse 78   Ht 5' 10.5" (1.791 m)   Wt 167 lb (75.8 kg)   SpO2 100%   BMI 23.62 kg/m  General: Well Developed, well nourished, and in no acute distress.   MSK: Left shoulder intact range of motion.    Lab and Radiology Results  Procedure: Real-time Ultrasound Guided Injection of left shoulder glenohumeral joint posterior approach Device: Philips Affiniti 50G/GE Logiq Images permanently stored and available for review in PACS Verbal informed consent obtained.  Discussed risks and benefits of procedure. Warned about infection, bleeding, hyperglycemia damage to structures among others. Patient expresses understanding and agreement Time-out conducted.   Noted no overlying erythema, induration, or other signs of local infection.   Skin prepped in a sterile fashion.   Local anesthesia: Topical Ethyl chloride.   With sterile technique and under real time ultrasound guidance: 40 mg of Kenalog  and 2 mL of Marcaine  injected into glenohumeral joint. Fluid seen entering the joint capsule.   Completed without difficulty   Pain immediately resolved suggesting accurate placement  of the medication.   Advised to call if fevers/chills, erythema, induration, drainage, or persistent bleeding.   Images permanently stored and available for review in the ultrasound unit.  Impression: Technically successful ultrasound guided injection.        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  Plan for conventional interarticular steroid injection today.  Will consider Zilretta  in the future for the shoulder but he like to pay for the medicine himself as insurance would not cover it.   PDMP not reviewed this encounter. Orders Placed This Encounter  Procedures   US  LIMITED JOINT SPACE STRUCTURES UP LEFT(NO LINKED CHARGES)    Reason for Exam (SYMPTOM  OR DIAGNOSIS REQUIRED):   left shoulder pain    Preferred imaging location?:   [facility]   No orders of the defined types were placed in this encounter.    Discussed warning signs or symptoms. Please see discharge instructions. Patient expresses understanding.   The above documentation has been reviewed and is accurate and complete [name], M.D.

## 2023-05-01 NOTE — Patient Instructions (Addendum)
 Thank you for coming in today.  You received an injection today. Seek immediate medical attention if the joint becomes red, extremely painful, or is oozing fluid.  We will try the zilretta  next time.

## 2023-05-03 ENCOUNTER — Telehealth: Payer: Self-pay

## 2023-05-03 ENCOUNTER — Other Ambulatory Visit (HOSPITAL_COMMUNITY): Payer: Self-pay

## 2023-05-03 NOTE — Telephone Encounter (Signed)
Pharmacy Patient Advocate Encounter   Received notification from CoverMyMeds that prior authorization for REPATHA is required/requested.   Insurance verification completed.   The patient is insured through [facility] ADVANTAGE/RX ADVANCE .   Per test claim: PA required; PA submitted to above mentioned insurance via CoverMyMeds Key/confirmation #/EOC [id number] Status is pending

## 2023-05-08 NOTE — Telephone Encounter (Addendum)
Pharmacy Patient Advocate Encounter  Received notification from [facility] ADVANTAGE/RX ADVANCE that Prior Authorization for REPATHA has been DENIED.  See denial reason below. No denial letter attached in CMM. Will attach denial letter to Media tab once received.   DENIED OVER LABS. SUBMITTED 08/08/22 LABS WITH PA REQUEST. PLAN MAY REQUIRE NEWER LABS.

## 2023-05-09 NOTE — Telephone Encounter (Signed)
Patient identification verified by 2 forms. [name], RN    Called and spoke to patient  Informed patient:   -PA denied due to no updated labs   -lab orders placed, can present to lab at earliest convenience   -lab should be completed while fasting  Patient states he will present on Thursday/Friday  Patient has no further questions at this time

## 2023-05-09 NOTE — Telephone Encounter (Signed)
Patient is requesting call back to discuss update on grant for this medication. Please advise.

## 2023-05-09 NOTE — Telephone Encounter (Signed)
Dakota with [facility] pharmacy called in stating he will put the refill request on hold for now and asked if someone can call him back once pt has labs and PA is approved.

## 2023-05-10 LAB — HEPATIC FUNCTION PANEL
ALT: 26 [IU]/L (ref 0–44)
AST: 26 [IU]/L (ref 0–40)
Albumin: 4.2 g/dL (ref 3.8–4.8)
Alkaline Phosphatase: 78 [IU]/L (ref 44–121)
Bilirubin Total: 0.4 mg/dL (ref 0.0–1.2)
Bilirubin, Direct: 0.14 mg/dL (ref 0.00–0.40)
Total Protein: 6.9 g/dL (ref 6.0–8.5)

## 2023-05-10 LAB — LIPID PANEL
Chol/HDL Ratio: 3 {ratio} (ref 0.0–5.0)
Cholesterol, Total: 163 mg/dL (ref 100–199)
HDL: 55 mg/dL (ref >39)
LDL Chol Calc (NIH): 88 mg/dL (ref 0–99)
Triglycerides: 111 mg/dL (ref 0–149)
VLDL Cholesterol Cal: 20 mg/dL (ref 5–40)

## 2023-05-11 ENCOUNTER — Encounter: Payer: Self-pay | Admitting: *Deleted

## 2023-05-15 ENCOUNTER — Telehealth: Payer: Self-pay | Admitting: Family Medicine

## 2023-05-15 ENCOUNTER — Other Ambulatory Visit: Payer: Self-pay

## 2023-05-15 DIAGNOSIS — E785 Hyperlipidemia, unspecified: Secondary | ICD-10-CM

## 2023-05-15 MED ORDER — EZETIMIBE 10 MG PO TABS
10.0000 mg | ORAL_TABLET | Freq: Every day | ORAL | 3 refills | Status: AC
Start: 2023-05-15 — End: 2026-09-13

## 2023-05-15 NOTE — Telephone Encounter (Signed)
 We are supposed to be getting some samples in soon, I've touched base with the rep, Dr. Denyse Amass just needs to sign for them. Does pt want to wait until we receive the samples or get the Zilretta through the pharmacy?

## 2023-05-15 NOTE — Telephone Encounter (Signed)
 Patient called stating that he would like to proceed with Zilretta. He said that he had discussed using pharmacy benefits last time because it would be cheaper?  Please advise.

## 2023-05-15 NOTE — Telephone Encounter (Signed)
 Left message informing patient.  Advised that we would call him when medication is in.

## 2023-05-15 NOTE — Telephone Encounter (Signed)
 Spoke with rep, he said typically received within 3-4 business days after they get the signed form.   Form signed and faxed/e-mailed today.

## 2023-05-15 NOTE — Telephone Encounter (Addendum)
 Probably depending on the time frame. He wanted the most cost friendly option.

## 2023-05-16 NOTE — Telephone Encounter (Signed)
 Patient called stating that if the sample is not here by Friday or so, he would like to proceed with ordering it to the pharmacy.   He said that he is having a lot of pain and would like this as soon as he can.

## 2023-05-18 ENCOUNTER — Other Ambulatory Visit: Payer: Self-pay

## 2023-05-18 NOTE — Telephone Encounter (Signed)
 Spoke with Tiffany at [facility] is not on pharmacy benefit formulary. We could send rx to [facility] and use Single Care savings card but this is likely a medication that will need to be ordered and not kept in stock at the pharmacy.

## 2023-05-18 NOTE — Telephone Encounter (Signed)
 Called and spoke with patient to advise that Zilretta samples have been shipped but we cannot confirm that they will arrive in the office by tomorrow. He would like to have rx sent to the pharmacy for Zilretta. Checking with [facility] for OOP cost. Will also check GoodRx and SingleCare for cost savings card.

## 2023-05-18 NOTE — Telephone Encounter (Signed)
 Called and spoke with pt to advise that we will not be able to do injection tomorrow because it's highly unlikely that [facility] will have the medication available.   We will wait for Zilretta samples.

## 2023-05-23 NOTE — Telephone Encounter (Signed)
 Spoke with [employer] with Zilretta, samples have shipped. He is reaching out to customer services for estimated arrival date.

## 2023-05-24 ENCOUNTER — Telehealth: Payer: Self-pay | Admitting: Pharmacy Technician

## 2023-05-24 ENCOUNTER — Telehealth: Payer: Self-pay | Admitting: Cardiovascular Disease

## 2023-05-24 ENCOUNTER — Other Ambulatory Visit (HOSPITAL_COMMUNITY): Payer: Self-pay

## 2023-05-24 NOTE — Telephone Encounter (Signed)
 Pt c/o medication issue:  1. Name of Medication:   Evolocumab (REPATHA SURECLICK) 140 MG/ML SOAJ   2. How are you currently taking this medication (dosage and times per day)?   As prescribed  3. Are you having a reaction (difficulty breathing--STAT)?   4. What is your medication issue?    Patient called to follow-up on the status of his Repatha grant.

## 2023-05-24 NOTE — Telephone Encounter (Signed)
   Rx needs a pa. Kennedy Bucker does say swept but will call if any problems after pa approved

## 2023-05-24 NOTE — Telephone Encounter (Signed)
 We received note about his grant but the repatha was denied for not having labs. I tried to do another pa and it said we needed to appeal. Appeal faxed 05/24/23 12:27pm

## 2023-05-24 NOTE — Telephone Encounter (Signed)
 Patient identification verified by 2 forms. [name], RN    Called and spoke to patient  Informed patient:  -repatha appeal submitted today   -he will be updated once response received  Patient inquired in Zetia is in addition to repatha or replacing it  Informed patient Zetia 10mg  is in addition to repatha  Patient verbalized understanding, no questions at this time

## 2023-05-25 MED ORDER — TRIAMCINOLONE ACETONIDE 32 MG IX SRER: 32.0000 mg | Freq: Once | INTRA_ARTICULAR | 0 refills | Status: AC

## 2023-05-25 NOTE — Telephone Encounter (Signed)
 Pharmacy Patient Advocate Encounter  Received notification from [facility] ADVANTAGE/RX ADVANCE that Prior Authorization for repatha has been APPROVED from 05/24/23 to 05/23/24. Spoke to pharmacy to process.Copay is $0.00.

## 2023-05-25 NOTE — Telephone Encounter (Signed)
 Spoke with [employer], there was an issue with the samples. Though it showed that they were bring shipping and a shipping label had been printed, Elby Beck advised me that there was an issue and unfortunately, Dr. Denyse Amass will not be able to get samples of Zilretta.   Rx for Zilretta has been sent to [address] along with Single Care savings card.

## 2023-05-25 NOTE — Addendum Note (Signed)
 Addended by: Dierdre Searles on: 05/25/2023 02:15 PM   Modules accepted: Orders

## 2023-05-25 NOTE — Telephone Encounter (Signed)
 Called and spoke with patient regarding samples of Zilretta. I let him know that [facility] is unable to order Zilretta.   We will see if Dr. Katrinka Blazing can sign for samples of Zilretta. I will updated pt through MyChart.

## 2023-05-25 NOTE — Telephone Encounter (Signed)
 Called [facility] to confirm that they would be able to order Zilretta. Unfortunately, [facility] is NOT able to order Zilretta.   Self pay cost for Zilretta through [facility] is $842.

## 2023-05-30 ENCOUNTER — Other Ambulatory Visit (HOSPITAL_COMMUNITY): Payer: Self-pay

## 2023-05-30 MED ORDER — TRIAMCINOLONE ACETONIDE 32 MG IX SRER
32.0000 mg | Freq: Once | INTRA_ARTICULAR | 0 refills | Status: AC
  Filled 2023-05-30: qty 1, 1d supply, fill #0

## 2023-05-30 NOTE — Addendum Note (Signed)
 Addended by: Dierdre Searles on: 05/30/2023 07:36 AM   Modules accepted: Orders

## 2023-05-30 NOTE — Telephone Encounter (Deleted)
 Hi Mr. Labreck,  I am going to go ahead and send the prescription for Zilretta in to [facility] so that they will have it if you decide to proceed with that option. You can give them at call at [phone] if you decide to proceed with the self-pay option. They should deliver the medication to the office after speaking with you. I am still waiting to receive the sample request form for Dr. Katrinka Blazing from out Zilretta rep.   Please let me know if you have any questions,   Gearldine Bienenstock ,CMA

## 2023-06-05 NOTE — Telephone Encounter (Signed)
 We were trying to get Zilretta for the shoulder. Visco was for the knee.

## 2023-06-06 NOTE — Telephone Encounter (Signed)
 Scheduled 06/07/23. Sample.

## 2023-06-06 NOTE — Telephone Encounter (Signed)
 Sample received. Patient scheduled for tomorrow.

## 2023-06-07 ENCOUNTER — Other Ambulatory Visit: Payer: Self-pay

## 2023-06-07 ENCOUNTER — Encounter: Payer: Self-pay | Admitting: Family Medicine

## 2023-06-07 ENCOUNTER — Ambulatory Visit: Admitting: Family Medicine

## 2023-06-07 ENCOUNTER — Other Ambulatory Visit: Payer: Self-pay | Admitting: Cardiovascular Disease

## 2023-06-07 VITALS — BP 122/82 | HR 69 | Ht 70.5 in

## 2023-06-07 DIAGNOSIS — G8929 Other chronic pain: Secondary | ICD-10-CM | POA: Diagnosis not present

## 2023-06-07 DIAGNOSIS — M19012 Primary osteoarthritis, left shoulder: Secondary | ICD-10-CM | POA: Diagnosis not present

## 2023-06-07 DIAGNOSIS — M25512 Pain in left shoulder: Secondary | ICD-10-CM

## 2023-06-07 DIAGNOSIS — I82599 Chronic embolism and thrombosis of other specified deep vein of unspecified lower extremity: Secondary | ICD-10-CM

## 2023-06-07 MED ORDER — TRIAMCINOLONE ACETONIDE 32 MG IX SRER
32.0000 mg | Freq: Once | INTRA_ARTICULAR | Status: AC
Start: 2023-06-07 — End: 2023-06-07
  Administered 2023-06-07: 32 mg via INTRA_ARTICULAR

## 2023-06-07 NOTE — Telephone Encounter (Signed)
 Xarelto 20mg  refill request received. Pt is 78 years old, weight-75.8kg, Crea-0.93 on 10/21/22, last seen by Dr. 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 Will send in refill to requested pharmacy.

## 2023-06-07 NOTE — Patient Instructions (Signed)
 Thank you for coming in today.   You received an injection today. Seek immediate medical attention if the joint becomes red, extremely painful, or is oozing fluid.

## 2023-06-07 NOTE — Progress Notes (Signed)
 I, Stevenson Clinch, CMA acting as a scribe for [name], MD.  Nathan Rodriguez is a 78 y.o. male who presents to [facility] today for cont'd L shoulder pain. Pt was last seen by Dr.Larico Dimock on 05/01/23 and he was given a L GH steroid injection.   Today, pt reports minimal change in sx with PRP or steroid injections. Continues to have pain in the shoulder, worsening pain at the top of the shoulder with lateral raises. Also worsening pain when reaching back.  He wonders if stem cells would be helpful for that shoulder.  He would very much like to avoid a shoulder replacement if he had a tough time with his right shoulder replacement.  Dx imaging: 06/16/2022 L shoulder MRI 12/27/21 L shoulder XR  Pertinent review of systems: No fevers or chills  Relevant historical information: History of a DVT and pulmonary embolism   Exam:  BP 122/82   Pulse 69   Ht 5' 10.5" (1.791 m)   SpO2 94%   BMI 23.62 kg/m  General: Well Developed, well nourished, and in no acute distress.   MSK: Left shoulder mature scar superior shoulder.  Excessive motion of the clavicle with shoulder motion secondary to chronic nonunion fracture clavicle.    Lab and Radiology Results   Zilretta injection left shoulder glenohumeral joint posterior approach Procedure: Real-time Ultrasound Guided Injection of left shoulder joint posterior approach e Device: Philips Affiniti 50G Images permanently stored and available for review in PACS Verbal informed consent obtained.  Discussed risks and benefits of procedure. Warned about infection, hyperglycemia bleeding, damage to structures among others. Patient expresses understanding and agreement Time-out conducted.   Noted no overlying erythema, induration, or other signs of local infection.   Skin prepped in a sterile fashion.   Local anesthesia: Topical Ethyl chloride.   With sterile technique and under real time ultrasound guidance:  Zilretta 32 mg injected into shoulder joint. Fluid seen entering the joint capsule.   Completed without difficulty   Advised to call if fevers/chills, erythema, induration, drainage, or persistent bleeding.   Images permanently stored and available for review in the ultrasound unit.  Impression: Technically successful ultrasound guided injection.  Lot number: [id number] Patient used a sample for Zilretta medication no charge for today.     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  Unfortunately he is failing typical conservative management options including physical therapy home exercise program and steroid injections.  Brandy (CMA) in my office was able to get Zilretta sample which we injected into the left glenohumeral joint today.  I am hopeful this will provide better long-lasting benefit similar to how it has worked for him in his knee in the past.  If this is successful provides long-lasting benefit he be willing to buy it himself through his pharmacy benefit and estimated cost of a little bit over $800.  Let me know how this works.  We did talk about stem cells.  There is very little evidence that stem cells do much for even knee arthritis and there is very very little evidence for stem cells in the shoulder.  He would require a bone marrow aspiration to get the stem cells and it would cost him around $8000 to have the treatment.  I do not think this is a good idea and unlikely to be much benefit.  We do not have enough evidence to say that it would help. Ultimately he  may require shoulder replacement which he is looking to avoid.  PDMP not reviewed this encounter. Orders Placed This Encounter  Procedures   [place] LIMITED JOINT SPACE STRUCTURES UP LEFT(NO LINKED CHARGES)    Reason for Exam (SYMPTOM  OR DIAGNOSIS REQUIRED):   left shoulder pain    Preferred imaging location?:   [facility]   Meds ordered this encounter   Medications   Triamcinolone Acetonide (ZILRETTA) intra-articular injection 32 mg    SAMPLE     Discussed warning signs or symptoms. Please see discharge instructions. Patient expresses understanding.   The above documentation has been reviewed and is accurate and complete [name], M.D.

## 2023-06-22 ENCOUNTER — Other Ambulatory Visit (HOSPITAL_COMMUNITY): Payer: Self-pay
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
## 2023-06-26 ENCOUNTER — Ambulatory Visit: Attending: Orthopedic Surgery | Admitting: Rehabilitative and Restorative Service Providers"

## 2023-06-26 ENCOUNTER — Other Ambulatory Visit: Payer: Self-pay

## 2023-06-26 ENCOUNTER — Encounter: Payer: Self-pay | Admitting: Rehabilitative and Restorative Service Providers"

## 2023-06-26 DIAGNOSIS — M25512 Pain in left shoulder: Secondary | ICD-10-CM | POA: Diagnosis present

## 2023-06-26 DIAGNOSIS — M6281 Muscle weakness (generalized): Secondary | ICD-10-CM | POA: Insufficient documentation

## 2023-06-26 DIAGNOSIS — R29898 Other symptoms and signs involving the musculoskeletal system: Secondary | ICD-10-CM | POA: Insufficient documentation

## 2023-06-26 DIAGNOSIS — R293 Abnormal posture: Secondary | ICD-10-CM | POA: Diagnosis present

## 2023-06-29 ENCOUNTER — Ambulatory Visit: Admitting: Rehabilitative and Restorative Service Providers"

## 2023-06-29 NOTE — Telephone Encounter (Signed)
 Ran for durolane

## 2023-06-30 ENCOUNTER — Telehealth: Payer: Self-pay

## 2023-06-30 NOTE — Telephone Encounter (Signed)
 Can you please call and schedule patient when the medication is stocked. Thank you   Durolane authorized for left knee Plan covers 80% of the allowable amount for Durolane Plan covers 100% of the procedures 20611/20610 Deductible does not apply Patient has a $20 copay whether or not an office visit it billed or not Only one copay per DOS Once the OOP has ben met coverage goes to 100% and copay will no longer apply No pre cert's or referrals needed Medical notes are required with the billing claim  Reference number [id number] EXP: 12/29/23

## 2023-07-03 ENCOUNTER — Encounter: Payer: Self-pay | Admitting: Rehabilitative and Restorative Service Providers"

## 2023-07-03 ENCOUNTER — Ambulatory Visit: Admitting: Rehabilitative and Restorative Service Providers"

## 2023-07-03 DIAGNOSIS — R29898 Other symptoms and signs involving the musculoskeletal system: Secondary | ICD-10-CM

## 2023-07-03 DIAGNOSIS — M25512 Pain in left shoulder: Secondary | ICD-10-CM

## 2023-07-03 DIAGNOSIS — M6281 Muscle weakness (generalized): Secondary | ICD-10-CM

## 2023-07-03 DIAGNOSIS — R293 Abnormal posture: Secondary | ICD-10-CM

## 2023-07-03 NOTE — ED Provider Notes (Signed)
 ------------------------------------------------------------------------------- Attestation signed by Nathan Duncan, MD at 07/07/2023 10:06 AM ATTENDING SUPERVISORY SHARED SERVICE NOTE  I supervised the care provided by the APP. We have discussed the patient's case. I have reviewed the note and agree with the plan of treatment.  I personally seen and examined the patient, providing direct face to face care.    I performed a substantive portion of the history, physical exam, MDM for the patient encounter, as documented by the APP.    I have personally viewed the imaging studies performed. Patient's presentation is most consistent with acute presentation with potential threat to life or bodily function  I was present for the following procedures: None  Time Spent in Critical Care of the patient: None    -------------------------------------------------------------------------------  Patient placed in First Look pathway, seen and evaluated for chief complaint of shortness of breath worsening over the last few days.  History of DVTs and PEs.  Feels similar however currently on Xarelto .  No missed dosages. Hx of stents.   Pertinent exam findings include non-toxic in appearance and speech is clear. Based on initial evaluation, labs are currently indicated and radiology studies are currently indicated as allowed for current processes and treatments as applicable in a triage setting and could be different than if patient were seen in a main treatment area or dependent on labs/imagining after results are displayed.  Patient counseled on process, plan, and necessity for staying for completing the evaluation.   This document serves as a record of services personally performed by Nathan Rodriguez  Emergency Department Provider Note  Provider at bedside: 07/03/2023 11:50 AM  History obtained from the: patient  History   Chief Complaint  Patient presents with  . Shortness of Breath  . Fatigue    HPI  Pt is a 78 y.o. male with history of CAD, PE, DVT, and factor V Leiden who presents with complaints of shortness of breath and fatigue, onset about a week ago.  Patient reports that his symptoms are significantly worsened with exertion.  He states that he has recently been training for a bike race next month but has been unable to perform any exercise due to his symptoms.  Patient states that the smallest tasks use all his energy.  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  He states that this feels similar to his previous PE.  He does take blood thinners and has been compliant.  He has not missed any doses.  Patient denies any recent leg swelling.  He denies any associated fever or chills.  He denies chest pain, abdominal pain, nausea, or vomiting.  He denies any other complaints at this time.  No LMP for male patient.  ROS: Pertinent positives and negatives per HPI.  Past Medical History Past Medical History:  Diagnosis Date  . Acute deep vein thrombosis (DVT) of left lower extremity    (CMD) 10/09/2015  . Bilateral pulmonary embolism    (CMD) 10/09/2015   with right heart strain  . Factor V deficiency    (CMD)   . Hypercholesterolemia   . Memory loss    Past Surgical History Past Surgical History:  Procedure Laterality Date  . APPENDECTOMY     Procedure: APPENDECTOMY  . LADD PROCEDURE     Procedure: LADD PROCEDURE  . SHOULDER SURGERY     Procedure: SHOULDER SURGERY  . WISDOM TOOTH EXTRACTION     Procedure: WISDOM TOOTH EXTRACTION   Medications These were reviewed. See nursing note for details.  Allergies Patient has no known allergies.  Family History Family History  Problem Relation Name Age of Onset  . Breast cancer Mother    . Alzheimer's disease Mother    . Heart failure Father    . Factor V Leiden deficiency Father    . Diabetes Father    . Factor V Leiden deficiency Brother     Social History Social History   Tobacco Use  . Smoking status: Former   . Smokeless tobacco: Never  Substance Use Topics  . Alcohol use: Yes  . Drug use: No   All pertinent family history, social history and PMHx including medical, surgical, current medications and allergies were reviewed by myself. See nursing note for details.   Physical Exam  I have reviewed the following vital signs: BP 137/90   Pulse 68   Temp 97.6 F (36.4 C) (Oral)   Resp 12   Ht 177.8 cm (5' 10)   Wt 73.6 kg (162 lb 3.2 oz)   SpO2 98%   BMI 23.27 kg/m   Physical Exam Vitals and nursing note reviewed.  Constitutional:      General: He is not in acute distress.    Appearance: Normal appearance. He is well-developed. He is not ill-appearing.  HENT:     Head: Normocephalic and atraumatic.     Mouth/Throat:     Mouth: Mucous membranes are moist.  Cardiovascular:     Rate and Rhythm: Normal rate and regular rhythm.     Pulses:          Radial pulses are 2+ on the right side and 2+ on the left side.     Heart sounds: Normal heart sounds.  Pulmonary:     Effort: Pulmonary effort is normal. No respiratory distress.     Breath sounds: Normal breath sounds. No wheezing, rhonchi or rales.  Abdominal:     Palpations: Abdomen is soft.     Tenderness: There is no abdominal tenderness.  Musculoskeletal:     Cervical back: Normal range of motion and neck supple.     Right lower leg: No edema.     Left lower leg: No edema.  Skin:    General: Skin is warm and dry.  Neurological:     Mental Status: He is alert.  Psychiatric:        Mood and Affect: Mood normal.        Behavior: Behavior normal.    Results  LABS Lab Results (last 24 hours)     Procedure Component Value Ref Range Date/Time   Urinalysis with Reflex to Microscopic [[id number]]  (Normal) Collected: 07/03/23 1628   Lab Status: Final result Specimen: Urine from Clean Catch Updated: 07/03/23 1644    Color, Urine Yellow Yellow     Clarity, Urine Clear Clear     Specific Gravity, Urine 1.023 1.005 - 1.025      pH, Urine 7.5 5.0 - 8.0     Protein, Urine Negative Negative, 10 , 20  mg/dL     Glucose, Urine Negative Negative, 30 , 50  mg/dL     Ketones, Urine Negative Negative, Trace mg/dL     Bilirubin, Urine Negative Negative     Blood, Urine Negative Negative, Trace     Nitrite, Urine Negative Negative     Leukocyte Esterase, Urine Negative Negative, 25     Urobilinogen, Urine Normal <2.0 mg/dL    Narrative:     Microscopic not indicated   Troponin, High Sensitive (2 Hr  Rfx) [[id number]]  (Normal) Collected: 07/03/23 1312   Lab Status: Final result Specimen: Blood from Venous Updated: 07/03/23 1400    Troponin, High Sensitive 5 <20 ng/L     Comment: >= 20 ng/L INDICATES MYOCARDIAL DAMAGE.THE DIAGNOSIS OF MYOCARDIAL INFARCTION REQUIRES CLINICAL CORRELATION.   Elevated troponin may also be due to myocardial stress from a variety of causes.   Alkaline Phos (ALP) levels >400 U/L may cause falsely elevated results. Troponin test is invalid in patients taking asfotase alpha.   This troponin assay was not validated for evaluation of troponin in patients younger than 21 years. There are no ranges established for patients younger than 21 years. Therefore, laboratory results for these patients should be  interpreted with caution.      SARS-Cov-2, Flu, and RSV, Qualitative NAAT [[id number]]  (Normal) Collected: 07/03/23 1214   Lab Status: Final result Specimen: Swab from Nasopharynx Updated: 07/03/23 1301    SARS-CoV-2 Negative Negative     Influenza A Negative Negative     Influenza B Negative Negative     RSV Negative Negative    Narrative:     Results are for use in the simultaneous rapid in vitro detection and differentiation of SARS-CoV-2, RSV, influenza A virus, and influenza B virus nucleic acids by PCR in clinical specimens.   Positive results are indicative of active infection but do not rule out bacterial infection or co-infection with other pathogens not detected by the test. Clinical  correlation with patient history and other diagnostic information is necessary to determine patient infection status. The agent detected may not be the definite cause of disease.   Negative results do not preclude SARS-CoV-2, RSV, influenza A, and/or influenza B infection and should not be used as the sole basis for diagnosis, treatment or other patient management decisions. Negative results must be combined with clinical observations, patient history, and/or epidemiological information.   Test performed by [facility] qualified personnel using the Cepheid Xpert SARS-CoV-2 & Influenza A/B Nucleic acid test on the GeneXpert instrument. This test is only for use under the Food and Drug Administration's Emergency Use Authorization (EUA).   CBC with Differential [[id number]]  (Abnormal) Collected: 07/03/23 1109   Lab Status: Final result Specimen: Blood from Venous Updated: 07/03/23 1114   Narrative:     The following orders were created for panel order CBC with Differential. Procedure                               Abnormality         Status                    ---------                               -----------         ------                    CBC with Differential[[id number]]        Abnormal            Final result               Please view results for these tests on the individual orders.   Comprehensive Metabolic Panel [[id number]]  (Abnormal) Collected: 07/03/23 1109   Lab Status: Final result Specimen: Blood from Venous Updated: 07/03/23 1144    Sodium 139  136 - 145 mmol/L     Potassium 4.2 3.4 - 4.5 mmol/L     Chloride 104 98 - 107 mmol/L     CO2 30 21 - 31 mmol/L     Anion Gap 5* 6 - 14 mmol/L     Glucose, Random 90 70 - 99 mg/dL     Blood Urea Nitrogen (BUN) 21 7 - 25 mg/dL     Creatinine 9.02 9.29 - 1.30 mg/dL     eGFR 79 >40 fO/fpw/8.26f7     Comment: GFR estimated by CKD-EPI equations(NKF 2021).   Recommend confirmation of Cr-based eGFR by using Cys-based eGFR and other filtration markers  (if applicable) in complex cases and clinical decision-making, as needed.      Albumin 4.3 3.5 - 5.7 g/dL     Total Protein 7.0 6.4 - 8.9 g/dL     Bilirubin, Total 0.5 0.3 - 1.0 mg/dL     Alkaline Phosphatase (ALP) 69 34 - 104 U/L     Aspartate Aminotransferase (AST) 26 13 - 39 U/L     Alanine Aminotransferase (ALT) 28 7 - 52 U/L     Calcium  10.1 8.6 - 10.3 mg/dL     BUN/Creatinine Ratio --    Comment: Creatinine is normal, ratio is not clinically indicated.      Troponin, High Sensitive (0 Hr + 2 Hr Rfx) [[id number]]  (Normal) Collected: 07/03/23 1109   Lab Status: Final result Specimen: Blood from Venous Updated: 07/03/23 1154    Troponin, High Sensitive 6 <20 ng/L     Comment: >= 20 ng/L INDICATES MYOCARDIAL DAMAGE.THE DIAGNOSIS OF MYOCARDIAL INFARCTION REQUIRES CLINICAL CORRELATION.   Elevated troponin may also be due to myocardial stress from a variety of causes.   Alkaline Phos (ALP) levels >400 U/L may cause falsely elevated results. Troponin test is invalid in patients taking asfotase alpha.   This troponin assay was not validated for evaluation of troponin in patients younger than 21 years. There are no ranges established for patients younger than 21 years. Therefore, laboratory results for these patients should be  interpreted with caution.      CBC with Differential [[id number]]  (Abnormal) Collected: 07/03/23 1109   Lab Status: Final result Specimen: Blood from Venous Updated: 07/03/23 1114    WBC 5.55 4.40 - 11.00 10*3/uL     RBC 5.11 4.50 - 5.90 10*6/uL     Hemoglobin 16.9 14.0 - 17.5 g/dL     Hematocrit 50.6 58.4 - 50.4 %     Mean Corpuscular Volume (MCV) 96.5* 80.0 - 96.0 fL     Mean Corpuscular Hemoglobin (MCH) 33.1 27.5 - 33.2 pg     Mean Corpuscular Hemoglobin Conc (MCHC) 34.3 33.0 - 37.0 g/dL     Red Cell Distribution Width (RDW) 14.0 12.3 - 17.0 %     Platelet Count (PLT) 274 150 - 450 10*3/uL     Mean Platelet Volume (MPV) 6.6* 6.8 - 10.2 fL     Neutrophils %  67 %     Lymphocytes % 19 %     Monocytes % 12 %     Eosinophils % 1 %     Basophils % 0 %     Neutrophils Absolute 3.70 1.80 - 7.80 10*3/uL     Lymphocytes # 1.10 1.00 - 4.80 10*3/uL     Monocytes # 0.70 0.00 - 0.80 10*3/uL     Eosinophils # 0.10 0.00 - 0.50 10*3/uL     Basophils # 0.00 0.00 -  0.20 10*3/uL    B-Type Natriuretic Peptide (BNP) [[id number]]  (Normal) Collected: 07/03/23 1109   Lab Status: Final result Specimen: Blood from Venous Updated: 07/03/23 1249    B-Type Natriuretic Peptide (BNP) 20 <100 pg/mL       Labs reviewed by myself and considered in medical decision making. Please see below for more detail.  RADIOLOGY Radiology Results (last 72 hours)     Procedure Component Value Units Date/Time   CT Angio Chest Pulmonary Embolism [[id number]] Collected: 07/03/23 1602   Order Status: Completed Updated: 07/03/23 1617   Narrative:     CLINICAL DATA:  Several day history of shortness of breath and fatigue  EXAM: CT ANGIOGRAPHY CHEST WITH CONTRAST  TECHNIQUE: Multidetector CT imaging of the chest was performed using the standard protocol during bolus administration of intravenous contrast. Multiplanar CT image reconstructions and MIPs were obtained to evaluate the vascular anatomy.  RADIATION DOSE REDUCTION: This exam was performed according to the departmental dose-optimization program which includes automated exposure control, adjustment of the mA and/or kV according to patient size and/or use of iterative reconstruction technique.  CONTRAST:  80 mL iohexoL  (OMNIPAQUE ) 350 mg iodine/mL injection (MDV) 80 mL  COMPARISON:  Same day chest radiograph, CTA chest dated 07/08/2021  FINDINGS: Cardiovascular: The study is high quality for the evaluation of pulmonary embolism. There are no filling defects in the central, lobar, segmental or subsegmental pulmonary artery branches to suggest acute pulmonary embolism. Ascending thoracic aorta measures 4.3 x 4.2 cm.  Left atrial enlargement. Elevation of the bilateral hemidiaphragms with mild mass effect of the medial left hemidiaphragm along the lateral wall of the left ventricle. No significant pericardial fluid/thickening. Coronary artery calcifications and aortic atherosclerosis.  Mediastinum/Nodes: Imaged thyroid gland without nodules meeting criteria for imaging follow-up by size. Normal esophagus. No pathologically enlarged axillary, supraclavicular, mediastinal, or hilar lymph nodes.  Lungs/Pleura: The central airways are patent. Diffuse mosaic attenuation and mild interlobular septal thickening. Unchanged superior segment right lower lobe calcified granuloma (6:40). No pneumothorax. No pleural effusion.  Upper abdomen: Colonic diverticulosis without acute diverticulitis.  Musculoskeletal: No acute or abnormal lytic or blastic osseous lesions. Multilevel degenerative changes of the thoracic spine. Partially imaged left shoulder arthroplasty. Degenerative changes of the left shoulder.  Review of the MIP images confirms the above findings.    Impression:     1. No evidence of pulmonary embolism. 2. Diffuse mosaic attenuation and mild interlobular septal thickening, which can be seen in the setting of pulmonary edema. 3. Left atrial enlargement. 4. Ascending thoracic aortic aneurysm measures 4.3 cm, slightly increased from 4.1 cm. Recommend annual imaging followup by CTA or MRA. This recommendation follows 2010 ACCF/AHA/AATS/ACR/ASA/SCA/SCAI/SIR/STS/SVM Guidelines for the Diagnosis and Management of Patients with Thoracic Aortic Disease. Circulation. 2010; 121: Z733-z630. Aortic aneurysm NOS (ICD10-I71.9) 5. Aortic Atherosclerosis (ICD10-I70.0). Coronary artery calcifications. Assessment for potential risk factor modification, dietary therapy or pharmacologic therapy may be warranted, if clinically indicated.   Electronically Signed   By: Limin  Xu M.D.   On: 07/03/2023 16:14     XR Chest 2 Views [[id number]] Collected: 07/03/23 1355   Order Status: Completed Updated: 07/03/23 1359   Narrative:     CLINICAL DATA:  Shortness of breath.  EXAM: CHEST - 2 VIEW  COMPARISON:  Chest x-ray 07/13/2021 and older  FINDINGS: Underinflation. No consolidation, pneumothorax or effusion. No edema. Tortuous ectatic aorta. Overlapping cardiac leads. Degenerative changes along the spine. Right shoulder arthroplasty seen at the edge of the imaging field. Degenerative changes of left  shoulder.    Impression:     Underinflation.  No acute cardiopulmonary disease.   Electronically Signed   By: Ranell Bring M.D.   On: 07/03/2023 13:56       Imaging reviewed by myself and considered in medical decision making. Imaging final read interpreted by radiology. Please see below for more detail.  EKG Ventricular Rate                   63        BPM                  Atrial Rate                        63        BPM                  P-R Interval                       180       ms                   QRS Duration                       136       ms                   Q-T Interval                       404       ms                   QTC Calculation Bazett             413       ms                   Calculated P Axis                  45        degrees              Calculated R Axis                  51        degrees              Calculated T Axis                  19        degrees               Sinus rhythm  Nonspecific intraventricular block  When compared with ECG of 13-Jul-2021 10 28,  Nonspecific intraventricular block has replaced Right bundle branch block  Confirmed by Nathan Rodriguez  [phone]  on 07-03-2023 11 15 49 AM   Medical Decision Making  ED Course ED Course as of 07/04/23 0150  Mon Jul 03, 2023  1559 [facility] Radiology regarding CTA.  [SB]  1620 Patient had a NM PET CT cardiac perfusion study on 03/23/2023 showing EF of 50% at rest and 59% at stress.  [SB]  1635  Echo done 07/14/21 showing EF of 55%.  [SB]  1659 Case discussed with Dr. Augustin, ED attending.  [SB]  1700 Discussed all findings with the patient who  verbalized understanding. [SB]    ED Course User Index [SB] Nathan Anette Hope, PA-C   Initial Differential Diagnoses: The following diagnoses were considered, but not limited to, in determining workup and treatments: MI, CHF, COPD, pneumonia, PE, URI, arrhythmia, electrolyte imbalance, dehydration  Labs/Imaging/medications/other testing ordered due to concerns discussed in history of and physical exam include: CBC, CMP, troponin, BNP, UA, COVID/flu/RSV swab, EKG, chest x-ray, CTA chest  Therapies: These medications and interventions were provided for the patient while in the ED. Medications  iohexoL  (OMNIPAQUE ) 350 mg iodine/mL injection (MDV) 80 mL (80 mL intravenous Given 07/03/23 1332)   Parenteral narcotics ordered: No  Drug Therapy requiring intensive monitoring ordered: No  Testing Results: Labs significant for: WBC not consistent with severe infection, HGB does not require emergent intervention, and platelets stable and do not require intervention and no severe electrolyte derangements, no AKI, and bilirubin and lfts within normal limits and troponin within normal limits, less concern for ACS at this time, UA not consistent with infection, and BNP within normal limits, less concern for CHF .  COVID, flu, RSV negative.  Imaging Results: My interpretation of CXR shows no PTX, large PNA, or pleural effusion. Final result pending radiology read.  I dicussed imaging finding with provider: No.   I reviewed medical record from Subspecialty consultation on 06/29/23 which showed patient seen by general surgery for diastasis recti.   The following consultations were required: none  Clinical Complexity  Patient's presentation is most consistent with acute presentation with potential threat to life or bodily function.  Decision to admit  or increased level of care requiring transfer: While I initially considered hospital admission, based on reassuring ED work-up and response to treatment, I feel that patient is appropriate for discharge home with follow-up.  Strict return precautions regarding this ED visit were discussed.  OTC medications advised and discussed with patient include none.  Prescription medications discussed: antibiotics  Patient's lack of access to primary care increases the complexity of managing their presentation and will complicate their treatment plans. I have addressed this by attempted to explain medical condition in layman's terms and have provided discharge papers for their review.   Pt presentation is complicated by their history of DVT/PE and factor V Leiden, resulting in increased risk of clotting.  Discussed options for workup with patient including risks and benefits via shared decision making and decided to proceed with workup.  ED Assessment/Plan and Clinical Impression  Pt is a 78 y.o. male who presented to the ED with complaints of shortness of breath and fatigue. Remainder of history and physical exam are as detailed above. Patient was afebrile with stable vital signs.  Lab work, EKG, and imaging studies were ordered for further evaluation.  CBC and CMP were unremarkable.  Troponin x 2 were negative making ACS less likely.  BNP was normal making CHF less likely.  UA showed no signs of infection or dehydration.  Patient tested negative for COVID, flu, and RSV.  EKG showed no signs of STEMI or acute ischemia.  Chest x-ray showed no evidence of pneumonia, pneumothorax, pleural effusion, or other acute finding.  Due to patient's history of clotting, CTA was done for further evaluation of possible PE.  CTA showed no evidence of PE but did show concerns for septal thickening and possible pulmonary edema.  Based on all findings today, I have low suspicion for ACS, CHF, PE, electrolyte abnormality, or other  emergent etiology of his symptoms.  Shared decision-making was utilized with the  patient and he is comfortable being discharged home at this time.  Given the finding of possible septal thickening with pulmonary edema, my ED attending Dr. Augustin did recommend placing the patient on antibiotics.  Therefore, patient will be started on course of Augmentin.  Patient was otherwise instructed on symptomatic management.  Unfortunately, the exact cause of his symptoms is not certain today but does not appear to be due to a life-threatening etiology.  He will follow-up with his primary care provider for further evaluation and management.  He was given very strict ED return precautions and verbalized understanding.  All questions were answered to his satisfaction.  Patient was stable at the time of discharge.  Clinical Impression 1. Dyspnea on exertion   2. Acute pulmonary edema    (CMD)      Medication List     START taking these medications    amoxicillin-pot clavulanate 875-125 mg per tablet Commonly known as: AUGMENTIN Take 1 tablet by mouth 2 (two) times a day for 10 days.       ASK your doctor about these medications    acetaminophen  325 mg tablet Commonly known as: TYLENOL  Take 650-975 mg by mouth every 6 (six) hours as needed.   albuterol HFA 90 mcg/actuation inhaler Commonly known as: PROVENTIL HFA;VENTOLIN HFA;PROAIR HFA Inhale 2 puffs every 6 (six) hours as needed for wheezing for up to 30 days.   aluminum  hydroxide-magnesium  carbonate 95-358 mg/15 mL Susp oral suspension Commonly known as: GAVISCON Take 15 mL by mouth.   Anucort-HC  25 mg suppository Generic drug: hydrocortisone  INSERT 1 SUPPOSITORY RECTALLY AT BEDTIME   DOCOSAHEXAENOIC ACID ORAL Take 2 g by mouth.   docusate sodium  100 mg capsule Commonly known as: COLACE Take 100 mg by mouth.   ezetimibe  10 mg tablet Commonly known as: ZETIA  Take 10 mg by mouth daily.   hydrocortisone  valerate 0.2 % cream Commonly  known as: WESTCORT  Apply topically.   magnesium  250 mg tablet Take 1 tablet by mouth Once Daily.   metoprolol  tartrate 25 mg tablet Commonly known as: LOPRESSOR  Take 1 tablet by mouth daily.   omega-3 fatty acids-fish oil 1,000 mg Cap capsule Commonly known as: FISH OIL Take 1 capsule by mouth Once Daily.   omeprazole 20 mg DR capsule Commonly known as: PriLOSEC Take 20 mg by mouth Once Daily.   * Repatha  SureClick 140 mg/mL Pnij Generic drug: evolocumab  INJECT 1 PEN UNDER THE SKIN EVERY 14 DAYS   * evolocumab  140 mg/mL Pnij Inject under the skin.   rOPINIRole  0.25 mg tablet Commonly known as: REQUIP  Take 0.25 mg by mouth.   tadalafiL 5 mg tablet Commonly known as: CIALIS Take 1 tablet by mouth daily.   Thera-M 27-0.4 mg Tab Generic drug: multivitamin-iron-minerals-folic acid Take 1 tablet by mouth Once Daily.   WHEAT DEXTRIN ORAL Take 1 packet by mouth.   Xarelto  20 mg tablet Generic drug: rivaroxaban  Take 20 mg by mouth.      * * This list has 2 medication(s) that are the same as other medications prescribed for you. Read the directions carefully, and ask your doctor or other care provider to review them with you.            Where to Get Your Medications     These medications were sent to [facility] [address] [place], [place] - [address] - PHONE: [phone] - FAX: [phone]  [address] [place] [address]    Phone: [phone]  amoxicillin-pot clavulanate 875-125 mg per tablet    FOLLOW UP Arnulfo Jenna Sharps, MD [address] [place] [address] [phone]  Schedule an appointment as soon as possible for a visit    [facility] -  EMERGENCY DEPARTMENT [address] [place]  [address] [phone]  As needed, If symptoms worsen   *Some images could not be shown.
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
## 2023-07-04 ENCOUNTER — Telehealth: Payer: Self-pay | Admitting: Family Medicine

## 2023-07-04 NOTE — Telephone Encounter (Signed)
 Spoke to patient in regards to his left shoulder. He said that the pain is about 50% better but he is more concerned about the loss of strength, certain ranges of movement and dexterity with that arm.  He has gone back to physical therapy to try to work on strengthening it but there are certain movements that he is not even able to do without a resistance band. He also mentioned having trouble using his arm when writing.  Please advise.

## 2023-07-04 NOTE — Telephone Encounter (Signed)
 Spoke to patient. His knee is feeling okay right now. He will call back to schedule when needed.

## 2023-07-05 ENCOUNTER — Encounter: Admitting: Rehabilitative and Restorative Service Providers"

## 2023-07-06 ENCOUNTER — Encounter: Admitting: Rehabilitative and Restorative Service Providers"

## 2023-07-07 ENCOUNTER — Encounter: Payer: Self-pay | Admitting: Cardiology

## 2023-07-10 ENCOUNTER — Ambulatory Visit: Attending: Cardiovascular Disease | Admitting: Cardiovascular Disease

## 2023-07-10 ENCOUNTER — Other Ambulatory Visit: Payer: Self-pay | Admitting: Cardiovascular Disease

## 2023-07-10 ENCOUNTER — Ambulatory Visit: Admitting: Family Medicine

## 2023-07-10 ENCOUNTER — Encounter: Payer: Self-pay | Admitting: Family Medicine

## 2023-07-10 ENCOUNTER — Encounter: Payer: Self-pay | Admitting: Cardiovascular Disease

## 2023-07-10 ENCOUNTER — Other Ambulatory Visit: Payer: Self-pay | Admitting: Pharmacist Clinician (PhC)/ Clinical Pharmacy Specialist

## 2023-07-10 VITALS — BP 132/88 | HR 75 | Ht 70.0 in | Wt 162.2 lb

## 2023-07-10 VITALS — BP 126/86 | HR 73 | Ht 70.0 in | Wt 163.0 lb

## 2023-07-10 DIAGNOSIS — M1712 Unilateral primary osteoarthritis, left knee: Secondary | ICD-10-CM

## 2023-07-10 DIAGNOSIS — I10 Essential (primary) hypertension: Secondary | ICD-10-CM | POA: Diagnosis not present

## 2023-07-10 DIAGNOSIS — R0609 Other forms of dyspnea: Secondary | ICD-10-CM

## 2023-07-10 DIAGNOSIS — E782 Mixed hyperlipidemia: Secondary | ICD-10-CM | POA: Diagnosis not present

## 2023-07-10 DIAGNOSIS — R000 Tachycardia, unspecified: Secondary | ICD-10-CM

## 2023-07-10 DIAGNOSIS — M25562 Pain in left knee: Secondary | ICD-10-CM

## 2023-07-10 DIAGNOSIS — M25512 Pain in left shoulder: Secondary | ICD-10-CM

## 2023-07-10 DIAGNOSIS — I82599 Chronic embolism and thrombosis of other specified deep vein of unspecified lower extremity: Secondary | ICD-10-CM | POA: Diagnosis not present

## 2023-07-10 DIAGNOSIS — I2510 Atherosclerotic heart disease of native coronary artery without angina pectoris: Secondary | ICD-10-CM | POA: Diagnosis not present

## 2023-07-10 DIAGNOSIS — G8929 Other chronic pain: Secondary | ICD-10-CM

## 2023-07-10 DIAGNOSIS — I7121 Aneurysm of the ascending aorta, without rupture: Secondary | ICD-10-CM

## 2023-07-10 LAB — CBC WITH DIFFERENTIAL/PLATELET

## 2023-07-10 MED ORDER — ENOXAPARIN SODIUM 120 MG/0.8ML IJ SOSY: PREFILLED_SYRINGE | INTRAMUSCULAR | 0 refills | Status: DC

## 2023-07-10 NOTE — Assessment & Plan Note (Signed)
 History of thoracic aortic aneurysm measuring 43 mm by recent CTA performed/14/25.  This will be repeated on an annual basis.

## 2023-07-10 NOTE — Patient Instructions (Signed)
 Medication Instructions:   Stop taking Xarelto  (Rivaroxaban ) on Monday, April 21.  Inject Lovenox  on Tuesday April 22 at same time you would take xarelto .   *If you need a refill on your cardiac medications before your next appointment, please call your pharmacy*  Lab Work: Your physician recommends that you have labs drawn today: BMET & CBC   If you have labs (blood work) drawn today and your tests are completely normal, you will receive your results only by: MyChart Message (if you have MyChart) OR A paper copy in the mail If you have any lab test that is abnormal or we need to change your treatment, we will call you to review the results.  Testing/Procedures: See below  Follow-Up: At [facility], you and your health needs are our priority.  As part of our continuing mission to provide you with exceptional heart care, our providers are all part of one team.  This team includes your primary Cardiologist (physician) and Advanced Practice Providers or APPs (Physician Assistants and Nurse Practitioners) who all work together to provide you with the care you need, when you need it.  Your next appointment:   2-3 week(s) after heart cath (4/24)  Provider:   Lauro Portal, MD     We recommend signing up for the patient portal called "MyChart".  Sign up information is provided on this After Visit Summary.  MyChart is used to connect with patients for Virtual Visits (Telemedicine).  Patients are able to view lab/test results, encounter notes, upcoming appointments, etc.  Non-urgent messages can be sent to your provider as well.   To learn more about what you can do with MyChart, go to [email or web address].   Other Instructions       Cardiac/Peripheral Catheterization   You are scheduled for a Cardiac Catheterization on Thursday, April 24 with Dr. Antionette Kirks.  1. Please arrive at the [facility] (Main Entrance A) at [facility]: [address]  [place], [place] [address] at 7:00 AM (This time is 2 hour(s) before your procedure to ensure your preparation).   Free valet parking service is available. You will check in at ADMITTING. The support person will be asked to wait in the waiting room.  It is OK to have someone drop you off and come back when you are ready to be discharged.        Special note: Every effort is made to have your procedure done on time. Please understand that emergencies sometimes delay scheduled procedures.  2. Diet: Do not eat solid foods after midnight.  You may have clear liquids until 5 AM the day of the procedure.  3. Labs: You will need to have blood drawn today (4/21).   4. Medication instructions in preparation for your procedure:   Stop taking Xarelto  (Rivaroxaban ) on Monday, April 21.  Inject Lovenox  on Tuesday April 22 at same time you would take xarelto .   On the morning of your procedure, take Aspirin  81 mg and any morning medicines NOT listed above.  You may use sips of water .  5. Plan to go home the same day, you will only stay overnight if medically necessary. 6. You MUST have a responsible adult to drive you home. 7. An adult MUST be with you the first 24 hours after you arrive home. 8. Bring a current list of your medications, and the last time and date medication taken. 9. Bring ID and current insurance cards. 10.Please wear clothes that are easy to  get on and off and wear slip-on shoes.  Thank you for allowing us  to care for you!   -- [facility]        1st Floor: - Lobby - Registration  - Pharmacy  - Lab - Cafe  2nd Floor: - PV Lab - Diagnostic Testing (echo, CT, nuclear med)  3rd Floor: - Vacant  4th Floor: - TCTS (cardiothoracic surgery) - AFib Clinic - Structural Heart Clinic - Vascular Surgery  - Vascular Ultrasound  5th Floor: - HeartCare Cardiology (general and EP) - Clinical Pharmacy for coumadin, hypertension, lipid,  weight-loss medications, and med management appointments    Valet parking services will be available as well.

## 2023-07-10 NOTE — Assessment & Plan Note (Signed)
 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

## 2023-07-10 NOTE — Progress Notes (Signed)
 07/10/2023 Nathan Rodriguez   06/07/1945  [id number]  Primary Physician Josepha Nickels, MD Primary Cardiologist: Avanell Leigh MD Bennye Bravo, [place]  HPI:  Nathan Rodriguez is a 78 y.o.   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 He was referred by Laray Platt [facility] for cardiovascular evaluation because of progressive dyspnea .  I last saw him in the office 03/29/2023.[name]  He has a history of CAD status post rotational atherectomy, PCI and drug eluting stenting 2 by Dr. Terrace Ferrara 11/03/2010 of his LAD. 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 This resected include hyperlipidemia not on statin therapy, history of tobacco abuse smoking 5-6 cigarettes a day off and on for the last 40 years.There is a family history of a father who had bypass surgery at age 13.   I performed left heart cath on him 08/31/2015 revealing a widely patent stent, no significant CAD with normal LV function.  Right after that he was diagnosed with a DVT and PE was placed on Xarelto .  His symptoms of dyspnea resolved.  He is being sent back now by Laray Platt PA-C because of elevated heart rate with exercise and some dyspnea.   I obtained a 2D echocardiogram on him 03/23/2018 which was essentially normal and a Myoview  stress test that showed diaphragmatic attenuation.  He did see Dr. Lawana Pray in the office for tachycardia and an event monitor showed short runs of nonsustained ventricular tachycardia and atrial tachycardia.  He was prescribed metoprolol  twice daily.  His tachycardic episodes have markedly improved.  He is no longer short of breath and is able to exercise without limitation.  He denies chest pain.  Our pharmacist Raquel is also working with him to initiate Repatha  therapy for his hyperlipidemia and statin intolerance.   Since I saw him in the office 4  months ago [facility] with acute onset of shortness of breath while doing physical therapy.  His workup was unrevealing including a low BNP of 20, negative troponins.  Chest CT showed no evidence of pulmonary emboli although there were changes suggestive of congestive heart failure/pulmonary edema.  He did have an episode of chest pain with radiation to his throat and jaw yesterday after playing golf.  I performed cardiac PET study on him 03/23/2023 which was low risk and nonischemic done to clear him for his competitive bicycling.  He also had a fairly normal cardiac catheterization at [facility] 06/24/21 after developing acute onset shortness of breath after stopping his Xarelto  for his right total knee replacement.  I favor coronary angiography with Lovenox  bridging.   Current Meds  Medication Sig   acetaminophen  (TYLENOL ) 500 MG tablet Take 500 mg by mouth every 6 (six) hours as needed.   aluminum  hydroxide-magnesium  carbonate (GAVISCON) 95-358 MG/15ML SUSP Take 15 mLs by mouth as needed for indigestion or heartburn.   amoxicillin-clavulanate (AUGMENTIN) 875-125 MG tablet Take 1 tablet by mouth 2 (two) times daily.   docusate sodium  (COLACE) 100 MG capsule Take 1 capsule (100 mg total) by mouth 2 (two) times daily.   Evolocumab  (REPATHA  SURECLICK) 140 MG/ML SOAJ INJECT 1 PEN UNDER THE SKIN EVERY 14 DAYS   ezetimibe  (ZETIA ) 10 MG tablet Take 1 tablet (10 mg total) by mouth daily.   hydrocortisone  valerate cream (WESTCORT )  0.2 % Apply 1 application topically 2 (two) times daily as needed (irritation).   metoprolol  tartrate (LOPRESSOR ) 25 MG tablet Take 1 tablet by mouth once daily   Multiple Vitamin (MULTIVITAMIN WITH MINERALS) TABS tablet Take 1 tablet by mouth daily.   Omega-3 1000 MG CAPS Take 1,000 mg by mouth daily.   omeprazole (PRILOSEC) 20 MG capsule Take 20 mg by mouth daily.   rivaroxaban  (XARELTO ) 20 MG TABS tablet TAKE 1 TABLET BY MOUTH ONCE DAILY  WITH SUPPER   rOPINIRole  (REQUIP ) 0.25 MG tablet Take 1 tablet (0.25 mg total) by mouth at bedtime.   Wheat Dextrin (BENEFIBER DRINK MIX PO) Take 1 packet by mouth in the morning and at bedtime.     No Known Allergies  Social History   Socioeconomic History   Marital status: Married    Spouse name: Not on file   Number of children: Not on file   Years of education: Not on file   Highest education level: Not on file  Occupational History   Not on file  Tobacco Use   Smoking status: Former    Current packs/day: 0.00    Average packs/day: 0.3 packs/day for 43.0 years (10.8 ttl pk-yrs)    Types: Cigarettes    Start date: 03/22/1959    Quit date: 03/21/2002    Years since quitting: 21.3   Smokeless tobacco: Never  Vaping Use   Vaping status: Never Used  Substance and Sexual Activity   Alcohol use: Yes    Comment: rare   Drug use: No   Sexual activity: Not Currently  Other Topics Concern   Not on file  Social History Narrative   Not on file   Social Drivers of Health   Financial Resource Strain: Not on file  Food Insecurity: Not on file  Transportation Needs: Not on file  Physical Activity: Not on file  Stress: Not on file  Social Connections: Unknown (08/01/2021)   Received from [facility], [facility]   Social Network    Social Network: Not on file  Intimate Partner Violence: Unknown (06/23/2021)   Received from [facility], [facility]   HITS    Physically Hurt: Not on file    Insult or Talk Down To: Not on file    Threaten Physical Harm: Not on file    Scream or Curse: Not on file     Review of Systems: General: negative for chills, fever, night sweats or weight changes.  Cardiovascular: negative for chest pain, dyspnea on exertion, edema, orthopnea, palpitations, paroxysmal nocturnal dyspnea or shortness of breath Dermatological: negative for rash Respiratory: negative for cough or wheezing Urologic: negative for hematuria Abdominal: negative for  nausea, vomiting, diarrhea, bright red blood per rectum, melena, or hematemesis Neurologic: negative for visual changes, syncope, or dizziness All other systems reviewed and are otherwise negative except as noted above.    Blood pressure 132/88, pulse 75, height 5\' 10"  (1.778 m), weight 162 lb 3.2 oz (73.6 kg), SpO2 100%.  General appearance: alert and no distress Neck: no adenopathy, no carotid bruit, no JVD, supple, symmetrical, trachea midline, and thyroid not enlarged, symmetric, no tenderness/mass/nodules Lungs: clear to auscultation bilaterally Heart: regular rate and rhythm, S1, S2 normal, no murmur, click, rub or gallop Extremities: extremities normal, atraumatic, no cyanosis or edema Pulses: 2+ and symmetric Skin: Skin color, texture, turgor normal. No rashes or lesions Neurologic: Grossly normal  EKG not performed today      ASSESSMENT AND PLAN:   Coronary artery  disease involving native coronary artery of native heart without angina pectoris History of CAD status post rotational atherectomy, PCI and drug-eluting stenting x 2 by Dr. Terrace Ferrara of his LAD 11/03/2010.  I cathed him 08/31/2015 revealing a widely patent stent with no significant CAD.  He had a low risk cardiac PET study performed 03/23/2023.  He was seen in the ER a week ago after developing sudden onset of shortness of breath during physical therapy.  Chest CT was negative for PE.  His troponins were negative and his BNP was low.  Yesterday he had an episode of chest pain rating to his throat and jaw after playing golf.  He has been on uninterrupted Xarelto .  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  I favor recatheterization with Lovenox  bridging.  Hyperlipidemia History of hyperlipidemia on Zetia  and Repatha  with lipid profile performed 05/10/2023 revealing total cholesterol 163, LDL of 88 and HDL 55.  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  Hypertension History of essential hypertension with blood pressure measured today at 132/88.  He is on metoprolol .  Tachycardia History of nonsustained ventricular tachycardia on event monitoring improved with addition of metoprolol .  He has seen Dr. Lawana Pray in the past.  Thoracic aortic aneurysm [facility]) History of thoracic aortic aneurysm measuring 43 mm by recent CTA performed/14/25.  This will be repeated on an annual basis.     Avanell Leigh MD FACP,FACC,FAHA, [facility] 07/10/2023 9:24 AM

## 2023-07-10 NOTE — Assessment & Plan Note (Signed)
 History of CAD status post rotational atherectomy, PCI and drug-eluting stenting x 2 by Dr. Terrace Ferrara of his LAD 11/03/2010.  I cathed him 08/31/2015 revealing a widely patent stent with no significant CAD.  He had a low risk cardiac PET study performed 03/23/2023.  He was seen in the ER a week ago after developing sudden onset of shortness of breath during physical therapy.  Chest CT was negative for PE.  His troponins were negative and his BNP was low.  Yesterday he had an episode of chest pain rating to his throat and jaw after playing golf.  He has been on uninterrupted Xarelto .  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  I favor recatheterization with Lovenox  bridging.

## 2023-07-10 NOTE — Telephone Encounter (Signed)
 Dr. Alease Hunter was out of the office last week. Pt has f/u appt scheduled for today.

## 2023-07-10 NOTE — Assessment & Plan Note (Signed)
 History of nonsustained ventricular tachycardia on event monitoring improved with addition of metoprolol .  He has seen Dr. Lawana Pray in the past.

## 2023-07-10 NOTE — Telephone Encounter (Signed)
 Bridge for heart cath on Thursday.  Hx of FVL, DVT/PE and AF

## 2023-07-10 NOTE — Patient Instructions (Addendum)
 Thank you for coming in today.   We can do the durolane injection whenever you would like.   I do think getting that shoulder ready for replacement may be needed.

## 2023-07-10 NOTE — Assessment & Plan Note (Signed)
 History of hyperlipidemia on Zetia  and Repatha  with lipid profile performed 05/10/2023 revealing total cholesterol 163, LDL of 88 and HDL 55.

## 2023-07-10 NOTE — Progress Notes (Signed)
   I, Nathan Rodriguez, CMA acting as a scribe for Nathan Juniper, MD.  Nathan Rodriguez is a 78 y.o. male who presents to [facility] today for cont'd L shoulder pain. Pt was last seen by [name] on 06/07/23 and was given a L GH joint Zilretta  injection  Today, pt reports less pain after shoulder injection but notes decreased mobility and dexterity in the shoulder. Some weakness in the arm. Knee sx have been pretty well managed.   Dx imaging: 06/16/2022 L shoulder MRI 12/27/21 L shoulder XR  Pertinent review of systems: No fevers or chills  Relevant historical information: History of a DVT/PE.  History of right shoulder and right knee replacement.   Exam:  BP 126/86   Pulse 73   Ht 5\' 10"  (1.778 m)   Wt 163 lb (73.9 kg)   SpO2 95%   BMI 23.39 kg/m  General: Well Developed, well nourished, and in no acute distress.   MSK: Left shoulder decreased range of motion.    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  Unfortunately there is nothing left for me to offer.  Conventional steroid injections have not lasted.  Even Zilretta  which is a very long-acting triamcinolone  injection has been only moderately helpful.  I think he is going to get forced into a total shoulder replacement.  He already has a good relationship with Dr. Deeann Fare.  I have advised that he contact Dr. Deeann Fare who proceed with surgical planning..  As for his chronic left knee pain the Durolane injection performed about 6 months ago is still working.  I do not see any reason to do it now.  He will contact me when the pain returns and we can proceed with that injection again.   PDMP not reviewed this encounter. No orders of the defined types were placed in this encounter.  No orders of the defined types were placed in this encounter.    Discussed warning signs or symptoms. Please see discharge instructions. Patient expresses understanding.   The above  documentation has been reviewed and is accurate and complete Nathan Rodriguez, M.D. Total encounter time 20 minutes including face-to-face time with the patient and, reviewing past medical record, and charting on the date of service.

## 2023-07-10 NOTE — Assessment & Plan Note (Signed)
 History of essential hypertension with blood pressure measured today at 132/88.  He is on metoprolol .

## 2023-07-10 NOTE — H&P (View-Only) (Signed)
 07/10/2023 Nathan Rodriguez   06/07/1945  [id number]  Primary Physician Josepha Nickels, MD Primary Cardiologist: Avanell Leigh MD Bennye Bravo, [place]  HPI:  Nathan Rodriguez is a 78 y.o.   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 He was referred by Laray Platt [facility] for cardiovascular evaluation because of progressive dyspnea .  I last saw him in the office 03/29/2023.Nathan Rodriguez  He has a history of CAD status post rotational atherectomy, PCI and drug eluting stenting 2 by Dr. Terrace Ferrara 11/03/2010 of his LAD. 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 This resected include hyperlipidemia not on statin therapy, history of tobacco abuse smoking 5-6 cigarettes a day off and on for the last 40 years.There is a family history of a father who had bypass surgery at age 13.   I performed left heart cath on him 08/31/2015 revealing a widely patent stent, no significant CAD with normal LV function.  Right after that he was diagnosed with a DVT and PE was placed on Xarelto .  His symptoms of dyspnea resolved.  He is being sent back now by Laray Platt PA-C because of elevated heart rate with exercise and some dyspnea.   I obtained a 2D echocardiogram on him 03/23/2018 which was essentially normal and a Myoview  stress test that showed diaphragmatic attenuation.  He did see Dr. Lawana Pray in the office for tachycardia and an event monitor showed short runs of nonsustained ventricular tachycardia and atrial tachycardia.  He was prescribed metoprolol  twice daily.  His tachycardic episodes have markedly improved.  He is no longer short of breath and is able to exercise without limitation.  He denies chest pain.  Our pharmacist Raquel is also working with him to initiate Repatha  therapy for his hyperlipidemia and statin intolerance.   Since I saw him in the office 4  months ago [facility] with acute onset of shortness of breath while doing physical therapy.  His workup was unrevealing including a low BNP of 20, negative troponins.  Chest CT showed no evidence of pulmonary emboli although there were changes suggestive of congestive heart failure/pulmonary edema.  He did have an episode of chest pain with radiation to his throat and jaw yesterday after playing golf.  I performed cardiac PET study on him 03/23/2023 which was low risk and nonischemic done to clear him for his competitive bicycling.  He also had a fairly normal cardiac catheterization at [facility] 06/24/21 after developing acute onset shortness of breath after stopping his Xarelto  for his right total knee replacement.  I favor coronary angiography with Lovenox  bridging.   Current Meds  Medication Sig   acetaminophen  (TYLENOL ) 500 MG tablet Take 500 mg by mouth every 6 (six) hours as needed.   aluminum  hydroxide-magnesium  carbonate (GAVISCON) 95-358 MG/15ML SUSP Take 15 mLs by mouth as needed for indigestion or heartburn.   amoxicillin-clavulanate (AUGMENTIN) 875-125 MG tablet Take 1 tablet by mouth 2 (two) times daily.   docusate sodium  (COLACE) 100 MG capsule Take 1 capsule (100 mg total) by mouth 2 (two) times daily.   Evolocumab  (REPATHA  SURECLICK) 140 MG/ML SOAJ INJECT 1 PEN UNDER THE SKIN EVERY 14 DAYS   ezetimibe  (ZETIA ) 10 MG tablet Take 1 tablet (10 mg total) by mouth daily.   hydrocortisone  valerate cream (WESTCORT )  0.2 % Apply 1 application topically 2 (two) times daily as needed (irritation).   metoprolol  tartrate (LOPRESSOR ) 25 MG tablet Take 1 tablet by mouth once daily   Multiple Vitamin (MULTIVITAMIN WITH MINERALS) TABS tablet Take 1 tablet by mouth daily.   Omega-3 1000 MG CAPS Take 1,000 mg by mouth daily.   omeprazole (PRILOSEC) 20 MG capsule Take 20 mg by mouth daily.   rivaroxaban  (XARELTO ) 20 MG TABS tablet TAKE 1 TABLET BY MOUTH ONCE DAILY  WITH SUPPER   rOPINIRole  (REQUIP ) 0.25 MG tablet Take 1 tablet (0.25 mg total) by mouth at bedtime.   Wheat Dextrin (BENEFIBER DRINK MIX PO) Take 1 packet by mouth in the morning and at bedtime.     No Known Allergies  Social History   Socioeconomic History   Marital status: Married    Spouse name: Not on file   Number of children: Not on file   Years of education: Not on file   Highest education level: Not on file  Occupational History   Not on file  Tobacco Use   Smoking status: Former    Current packs/day: 0.00    Average packs/day: 0.3 packs/day for 43.0 years (10.8 ttl pk-yrs)    Types: Cigarettes    Start date: 03/22/1959    Quit date: 03/21/2002    Years since quitting: 21.3   Smokeless tobacco: Never  Vaping Use   Vaping status: Never Used  Substance and Sexual Activity   Alcohol use: Yes    Comment: rare   Drug use: No   Sexual activity: Not Currently  Other Topics Concern   Not on file  Social History Narrative   Not on file   Social Drivers of Health   Financial Resource Strain: Not on file  Food Insecurity: Not on file  Transportation Needs: Not on file  Physical Activity: Not on file  Stress: Not on file  Social Connections: Unknown (08/01/2021)   Received from [facility]   Social Network    Social Network: Not on file  Intimate Partner Violence: Unknown (06/23/2021)   Received from [facility], [facility]   HITS    Physically Hurt: Not on file    Insult or Talk Down To: Not on file    Threaten Physical Harm: Not on file    Scream or Curse: Not on file     Review of Systems: General: negative for chills, fever, night sweats or weight changes.  Cardiovascular: negative for chest pain, dyspnea on exertion, edema, orthopnea, palpitations, paroxysmal nocturnal dyspnea or shortness of breath Dermatological: negative for rash Respiratory: negative for cough or wheezing Urologic: negative for hematuria Abdominal: negative for  nausea, vomiting, diarrhea, bright red blood per rectum, melena, or hematemesis Neurologic: negative for visual changes, syncope, or dizziness All other systems reviewed and are otherwise negative except as noted above.    Blood pressure 132/88, pulse 75, height 5\' 10"  (1.778 m), weight 162 lb 3.2 oz (73.6 kg), SpO2 100%.  General appearance: alert and no distress Neck: no adenopathy, no carotid bruit, no JVD, supple, symmetrical, trachea midline, and thyroid not enlarged, symmetric, no tenderness/mass/nodules Lungs: clear to auscultation bilaterally Heart: regular rate and rhythm, S1, S2 normal, no murmur, click, rub or gallop Extremities: extremities normal, atraumatic, no cyanosis or edema Pulses: 2+ and symmetric Skin: Skin color, texture, turgor normal. No rashes or lesions Neurologic: Grossly normal  EKG not performed today      ASSESSMENT AND PLAN:   Coronary artery  disease involving native coronary artery of native heart without angina pectoris History of CAD status post rotational atherectomy, PCI and drug-eluting stenting x 2 by Dr. Terrace Ferrara of his LAD 11/03/2010.  I cathed him 08/31/2015 revealing a widely patent stent with no significant CAD.  He had a low risk cardiac PET study performed 03/23/2023.  He was seen in the ER a week ago after developing sudden onset of shortness of breath during physical therapy.  Chest CT was negative for PE.  His troponins were negative and his BNP was low.  Yesterday he had an episode of chest pain rating to his throat and jaw after playing golf.  He has been on uninterrupted Xarelto .  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  I favor recatheterization with Lovenox  bridging.  Hyperlipidemia History of hyperlipidemia on Zetia  and Repatha  with lipid profile performed 05/10/2023 revealing total cholesterol 163, LDL of 88 and HDL 55.  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  Hypertension History of essential hypertension with blood pressure measured today at 132/88.  He is on metoprolol .  Tachycardia History of nonsustained ventricular tachycardia on event monitoring improved with addition of metoprolol .  He has seen Dr. Lawana Pray in the past.  Thoracic aortic aneurysm [facility]) History of thoracic aortic aneurysm measuring 43 mm by recent CTA performed/14/25.  This will be repeated on an annual basis.     Avanell Leigh MD FACP,FACC,FAHA, [facility] 07/10/2023 9:24 AM

## 2023-07-11 ENCOUNTER — Ambulatory Visit: Admitting: Rehabilitative and Restorative Service Providers"

## 2023-07-11 LAB — CBC WITH DIFFERENTIAL/PLATELET
Basos: 0 %
EOS (ABSOLUTE): 0 10*3/uL (ref 0.0–0.2)
Eos: 1 %
Hematocrit: 47.4 % (ref 37.5–51.0)
Hemoglobin: 15.9 g/dL (ref 13.0–17.7)
Immature Granulocytes: 0 %
Immature Granulocytes: 0 10*3/uL (ref 0.0–0.1)
Lymphs: 20 %
MCH: 32.6 pg (ref 26.6–33.0)
MCHC: 33.5 g/dL (ref 31.5–35.7)
MCV: 97 fL (ref 79–97)
Monocytes Absolute: 0.1 10*3/uL (ref 0.0–0.4)
Monocytes Absolute: 0.7 10*3/uL (ref 0.1–0.9)
Monocytes: 12 %
Neutrophils Absolute: 1.1 10*3/uL (ref 0.7–3.1)
Neutrophils Absolute: 3.6 10*3/uL (ref 1.4–7.0)
Neutrophils: 67 %
Platelets: 289 10*3/uL (ref 150–450)
RBC: 4.87 x10E6/uL (ref 4.14–5.80)
RDW: 12.6 % (ref 11.6–15.4)
WBC: 5.5 10*3/uL (ref 3.4–10.8)

## 2023-07-11 LAB — BASIC METABOLIC PANEL WITH GFR
BUN/Creatinine Ratio: 23 (ref 10–24)
BUN: 24 mg/dL (ref 8–27)
CO2: 23 mmol/L (ref 20–29)
Calcium: 10 mg/dL (ref 8.6–10.2)
Chloride: 105 mmol/L (ref 96–106)
Creatinine, Ser: 1.06 mg/dL (ref 0.76–1.27)
Glucose: 85 mg/dL (ref 70–99)
Potassium: 4.9 mmol/L (ref 3.5–5.2)
Sodium: 142 mmol/L (ref 134–144)
eGFR: 71 mL/min/{1.73_m2} (ref >59)

## 2023-07-12 ENCOUNTER — Telehealth: Payer: Self-pay | Admitting: *Deleted

## 2023-07-12 NOTE — Telephone Encounter (Signed)
 Cardiac Catheterization scheduled at [facility] for: Thursday July 13, 2023 9 AM Arrival time [facility] Main Entrance A at: 7 AM  Nothing to eat after midnight prior to procedure, clear liquids until 5 AM day of procedure.  Medication instructions: -Hold:  Xarelto -none 4/21 until post procedure/Lovenox  bridge -Other usual morning medications can be taken with sips of water  including aspirin  81 mg.  Plan to go home the same day, you will only stay overnight if medically necessary.  You must have responsible adult to drive you home.  Someone must be with you the first 24 hours after you arrive home.  Reviewed procedure instructions with patient.

## 2023-07-13 ENCOUNTER — Ambulatory Visit (HOSPITAL_COMMUNITY)
Admission: RE | Admit: 2023-07-13 | Discharge: 2023-07-13 | Disposition: A | Discharge status: Home / Self Care | Attending: Cardiovascular Disease | Admitting: Cardiovascular Disease

## 2023-07-13 ENCOUNTER — Ambulatory Visit: Admitting: Rehabilitative and Restorative Service Providers"

## 2023-07-13 ENCOUNTER — Encounter (HOSPITAL_COMMUNITY)
Admission: RE | Disposition: A | Discharge status: Home / Self Care | Payer: Self-pay | Source: Home / Self Care | Attending: Cardiovascular Disease

## 2023-07-13 ENCOUNTER — Other Ambulatory Visit: Payer: Self-pay

## 2023-07-13 DIAGNOSIS — I25118 Atherosclerotic heart disease of native coronary artery with other forms of angina pectoris: Secondary | ICD-10-CM | POA: Diagnosis present

## 2023-07-13 DIAGNOSIS — I10 Essential (primary) hypertension: Secondary | ICD-10-CM | POA: Insufficient documentation

## 2023-07-13 DIAGNOSIS — E785 Hyperlipidemia, unspecified: Secondary | ICD-10-CM | POA: Diagnosis not present

## 2023-07-13 DIAGNOSIS — Z86718 Personal history of other venous thrombosis and embolism: Secondary | ICD-10-CM | POA: Diagnosis not present

## 2023-07-13 DIAGNOSIS — Z7901 Long term (current) use of anticoagulants: Secondary | ICD-10-CM | POA: Insufficient documentation

## 2023-07-13 DIAGNOSIS — I2510 Atherosclerotic heart disease of native coronary artery without angina pectoris: Secondary | ICD-10-CM

## 2023-07-13 DIAGNOSIS — Z86711 Personal history of pulmonary embolism: Secondary | ICD-10-CM | POA: Diagnosis not present

## 2023-07-13 DIAGNOSIS — I712 Thoracic aortic aneurysm, without rupture, unspecified: Secondary | ICD-10-CM | POA: Insufficient documentation

## 2023-07-13 DIAGNOSIS — Z79899 Other long term (current) drug therapy: Secondary | ICD-10-CM | POA: Insufficient documentation

## 2023-07-13 DIAGNOSIS — R000 Tachycardia, unspecified: Secondary | ICD-10-CM | POA: Insufficient documentation

## 2023-07-13 DIAGNOSIS — F17210 Nicotine dependence, cigarettes, uncomplicated: Secondary | ICD-10-CM | POA: Insufficient documentation

## 2023-07-13 DIAGNOSIS — R0609 Other forms of dyspnea: Secondary | ICD-10-CM

## 2023-07-13 DIAGNOSIS — Z96651 Presence of right artificial knee joint: Secondary | ICD-10-CM | POA: Diagnosis not present

## 2023-07-13 LAB — GLUCOSE, CAPILLARY
Glucose-Capillary: 101 mg/dL — ABNORMAL HIGH (ref 70–99)
Glucose-Capillary: 102 mg/dL — ABNORMAL HIGH (ref 70–99)

## 2023-07-13 LAB — POCT ACTIVATED CLOTTING TIME: Activated Clotting Time: 366 s

## 2023-07-13 SURGERY — LEFT HEART CATH AND CORONARY ANGIOGRAPHY
Anesthesia: LOCAL

## 2023-07-13 MED ORDER — FENTANYL CITRATE (PF) 100 MCG/2ML IJ SOLN
INTRAMUSCULAR | Status: DC | PRN
  Administered 2023-07-13: 25 ug via INTRAVENOUS

## 2023-07-13 MED ORDER — SODIUM CHLORIDE 0.9 % WEIGHT BASED INFUSION: 1.0000 mL/kg/h | INTRAVENOUS | Status: DC

## 2023-07-13 MED ORDER — SODIUM CHLORIDE 0.9 % WEIGHT BASED INFUSION
3.0000 mL/kg/h | INTRAVENOUS | Status: AC
Start: 2023-07-13 — End: 2023-07-13

## 2023-07-13 MED ORDER — HEPARIN SODIUM (PORCINE) 1000 UNIT/ML IJ SOLN
INTRAMUSCULAR | Status: AC
  Filled 2023-07-13: qty 10

## 2023-07-13 MED ORDER — MIDAZOLAM HCL 2 MG/2ML IJ SOLN
INTRAMUSCULAR | Status: DC | PRN
  Administered 2023-07-13: 1 mg via INTRAVENOUS

## 2023-07-13 MED ORDER — HEPARIN (PORCINE) IN NACL 1000-0.9 UT/500ML-% IV SOLN
INTRAVENOUS | Status: DC | PRN
  Administered 2023-07-13 (×2): 500 mL

## 2023-07-13 MED ORDER — FENTANYL CITRATE (PF) 100 MCG/2ML IJ SOLN
INTRAMUSCULAR | Status: AC
  Filled 2023-07-13: qty 2

## 2023-07-13 MED ORDER — ASPIRIN 81 MG PO CHEW: 81.0000 mg | CHEWABLE_TABLET | Freq: Once | ORAL | Status: DC

## 2023-07-13 MED ORDER — LIDOCAINE HCL (PF) 1 % IJ SOLN
INTRAMUSCULAR | Status: DC | PRN
  Administered 2023-07-13: 2 mL via INTRADERMAL

## 2023-07-13 MED ORDER — VERAPAMIL HCL 2.5 MG/ML IV SOLN
INTRAVENOUS | Status: DC | PRN
  Administered 2023-07-13: 10 mL via INTRA_ARTERIAL

## 2023-07-13 MED ORDER — MIDAZOLAM HCL 2 MG/2ML IJ SOLN
INTRAMUSCULAR | Status: AC
  Filled 2023-07-13: qty 2

## 2023-07-13 MED ORDER — LIDOCAINE HCL (PF) 1 % IJ SOLN
INTRAMUSCULAR | Status: AC
  Filled 2023-07-13: qty 30

## 2023-07-13 MED ORDER — IOHEXOL 350 MG/ML SOLN
INTRAVENOUS | Status: DC | PRN
  Administered 2023-07-13: 90 mL

## 2023-07-13 MED ORDER — HEPARIN SODIUM (PORCINE) 1000 UNIT/ML IJ SOLN
INTRAMUSCULAR | Status: DC | PRN
  Administered 2023-07-13: 3500 [IU] via INTRAVENOUS
  Administered 2023-07-13: 4000 [IU] via INTRAVENOUS

## 2023-07-13 MED ORDER — NITROGLYCERIN 1 MG/10 ML FOR IR/CATH LAB
INTRA_ARTERIAL | Status: DC
Start: 2023-07-13 — End: 2023-07-13
  Filled 2023-07-13: qty 10

## 2023-07-13 MED ORDER — VERAPAMIL HCL 2.5 MG/ML IV SOLN
INTRAVENOUS | Status: AC
  Filled 2023-07-13: qty 2

## 2023-07-13 MED ORDER — SODIUM CHLORIDE 0.9 % IV SOLN
INTRAVENOUS | Status: DC | PRN
  Administered 2023-07-13: 10 mL/h via INTRAVENOUS

## 2023-07-13 SURGICAL SUPPLY — 12 items
CATH INFINITI AMBI 5FR JK (CATHETERS) IMPLANT
CATH INFINITI JR4 5F (CATHETERS) IMPLANT
CATH LAUNCHER 6FR EBU 3.75 (CATHETERS) IMPLANT
DEVICE RAD COMP TR BAND LRG (VASCULAR PRODUCTS) IMPLANT
GLIDESHEATH SLEND SS 6F .021 (SHEATH) IMPLANT
GUIDEWIRE INQWIRE 1.5J.035X260 (WIRE) IMPLANT
GUIDEWIRE PRESSURE X 175 (WIRE) IMPLANT
KIT ENCORE 26 ADVANTAGE (KITS) IMPLANT
KIT ESSENTIALS PG (KITS) IMPLANT
PACK CARDIAC CATHETERIZATION (CUSTOM PROCEDURE TRAY) ×1 IMPLANT
SET ATX-X65L (MISCELLANEOUS) IMPLANT
TUBING CIL FLEX 10 FLL-RA (TUBING) IMPLANT

## 2023-07-13 NOTE — Discharge Instructions (Signed)

## 2023-07-13 NOTE — Interval H&P Note (Signed)
 History and Physical Interval Note:  07/13/2023 9:59 AM  Nathan Rodriguez  has presented today for surgery, with the diagnosis of shortness of breath.  The various methods of treatment have been discussed with the patient and family. After consideration of risks, benefits and other options for treatment, the patient has consented to  Procedure(s): LEFT HEART CATH AND CORONARY ANGIOGRAPHY (N/A) as a surgical intervention.  The patient's history has been reviewed, patient examined, no change in status, stable for surgery.  I have reviewed the patient's chart and labs.  Questions were answered to the patient's satisfaction.     Sua Spadafora

## 2023-07-14 ENCOUNTER — Encounter (HOSPITAL_COMMUNITY): Payer: Self-pay | Admitting: Cardiovascular Disease

## 2023-07-14 ENCOUNTER — Telehealth: Payer: Self-pay | Admitting: Cardiovascular Disease

## 2023-07-14 ENCOUNTER — Ambulatory Visit: Admitting: Family Medicine

## 2023-07-14 MED FILL — Nitroglycerin IV Soln 100 MCG/ML in D5W: INTRA_ARTERIAL | Qty: 10 | Status: AC

## 2023-07-14 NOTE — Telephone Encounter (Signed)
Spoke to patient Dr.Berry's advice given. 

## 2023-07-14 NOTE — Telephone Encounter (Signed)
 Patients wants to know if he can ride in a bike race on May 5th. Please advise

## 2023-07-14 NOTE — Telephone Encounter (Signed)
 Spoke to patient he wanted to ask Dr.Berry if ok if he rides a bike in bike race 5/5.Advised Dr.Berry is out of office today.I will send message to him for advice.

## 2023-07-18 ENCOUNTER — Encounter: Admitting: Rehabilitative and Restorative Service Providers"

## 2023-07-19 ENCOUNTER — Other Ambulatory Visit (HOSPITAL_COMMUNITY): Payer: Self-pay

## 2023-07-20 ENCOUNTER — Encounter: Admitting: Rehabilitative and Restorative Service Providers"

## 2023-07-25 ENCOUNTER — Other Ambulatory Visit (HOSPITAL_COMMUNITY): Payer: Self-pay

## 2023-07-26 ENCOUNTER — Other Ambulatory Visit (HOSPITAL_COMMUNITY): Payer: Self-pay

## 2023-08-01 ENCOUNTER — Ambulatory Visit: Admitting: Cardiovascular Disease

## 2023-08-02 ENCOUNTER — Ambulatory Visit: Admitting: Cardiovascular Disease

## 2023-08-08 ENCOUNTER — Encounter: Payer: Self-pay | Admitting: Cardiovascular Disease

## 2023-08-08 ENCOUNTER — Ambulatory Visit: Attending: Cardiovascular Disease | Admitting: Cardiovascular Disease

## 2023-08-08 ENCOUNTER — Encounter: Payer: Self-pay | Admitting: *Deleted

## 2023-08-08 VITALS — BP 131/94 | HR 95 | Ht 70.0 in | Wt 163.2 lb

## 2023-08-08 DIAGNOSIS — R0609 Other forms of dyspnea: Secondary | ICD-10-CM

## 2023-08-08 DIAGNOSIS — R002 Palpitations: Secondary | ICD-10-CM

## 2023-08-08 DIAGNOSIS — I2510 Atherosclerotic heart disease of native coronary artery without angina pectoris: Secondary | ICD-10-CM | POA: Diagnosis not present

## 2023-08-08 MED ORDER — METOPROLOL TARTRATE 25 MG PO TABS: 12.5000 mg | ORAL_TABLET | Freq: Every day | ORAL | 3 refills | Status: DC

## 2023-08-08 NOTE — Progress Notes (Signed)
 Mr. Odriscoll returns today for postprocedure follow-up.  I last saw him in the office/21/25.  I did set him up for an outpatient cardiac catheterization by Dr. Juliaette Ober which was performed/20/25.  This revealed patent LAD stent.  He did FFR analysis of the ostial circumflex which was 0.93.  He really denies chest pain.  He did stop his low-dose beta-blocker on his own and has noticed some inappropriate sinus tachycardia.  He had a Lovenox  bridge for his heart cath.  I am going to check a 2D echo and a 30-day event monitor and will have him see an APP back in 3 months and me back in 6 months.  I have asked him to go back on his beta-blocker.  I believe he had "beta-blocker withdrawal".  Avanell Leigh, M.D., FACP, [facility], [facility] [address] [place], [place]  [address]  [phone] 08/08/2023 11:08 AM

## 2023-08-08 NOTE — Patient Instructions (Signed)
 Medication Instructions:  - START Metoprolol  Tartrate (LOPRESSOR ) 12.5 MG BY MOUTH DAILY.    *If you need a refill on your cardiac medications before your next appointment, please call your pharmacy*   Lab Work: None    If you have labs (blood work) drawn today and your tests are completely normal, you will receive your results only by: MyChart Message (if you have MyChart) OR A paper copy in the mail If you have any lab test that is abnormal or we need to change your treatment, we will call you to review the results.   Testing/Procedures: Your physician has requested that you have an echocardiogram. Echocardiography is a painless test that uses sound waves to create images of your heart. It provides your doctor with information about the size and shape of your heart and how well your heart's chambers and valves are working. This procedure takes approximately one hour. There are no restrictions for this procedure. Please do NOT wear cologne, perfume, aftershave, or lotions (deodorant is allowed). Please arrive 15 minutes prior to your appointment time.  Please note: We ask at that you not bring children with you during ultrasound (echo/ vascular) testing. Due to room size and safety concerns, children are not allowed in the ultrasound rooms during exams. Our front office staff cannot provide observation of children in our lobby area while testing is being conducted. An adult accompanying a patient to their appointment will only be allowed in the ultrasound room at the discretion of the ultrasound technician under special circumstances. We apologize for any inconvenience.   [employer] Cardiac Event Monitor Instructions  Your physician has requested you wear your cardiac event monitor for _____ days, (1-30). [employer] may call or text to confirm a shipping address. The monitor will be sent to a land address via UPS. [employer] will not ship a monitor to a PO BOX. It typically takes 3-5  days to receive your monitor after it has been enrolled. [employer] will assist with USPS tracking if your package is delayed. The telephone number for [employer] is [phone]. Once you have received your monitor, please review the enclosed instructions. Instruction tutorials can also be viewed under help and settings on the enclosed cell phone. Your monitor has already been registered assigning a specific monitor serial # to you.  Billing and Self Pay Discount Information  [employer] has been provided the insurance information we had on file for you.  If your insurance has been updated, please call [employer] at [phone] to provide them with your updated insurance information.   [employer] offers a discounted Self Pay option for patients who have insurance that does not cover their cardiac event monitor or patients without insurance.  The discounted cost of a Self Pay Cardiac Event Monitor would be $225.00 , if the patient contacts [employer] at [phone] within 7 days of applying the monitor to make payment arrangements.  If the patient does not contact [employer] within 7 days of applying the monitor, the cost of the cardiac event monitor will be $350.00.  Applying the monitor  Remove cell phone from case and turn it on. The cell phone works as IT consultant and needs to be within [employer] of you at all times. The cell phone will need to be charged on a daily basis. We recommend you plug the cell phone into the enclosed charger at your bedside table every night.  Monitor batteries: You will receive two monitor batteries labelled #1 and #2. These are your recorders. Plug battery #  2 onto the second connection on the enclosed charger. Keep one battery on the charger at all times. This will keep the monitor battery deactivated. It will also keep it fully charged for when you need to switch your monitor batteries. A small light will be blinking on the battery emblem when it is  charging. The light on the battery emblem will remain on when the battery is fully charged.  Open package of a Monitor strip. Insert battery #1 into black hood on strip and gently squeeze monitor battery onto connection as indicated in instruction booklet. Set aside while preparing skin.  Choose location for your strip, vertical or horizontal, as indicated in the instruction booklet. Shave to remove all hair from location. There cannot be any lotions, oils, powders, or colognes on skin where monitor is to be applied. Wipe skin clean with enclosed Saline wipe. Dry skin completely.  Peel paper labeled #1 off the back of the Monitor strip exposing the adhesive. Place the monitor on the chest in the vertical or horizontal position shown in the instruction booklet. One arrow on the monitor strip must be pointing upward. Carefully remove paper labeled #2, attaching remainder of strip to your skin. Try not to create any folds or wrinkles in the strip as you apply it.  Firmly press and release the circle in the center of the monitor battery. You will hear a small beep. This is turning the monitor battery on. The heart emblem on the monitor battery will light up every 5 seconds if the monitor battery in turned on and connected to the patient securely. Do not push and hold the circle down as this turns the monitor battery off. The cell phone will locate the monitor battery. A screen will appear on the cell phone checking the connection of your monitor strip. This may read poor connection initially but change to good connection within the next minute. Once your monitor accepts the connection you will hear a series of 3 beeps followed by a climbing crescendo of beeps. A screen will appear on the cell phone showing the two monitor strip placement options. Touch the picture that demonstrates where you applied the monitor strip.  Your monitor strip and battery are waterproof. You are able to shower, bathe,  or swim with the monitor on. They just ask you do not submerge deeper than 3 feet underwater. We recommend removing the monitor if you are swimming in a lake, river, or ocean.  Your monitor battery will need to be switched to a fully charged monitor battery approximately once a week. The cell phone will alert you of an action which needs to be made.  On the cell phone, tap for details to reveal connection status, monitor battery status, and cell phone battery status. The green dots indicates your monitor is in good status. A red dot indicates there is something that needs your attention.  To record a symptom, click the circle on the monitor battery. In 30-60 seconds a list of symptoms will appear on the cell phone. Select your symptom and tap save. Your monitor will record a sustained or significant arrhythmia regardless of you clicking the button. Some patients do not feel the heart rhythm irregularities. [employer] will notify us  of any serious or critical events.  Refer to instruction booklet for instructions on switching batteries, changing strips, the Do not disturb or Pause features, or any additional questions.  Call [employer] at [phone], to confirm your monitor is transmitting and record your baseline. They  will answer any questions you may have regarding the monitor instructions at that time.  Returning the monitor to [employer]  Place all equipment back into blue box. Peel off strip of paper to expose adhesive and close box securely. There is a prepaid UPS shipping label on this box. Drop in a UPS drop box, or at a UPS facility like [employer]. You may also contact [employer] to arrange UPS to pick up monitor package at your home.    Follow-Up: At [facility], you and your health needs are our priority.  As part of our continuing mission to provide you with exceptional heart care, we have created designated Provider Care Teams.  These Care Teams include your primary  Cardiologist (physician) and Advanced Practice Providers (APPs -  Physician Assistants and Nurse Practitioners) who all work together to provide you with the care you need, when you need it.  We recommend signing up for the patient portal called "MyChart".  Sign up information is provided on this After Visit Summary.  MyChart is used to connect with patients for Virtual Visits (Telemedicine).  Patients are able to view lab/test results, encounter notes, upcoming appointments, etc.  Non-urgent messages can be sent to your provider as well.   To learn more about what you can do with MyChart, go to [email or web address].    Your next appointment:   3 month(s)  The format for your next appointment:   In Person  Provider:   One of our Advanced Practice Providers (APPs): Melita Springer, PA-C  Friddie Jetty, NP Evaline Hill, NP  Theotis Flake, PA-C Lawana Pray, NP  Willis Harter, PA-C Lovette Rud, PA-C  [place], PA-C Ernest Dick, NP  Marlana Silvan, NP Marcie Sever, PA-C  Laquita Plant, PA-C    Dayna Dunn, PA-C  Scott Weaver, PA-C Palmer Bobo, NP Katlyn West, NP Callie Goodrich, PA-C  Evan Williams, PA-C Sheng Haley, PA-C  Xika Zhao, NP Kathleen Johnson, PA-C   Then, Lauro Portal, MD will plan to see you again in 6 month(s).   Other Instructions

## 2023-08-08 NOTE — Progress Notes (Unsigned)
 Patient enrolled for [employer]/ [employer] to ship a 30 day cardiac event monitor to his address on file.

## 2023-08-21 NOTE — Progress Notes (Signed)
 Pulmonary Follow up Note - [facility]  [address], [place] [address] [phone] F [phone] [address], [address], [place] [address] [id number] [phone]     Pulmonary Medicine Outpatient Clinic New Patient Consult  Primary Care Provider: Arnulfo Jenna Sharps, MD Provider requesting consultation: Beverley Norleen Geneva Mickey., *     Dear Dr. Beverley, Norleen Geneva Mickey., *,  Thank you for the consultation request the patient's problem of sleep disturbance. Below is the documentation of today's visit.   Assessment/Plan:   1. Sleep disturbance  Adult Home Sleep Apnea Test Order (Sleep Provider Only)     CTA 07/03/2023 IMPRESSION: 1. No evidence of pulmonary embolism. 2. Diffuse mosaic attenuation and mild interlobular septal thickening, which can be seen in the setting of pulmonary edema. 3. Left atrial enlargement. 4. Ascending thoracic aortic aneurysm measures 4.3 cm, slightly increased from 4.1 cm. Recommend annual imaging followup by CTA or MRA. This recommendation follows 2010 ACCF/AHA/AATS/ACR/ASA/SCA/SCAI/SIR/STS/SVM Guidelines for the Diagnosis and Management of Patients with Thoracic Aortic Disease. Circulation. 2010; 121: Z733-z630. Aortic aneurysm NOS (ICD10-I71.9) 5. Aortic Atherosclerosis (ICD10-I70.0). Coronary artery calcifications. Assessment for potential risk factor modification, dietary therapy or pharmacologic therapy may be warranted, if clinically indicated.  #1 sleep disturbance Home sleep study to be set up  Duration of symptoms: 10+ year(s) Epworth Sleepiness Scale:       Chances of dozing (0-3):                                Situation:                       0                      Sitting and reading                      1                      Watching television                      3                      Sitting, inactive in public place (e.g. a theatre or a meeting)                       0                      Lying down to rest in the afternoon when circumstances permit                      0                      Sitting and talking to someone                             1                      Sitting quietly after a lunch without alcohol  0                     In a car, while stopped for a few minutes in traffic                       0                     As a passenger in a car for an hour without a break Sleepiness: yes Snoring: no Observed apneas: no Choking/Gasping during sleep: no Restless legs syndrome: no FEV1%: N/A Neck circumference: 15 inches  #2 dyspnea on exertion Transthoracic echocardiogram 07/15/2021 noted diastolic dysfunction which may contribute to dyspnea on exertion  Return to clinic after sleep study has been conducted to discuss results and formulate a treatment plan  Plan was discussed with Dr. Nathanael as my supervising physician  [facility]ice BROCKS. Dallis RIGGERS [facility] 08/21/2023 1:07 PM  I have personally spent 30 minutes involved in face-to-face and non-face-to-face activities for this patient on the day of the visit.  Professional time spent includes the following activities, in addition to those noted in the documentation: Chart review, care coordination and discussion of treatment/management plan   Interval History:    History of Present Illness:  Nathan Rodriguez presents today for new patient intake evaluation.  He is referred by his primary care provider's office with concern for sleep disturbance with history of obstructive sleep apnea.  He was diagnosed with obstructive sleep apnea 10 or more years ago.  He fought with the CPAP machine for about 6 months before discontinuing its use due to the difficulty he was having sleeping with it.  He has been having trouble sleeping through the night recently and has been having episodes of shortness of breath and memory disturbance.  He reports difficulty sleeping  through the night.  He has to urinate multiple times through the night but has significant vertigo and is afraid to leave the bed.  He denies history of snoring.  He has never been told that he stops breathing during his sleep.  He does not wake gasping.  He does not have any family history of obstructive sleep apnea that he is aware of.  He is a former social smoker.  He started smoking around age 70 (67).  He stopped smoking 20 years ago (2005).  He does exercise and has been up until recently exercising 7 days a week.  Previous Report Reviewed: historical medical records   The following portions of the patient's history were reviewed and updated as appropriate: allergies, current medications, past family history, past medical history, past social history, past surgical history, and problem list.   Tobacco status:  reports that he has quit smoking. He has never used smokeless tobacco.  Review of Systems A 14-point ROS was performed with pertinent positives/negatives noted in the HPI. The remainder of the ROS are negative.   Past History:   Active Ambulatory Problems    Diagnosis Date Noted  . Chronic anticoagulation 07/14/2021  . Acute deep vein thrombosis (DVT) of left lower extremity (HCC) 10/09/2015  . Pulmonary embolism, bilateral (HCC) 12/10/2015  . Heterozygous factor V Leiden mutation (HCC) 12/14/2015  . 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 Epistaxis 09/09/2016  . Abnormal chest CT 06/19/2018  . Subjective memory complaints 05/16/2019  . Mood disorder (HCC) 05/16/2019  . Vertigo 05/16/2019  .  Anxiety 06/17/2019  . Moderate episode of recurrent major depressive disorder (HCC) 08/20/2019  . Shortness of breath 07/13/2021  . Coronary artery disease involving native coronary artery of native heart 07/14/2021  . History of coronary artery stent placement 07/14/2021  . Increased heart rate 07/14/2021   . Diastasis recti 06/30/2023  . Sleep disturbance 08/21/2023   Resolved Ambulatory Problems    Diagnosis Date Noted  . No Resolved Ambulatory Problems   Past Medical History:  Diagnosis Date  . Bilateral pulmonary embolism    (CMD) 10/09/2015  . Factor V deficiency    (CMD)   . Hypercholesterolemia   . Memory loss    Family History  Problem Relation Name Age of Onset  . Breast cancer Mother    . Alzheimer's disease Mother    . Heart failure Father    . Factor V Leiden deficiency Father    . Diabetes Father    . Factor V Leiden deficiency Brother     Social History Social History   Tobacco Use  . Smoking status: Former  . Smokeless tobacco: Never  Substance Use Topics  . Alcohol use: Yes  . Drug use: No   Social History   Social History Narrative  . Not on file     Medications:   Meds Ordered in [facility]  Medication Sig Dispense Refill  . acetaminophen  (TYLENOL ) 325 mg tablet Take 650-975 mg by mouth every 6 (six) hours as needed.    . aluminum  hydroxide-magnesium  carbonate (GAVISCON) 95-358 mg/15 mL susp oral suspension Take 15 mL by mouth.    . Anucort-HC  25 mg suppository INSERT 1 SUPPOSITORY RECTALLY AT BEDTIME    . DOCOSAHEXAENOIC ACID ORAL Take 2 g by mouth.    . docusate sodium  (COLACE) 100 mg capsule Take 100 mg by mouth.    . evolocumab  140 mg/mL pnij Inject under the skin.    . hydrocortisone  valerate (WESTCORT ) 0.2 % cream Apply topically.    . magnesium  250 mg tablet Take 1 tablet by mouth Once Daily.    . metoprolol  tartrate (LOPRESSOR ) 25 mg tablet Take 1 tablet by mouth daily.    . multivitamin-iron-minerals-folic acid (Thera-M) 27-0.4 mg tab Take 1 tablet by mouth Once Daily.    SABRA omega-3 fatty acids-fish oil (FISH OIL) 1,000 mg cap capsule Take 1 capsule by mouth Once Daily.    SABRA omeprazole (PriLOSEC) 20 mg DR capsule Take 20 mg by mouth Once Daily.    . Repatha  SureClick 140 mg/mL pnij INJECT 1 PEN UNDER THE SKIN EVERY 14 DAYS    . rivaroxaban   (Xarelto ) 20 mg tablet Take 20 mg by mouth.    . rOPINIRole  (REQUIP ) 0.25 mg tablet Take 0.25 mg by mouth.    . tadalafiL (CIALIS) 5 mg tablet Take 1 tablet by mouth daily.    . WHEAT DEXTRIN ORAL Take 1 packet by mouth.    SABRA albuterol HFA (PROVENTIL HFA;VENTOLIN HFA;PROAIR HFA) 90 mcg/actuation inhaler Inhale 2 puffs every 6 (six) hours as needed for wheezing for up to 30 days. 1 Inhaler 11  . ezetimibe  (ZETIA ) 10 mg tablet Take 10 mg by mouth daily.     No current Epic-ordered facility-administered medications on file.      Physical Exam:   Vitals:   08/21/23 1102  BP: 120/81  BP Location: Left arm  Patient Position: Sitting  Pulse: 72  Resp: 17  Temp: 97.3 F (36.3 C)  TempSrc: Temporal  SpO2: 97%  Weight: 74.5 kg (164 lb  3.2 oz)  Height: 1.778 m (5' 10)   General appearance: alert, appears stated age, and cooperative BMI 23.56 HEEN: Normocephalic, without obvious abnormality, atraumatic PERRLA EOMI No ocular lesions Mallampatti 2 Throat: lips, mucosa, and tongue normal; teeth and gums normal  Neck: no adenopathy, no carotid bruit, no JVD, supple, symmetrical, trachea midline, and thyroid not enlarged, symmetric, no tenderness/mass/nodules Lungs: Auscultation: clear to auscultation bilaterally. Palpation of chest wall reveals normal fremitus of the thorax. Percussion: Normal no cough noted Heart: regular rate and rhythm, S1, S2 normal, no murmur, click, rub or gallop Peripheral lymphatic : no edema, no cyanosis no clubbing Vascular Pulses: 2+ and symmetric, no claudication Skin: Skin color, texture, turgor normal. No rashes or lesions Neurologic: Grossly normal with intact sensory , muscular exam.     Risks, benefits, and alternatives of the medication(s) and treatment plan(s) were discussed, and he expressed understanding. Plan follow up as discussed or as needed. No barriers to treatment identified in this visit.    Thank you for allowing me to participate in the care of  your patient. Please do not hesitate to contact my office any time if we can be of further assistance

## 2023-08-24 ENCOUNTER — Other Ambulatory Visit: Payer: Self-pay | Admitting: Orthopedic Surgery

## 2023-08-24 ENCOUNTER — Telehealth: Payer: Self-pay

## 2023-08-24 DIAGNOSIS — G8929 Other chronic pain: Secondary | ICD-10-CM

## 2023-08-24 NOTE — Telephone Encounter (Signed)
...     Pre-operative Risk Assessment    Patient Name: Nathan Rodriguez  DOB: [birth year] MRN: [id number]   Date of last office visit: 08/08/23 Date of next office visit: NONE   Request for Surgical Clearance    Procedure:  LEFT REVERSE TOTAL SHOULDER ARTHROPLASTY  Date of Surgery:  Clearance TBD                                Surgeon:  DR Sammye Cristal Surgeon's Group or Practice Name:  [facility] Phone number:  [phone] Fax number:  [phone]   Type of Clearance Requested:   MEDICAL PHARMACY [facility]    Type of Anesthesia:  CHOICE   Additional requests/questions:    Montel Antu   08/24/2023, 2:02 PM

## 2023-08-25 ENCOUNTER — Encounter: Payer: Self-pay | Admitting: Cardiology

## 2023-08-25 ENCOUNTER — Telehealth: Payer: Self-pay | Admitting: Cardiovascular Disease

## 2023-08-25 NOTE — Telephone Encounter (Signed)
 New Message:    Patient wants to know if he can take his Monitor off sooner than scheduled. He is having surgery on 09-21-23. He wants it off, before that date. He says , he is not having any symptoms at this time.

## 2023-08-25 NOTE — Telephone Encounter (Signed)
 Pt says he is wearing the event monitor that is due to stay on until 09/15/23... he is worried that it is cutting it close and it will hinder his clearance for his upcoming shoulder surgery 09/21/23... I advised him that we should try to get the entire 30 days in and it is live monitoring so we can pull any information we need even when he is wearing it.. he says he has been fairly asymptomatic and his biggest complaint is his shoulder pain.   I will let Dr Katheryne Pane know as he reviews his clearance that was sent to him yesterday.

## 2023-08-28 NOTE — Telephone Encounter (Signed)
 Nathan Leigh, MD  Juanna Norman hours ago (9:52 AM)    Agreed that since it's a live monitor it shouldn't interfere with his shoulder surgery    Pt advised and will keep on until it is due to come off.

## 2023-08-28 NOTE — Telephone Encounter (Signed)
 Patient with diagnosis of hx of DVT and PE on Xarelto  for anticoagulation.    Procedure: LEFT REVERSE TOTAL SHOULDER ARTHROPLASTY  Date of procedure: TBD        CrCl 59 mL/min Platelet count 289   Hx of acute onset shortness of breath after stopping his Xarelto  for his right total knee replacement.  Per office protocol, suggest patient can  hold Xarelto   for 3 days prior to procedure.   Patient will need bridging with Lovenox  (enoxaparin ) around procedure. Will defer  to Dr. Katheryne Pane for final approval. Once approved will provide Lovenox  bridge schedule     **This guidance is not considered finalized until pre-operative APP has relayed final recommendations.**

## 2023-08-29 ENCOUNTER — Ambulatory Visit
Admission: RE | Admit: 2023-08-29 | Discharge: 2023-08-29 | Disposition: A | Discharge status: Home / Self Care | Source: Ambulatory Visit | Attending: Orthopedic Surgery | Admitting: Orthopedic Surgery

## 2023-08-29 DIAGNOSIS — G8929 Other chronic pain: Secondary | ICD-10-CM

## 2023-08-31 NOTE — Telephone Encounter (Signed)
 I will reach out to surgeon office to see if they can give a date of surgery as to this will be needed so that Pharm-d can set up Lovenox  bridging.

## 2023-09-04 NOTE — Telephone Encounter (Signed)
 Left message for [facility] to call our office and ask for the preop team about the date of the procedure.

## 2023-09-04 NOTE — Telephone Encounter (Signed)
 Spoke with Maureen Sour from [facility]. The updated date for the pt procedure is 09/21/23.   Will route to the Preop APP and the Pharm-d with the update.

## 2023-09-05 MED ORDER — ENOXAPARIN SODIUM 80 MG/0.8ML IJ SOSY: PREFILLED_SYRINGE | INTRAMUSCULAR | 0 refills | Status: DC

## 2023-09-05 NOTE — Telephone Encounter (Signed)
 Bridging schedule discussed with pt June 29 -Take Xarelto  20 mg daily like normal once day in the evening   June 30 - inject Lovenox  80 mg subcutaneously in the fatty tissue of the abdomen in the evening only , No Xarelto   July 1 -inject Lovenox  80mg  subcutaneously in the fatty tissue of the abdomen in the morning and evening (12 hours apart), No Xarelto   July 2 inject Lovenox  80 mg in the morning only , No evening  dose, No Xarelto   July 3 - procedure date, No Lovenox , No Xarelto   July 4 - Resume Xarelto  20 mg daily as per MD's advise   Prescription for Lovenox  sent to the preferred pharmacy.   Patient would like to have printed copy so will print this schedule and leave at the desk for him to pick up

## 2023-09-07 ENCOUNTER — Telehealth: Payer: Self-pay | Admitting: Cardiovascular Disease

## 2023-09-07 MED ORDER — ENOXAPARIN SODIUM 80 MG/0.8ML IJ SOSY: 80.0000 mg | PREFILLED_SYRINGE | Freq: Two times a day (BID) | INTRAMUSCULAR | 0 refills | Status: AC

## 2023-09-07 NOTE — Telephone Encounter (Signed)
 Prescription sent with updated sig.

## 2023-09-07 NOTE — Telephone Encounter (Signed)
 Pt c/o medication issue:  1. Name of Medication:   enoxaparin  (LOVENOX ) 80 MG/0.8ML injection    2. How are you currently taking this medication (dosage and times per day)?  Use as directed       3. Are you having a reaction (difficulty breathing--STAT)? No  4. What is your medication issue? [place] from pharmacy is requesting a callback regarding needing clarity on directions for this medication and confirmation on 4 syringes. Please advise

## 2023-09-14 NOTE — Patient Instructions (Signed)
 SURGICAL WAITING ROOM VISITATION  Patients having surgery or a procedure may have no more than 2 support people in the waiting area - these visitors may rotate.    Children under the age of 38 must have an adult with them who is not the patient.  Visitors with respiratory illnesses are discouraged from visiting and should remain at home.  If the patient needs to stay at the hospital during part of their recovery, the visitor guidelines for inpatient rooms apply. Pre-op nurse will coordinate an appropriate time for 1 support person to accompany patient in pre-op.  This support person may not rotate.    Please refer to the [facility] website for the visitor guidelines for Inpatients (after your surgery is over and you are in a regular room).       Your procedure is scheduled on: 09/21/23   Report to [facility] Main Entrance    Report to admitting at : 7:30 AM   Call this number if you have problems the morning of surgery [phone]   Do not eat food :After Midnight.   After Midnight you may have the following liquids until : 7:00 AM DAY OF SURGERY  Water  Non-Citrus Juices (without pulp, NO RED-Apple, White grape, White cranberry) Black Coffee (NO MILK/CREAM OR CREAMERS, sugar ok)  Clear Tea (NO MILK/CREAM OR CREAMERS, sugar ok) regular and decaf                             Plain Jell-O (NO RED)                                           Fruit ices (not with fruit pulp, NO RED)                                     Popsicles (NO RED)                                                               Sports drinks like Gatorade (NO RED)   The day of surgery:  Drink ONE (1) Pre-Surgery Clear G2 at : 7:00 AM the morning of surgery. Drink in one sitting. Do not sip.  This drink was given to you during your hospital  pre-op appointment visit. Nothing else to drink after completing the  Pre-Surgery Clear Ensure or G2.          If you have questions, please contact your surgeon's  office.   FOLLOW ANY ADDITIONAL PRE OP INSTRUCTIONS YOU RECEIVED FROM YOUR SURGEON'S OFFICE!!!   Oral Hygiene is also important to reduce your risk of infection.                                    Remember - BRUSH YOUR TEETH THE MORNING OF SURGERY WITH YOUR REGULAR TOOTHPASTE  DENTURES WILL BE REMOVED PRIOR TO SURGERY PLEASE DO NOT APPLY Poly grip OR ADHESIVES!!!   Do NOT smoke after Midnight   Stop  all vitamins and herbal supplements 7 days before surgery.   Take these medicines the morning of surgery with A SIP OF WATER : omeprazole.Tylenol  as needed.                              You may not have any metal on your body including hair pins, jewelry, and body piercing             Do not wear lotions, powders, perfumes/cologne, or deodorant              Men may shave face and neck.   Do not bring valuables to the hospital. [facility] IS NOT             RESPONSIBLE   FOR VALUABLES.   Contacts, glasses, dentures or bridgework may not be worn into surgery.   Bring small overnight bag day of surgery.   DO NOT BRING YOUR HOME MEDICATIONS TO THE HOSPITAL. PHARMACY WILL DISPENSE MEDICATIONS LISTED ON YOUR MEDICATION LIST TO YOU DURING YOUR ADMISSION IN THE HOSPITAL!    Patients discharged on the day of surgery will not be allowed to drive home.  Someone NEEDS to stay with you for the first 24 hours after anesthesia.   Special Instructions: Bring a copy of your healthcare power of attorney and living will documents the day of surgery if you haven't scanned them before.              Please read over the following fact sheets you were given: IF YOU HAVE QUESTIONS ABOUT YOUR PRE-OP INSTRUCTIONS PLEASE CALL [phone]   If you received a COVID test during your pre-op visit  it is requested that you wear a mask when out in public, stay away from anyone that may not be feeling well and notify your surgeon if you develop symptoms. If you test positive for Covid or have been in contact with  anyone that has tested positive in the last 10 days please notify you surgeon.    [place]- Preparing for Total Shoulder Arthroplasty    Before surgery, you can play an important role. Because skin is not sterile, your skin needs to be as free of germs as possible. You can reduce the number of germs on your skin by using the following products. Benzoyl Peroxide Gel Reduces the number of germs present on the skin Applied twice a day to shoulder area starting two days before surgery    ==================================================================  Please follow these instructions carefully:  BENZOYL PEROXIDE 5% GEL  Please do not use if you have an allergy to benzoyl peroxide.   If your skin becomes reddened/irritated stop using the benzoyl peroxide.  Starting two days before surgery, apply as follows: Apply benzoyl peroxide in the morning and at night. Apply after taking a shower. If you are not taking a shower clean entire shoulder front, back, and side along with the armpit with a clean wet washcloth.  Place a quarter-sized dollop on your shoulder and rub in thoroughly, making sure to cover the front, back, and side of your shoulder, along with the armpit.   2 days before ____ AM   ____ PM              1 day before ____ AM   ____ PM                         Do  this twice a day for two days.  (Last application is the night before surgery, AFTER using the CHG soap as described below).  Do NOT apply benzoyl peroxide gel on the day of surgery.  Pre-operative 5 CHG Bath Instructions   You can play a key role in reducing the risk of infection after surgery. Your skin needs to be as free of germs as possible. You can reduce the number of germs on your skin by washing with CHG (chlorhexidine  gluconate) soap before surgery. CHG is an antiseptic soap that kills germs and continues to kill germs even after washing.   DO NOT use if you have an allergy to chlorhexidine /CHG or antibacterial  soaps. If your skin becomes reddened or irritated, stop using the CHG and notify one of our RNs at [phone].   Please shower with the CHG soap starting 4 days before surgery using the following schedule:     Please keep in mind the following:  DO NOT shave, including legs and underarms, starting the day of your first shower.   You may shave your face at any point before/day of surgery.  Place clean sheets on your bed the day you start using CHG soap. Use a clean washcloth (not used since being washed) for each shower. DO NOT sleep with pets once you start using the CHG.   CHG Shower Instructions:  If you choose to wash your hair and private area, wash first with your normal shampoo/soap.  After you use shampoo/soap, rinse your hair and body thoroughly to remove shampoo/soap residue.  Turn the water  OFF and apply about 3 tablespoons (45 ml) of CHG soap to a CLEAN washcloth.  Apply CHG soap ONLY FROM YOUR NECK DOWN TO YOUR TOES (washing for 3-5 minutes)  DO NOT use CHG soap on face, private areas, open wounds, or sores.  Pay special attention to the area where your surgery is being performed.  If you are having back surgery, having someone wash your back for you may be helpful. Wait 2 minutes after CHG soap is applied, then you may rinse off the CHG soap.  Pat dry with a clean towel  Put on clean clothes/pajamas   If you choose to wear lotion, please use ONLY the CHG-compatible lotions on the back of this paper.     Additional instructions for the day of surgery: DO NOT APPLY any lotions, deodorants, cologne, or perfumes.   Put on clean/comfortable clothes.  Brush your teeth.  Ask your nurse before applying any prescription medications to the skin.   CHG Compatible Lotions   Aveeno Moisturizing lotion  Cetaphil Moisturizing Cream  Cetaphil Moisturizing Lotion  Clairol Herbal Essence Moisturizing Lotion, Dry Skin  Clairol Herbal Essence Moisturizing Lotion, Extra Dry Skin   Clairol Herbal Essence Moisturizing Lotion, Normal Skin  Curel Age Defying Therapeutic Moisturizing Lotion with Alpha Hydroxy  Curel Extreme Care Body Lotion  Curel Soothing Hands Moisturizing Hand Lotion  Curel Therapeutic Moisturizing Cream, Fragrance-Free  Curel Therapeutic Moisturizing Lotion, Fragrance-Free  Curel Therapeutic Moisturizing Lotion, Original Formula  Eucerin Daily Replenishing Lotion  Eucerin Dry Skin Therapy Plus Alpha Hydroxy Crme  Eucerin Dry Skin Therapy Plus Alpha Hydroxy Lotion  Eucerin Original Crme  Eucerin Original Lotion  Eucerin Plus Crme Eucerin Plus Lotion  Eucerin TriLipid Replenishing Lotion  Keri Anti-Bacterial Hand Lotion  Keri Deep Conditioning Original Lotion Dry Skin Formula Softly Scented  Keri Deep Conditioning Original Lotion, Fragrance Free Sensitive Skin Formula  Keri Lotion Fast Absorbing Fragrance Free Sensitive  Skin Formula  Keri Lotion Fast Absorbing Softly Scented Dry Skin Formula  Keri Original Lotion  Keri Skin Renewal Lotion Keri Silky Smooth Lotion  Keri Silky Smooth Sensitive Skin Lotion  Nivea Body Creamy Conditioning Oil  Nivea Body Extra Enriched Lotion  Nivea Body Original Lotion  Nivea Body Sheer Moisturizing Lotion Nivea Crme  Nivea Skin Firming Lotion  NutraDerm 30 Skin Lotion  NutraDerm Skin Lotion  NutraDerm Therapeutic Skin Cream  NutraDerm Therapeutic Skin Lotion  ProShield Protective Hand Cream  Provon moisturizing lotion   Incentive Spirometer  An incentive spirometer is a tool that can help keep your lungs clear and active. This tool measures how well you are filling your lungs with each breath. Taking long deep breaths may help reverse or decrease the chance of developing breathing (pulmonary) problems (especially infection) following: A long period of time when you are unable to move or be active. BEFORE THE PROCEDURE  If the spirometer includes an indicator to show your best effort, your nurse or  respiratory therapist will set it to a desired goal. If possible, sit up straight or lean slightly forward. Try not to slouch. Hold the incentive spirometer in an upright position. INSTRUCTIONS FOR USE  Sit on the edge of your bed if possible, or sit up as far as you can in bed or on a chair. Hold the incentive spirometer in an upright position. Breathe out normally. Place the mouthpiece in your mouth and seal your lips tightly around it. Breathe in slowly and as deeply as possible, raising the piston or the ball toward the top of the column. Hold your breath for 3-5 seconds or for as long as possible. Allow the piston or ball to fall to the bottom of the column. Remove the mouthpiece from your mouth and breathe out normally. Rest for a few seconds and repeat Steps 1 through 7 at least 10 times every 1-2 hours when you are awake. Take your time and take a few normal breaths between deep breaths. The spirometer may include an indicator to show your best effort. Use the indicator as a goal to work toward during each repetition. After each set of 10 deep breaths, practice coughing to be sure your lungs are clear. If you have an incision (the cut made at the time of surgery), support your incision when coughing by placing a pillow or rolled up towels firmly against it. Once you are able to get out of bed, walk around indoors and cough well. You may stop using the incentive spirometer when instructed by your caregiver.  RISKS AND COMPLICATIONS Take your time so you do not get dizzy or light-headed. If you are in pain, you may need to take or ask for pain medication before doing incentive spirometry. It is harder to take a deep breath if you are having pain. AFTER USE Rest and breathe slowly and easily. It can be helpful to keep track of a log of your progress. Your caregiver can provide you with a simple table to help with this. If you are using the spirometer at home, follow these  instructions: SEEK MEDICAL CARE IF:  You are having difficultly using the spirometer. You have trouble using the spirometer as often as instructed. Your pain medication is not giving enough relief while using the spirometer. You develop fever of 100.5 F (38.1 C) or higher. SEEK IMMEDIATE MEDICAL CARE IF:  You cough up bloody sputum that had not been present before. You develop fever of 102 F (  38.9 C) or greater. You develop worsening pain at or near the incision site. MAKE SURE YOU:  Understand these instructions. Will watch your condition. Will get help right away if you are not doing well or get worse. Document Released: 07/18/2006 Document Revised: 05/30/2011 Document Reviewed: 09/18/2006 [facility] Patient Information 2014 [place], [place].   ________________________________________________________________________

## 2023-09-15 ENCOUNTER — Encounter (HOSPITAL_COMMUNITY): Payer: Self-pay

## 2023-09-15 ENCOUNTER — Encounter (HOSPITAL_COMMUNITY)
Admission: RE | Admit: 2023-09-15 | Discharge: 2023-09-15 | Disposition: A | Discharge status: Home / Self Care | Source: Ambulatory Visit | Attending: Orthopedic Surgery | Admitting: Orthopedic Surgery

## 2023-09-15 ENCOUNTER — Other Ambulatory Visit: Payer: Self-pay

## 2023-09-15 VITALS — BP 129/95 | HR 88 | Temp 97.7°F | Ht 68.5 in | Wt 164.0 lb

## 2023-09-15 DIAGNOSIS — Z01818 Encounter for other preprocedural examination: Secondary | ICD-10-CM | POA: Diagnosis present

## 2023-09-15 DIAGNOSIS — I10 Essential (primary) hypertension: Secondary | ICD-10-CM | POA: Insufficient documentation

## 2023-09-15 DIAGNOSIS — Z01812 Encounter for preprocedural laboratory examination: Secondary | ICD-10-CM | POA: Insufficient documentation

## 2023-09-15 HISTORY — DX: Peripheral vascular disease, unspecified: I73.9

## 2023-09-15 HISTORY — DX: Unspecified atrial fibrillation: I48.91

## 2023-09-15 LAB — BASIC METABOLIC PANEL WITH GFR
Anion gap: 9 (ref 5–15)
BUN: 21 mg/dL (ref 8–23)
CO2: 26 mmol/L (ref 22–32)
Calcium: 9.7 mg/dL (ref 8.9–10.3)
Chloride: 104 mmol/L (ref 98–111)
Creatinine, Ser: 1.01 mg/dL (ref 0.61–1.24)
GFR, Estimated: >60 mL/min (ref >60)
Glucose, Bld: 110 mg/dL — ABNORMAL HIGH (ref 70–99)
Potassium: 4.6 mmol/L (ref 3.5–5.1)
Sodium: 139 mmol/L (ref 135–145)

## 2023-09-15 LAB — CBC
HCT: 51.7 % (ref 39.0–52.0)
Hemoglobin: 16.4 g/dL (ref 13.0–17.0)
MCH: 32.4 pg (ref 26.0–34.0)
MCHC: 31.7 g/dL (ref 30.0–36.0)
MCV: 102.2 fL — ABNORMAL HIGH (ref 80.0–100.0)
Platelets: 276 10*3/uL (ref 150–400)
RBC: 5.06 MIL/uL (ref 4.22–5.81)
RDW: 12.7 % (ref 11.5–15.5)
WBC: 5.2 10*3/uL (ref 4.0–10.5)
nRBC: 0 % (ref 0.0–0.2)

## 2023-09-15 LAB — SURGICAL PCR SCREEN
MRSA, PCR: NEGATIVE
Staphylococcus aureus: NEGATIVE

## 2023-09-15 NOTE — Progress Notes (Signed)
 For Anesthesia: PCP - Beverley Norleen NOVAK, MD  Cardiologist - Court Dorn PARAS, MD LOV: 08/08/23  Bowel Prep reminder:  Chest x-ray -  EKG - 07/14/23 Stress Test -  ECHO - 08/08/23 Cardiac Cath - 07/13/23 Pacemaker/ICD device last checked: Pacemaker orders received: Device Rep notified:  Spinal Cord Stimulator:N/A  Sleep Study - Yes CPAP - NO  Fasting Blood Sugar - N/A Checks Blood Sugar _____ times a day Date and result of last Hgb A1c-  Last dose of GLP1 agonist- N/A GLP1 instructions:   Last dose of SGLT-2 inhibitors- N/A SGLT-2 instructions:   Blood Thinner Instructions: Xarelto : will be hold after: 09/17/23. Bridge with Lovenox :start on: 09/18/23 Aspirin  Instructions: Last Dose:  Activity level: Can go up a flight of stairs and activities of daily living without stopping and without chest pain and/or shortness of breath   Able to exercise without chest pain and/or shortness of breath  Anesthesia review: Hx: CAD,DVT,Afib,HTN,PE,Pre-DIA,OSA(NO CPAP).  Patient denies shortness of breath, fever, cough and chest pain at PAT appointment   Patient verbalized understanding of instructions that were reviewed over the telephone.

## 2023-09-18 ENCOUNTER — Ambulatory Visit (HOSPITAL_COMMUNITY)
Admission: RE | Admit: 2023-09-18 | Discharge: 2023-09-18 | Disposition: A | Discharge status: Home / Self Care | Source: Ambulatory Visit | Attending: Cardiovascular Disease | Admitting: Cardiovascular Disease

## 2023-09-18 ENCOUNTER — Ambulatory Visit (INDEPENDENT_AMBULATORY_CARE_PROVIDER_SITE_OTHER)

## 2023-09-18 ENCOUNTER — Ambulatory Visit: Payer: Self-pay | Admitting: Cardiovascular Disease

## 2023-09-18 DIAGNOSIS — R002 Palpitations: Secondary | ICD-10-CM | POA: Insufficient documentation

## 2023-09-18 DIAGNOSIS — R0609 Other forms of dyspnea: Secondary | ICD-10-CM | POA: Diagnosis present

## 2023-09-18 LAB — ECHOCARDIOGRAM COMPLETE
AR max vel: 1.62 cm2
AV Area VTI: 1.86 cm2
AV Area mean vel: 1.52 cm2
AV Mean grad: 11 mmHg
AV Peak grad: 19.2 mmHg
Ao pk vel: 2.19 m/s
Area-P 1/2: 3.61 cm2
S' Lateral: 2.65 cm

## 2023-09-19 NOTE — Telephone Encounter (Signed)
   Patient Name: Nathan Rodriguez  DOB: Mar 15, 1946 MRN: [id number]  Primary Cardiologist: Dorn Lesches, MD  Chart reviewed as part of pre-operative protocol coverage. Given past medical history and time since last visit, based on ACC/AHA guidelines, IZZAC ROCKETT is at acceptable risk for the planned procedure without further cardiovascular testing.   Bridging schedule discussed with pt June 29 -Take Xarelto  20 mg daily like normal once day in the evening    June 30 - inject Lovenox  80 mg subcutaneously in the fatty tissue of the abdomen in the evening only , No Xarelto    July 1 -inject Lovenox  80mg  subcutaneously in the fatty tissue of the abdomen in the morning and evening (12 hours apart), No Xarelto    July 2 inject Lovenox  80 mg in the morning only , No evening  dose, No Xarelto    July 3 - procedure date, No Lovenox , No Xarelto    July 4 - Resume Xarelto  20 mg daily as per MD's advise    Prescription for Lovenox  sent to the preferred pharmacy.    Patient would like to have printed copy so will print this schedule and leave at the desk for him to pick up    The patient was advised that if he develops new symptoms prior to surgery to contact our office to arrange for a follow-up visit, and he verbalized understanding.  I will route this recommendation to the requesting party via Epic fax function and remove from pre-op pool.  Please call with questions.  Wyn Raddle, Jackee Shove, NP 09/19/2023, 8:15 AM

## 2023-09-20 ENCOUNTER — Encounter (HOSPITAL_COMMUNITY): Payer: Self-pay
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
## 2023-09-20 NOTE — Anesthesia Preprocedure Evaluation (Signed)
 Anesthesia Evaluation  Patient identified by MRN, date of birth, ID band  Reviewed: Allergy & Precautions, NPO status , Patient's Chart, lab work & pertinent test results, reviewed documented beta blocker date and time   History of Anesthesia Complications (+) history of anesthetic complications  Airway Mallampati: II  TM Distance: >3 FB     Dental no notable dental hx.    Pulmonary sleep apnea and Continuous Positive Airway Pressure Ventilation , neg COPD, former smoker, PE (DVT/PE, provoked after sx)   breath sounds clear to auscultation       Cardiovascular hypertension, (-) angina + CAD, + Cardiac Stents and + Peripheral Vascular Disease  (-) Past MI and (-) CHF  Rhythm:Regular Rate:Normal     Neuro/Psych  Headaches, neg Seizures PSYCHIATRIC DISORDERS Anxiety Depression     Neuromuscular disease    GI/Hepatic ,GERD  Medicated and Controlled,,(+) neg Cirrhosis        Endo/Other    Renal/GU Renal disease     Musculoskeletal  (+) Arthritis , Osteoarthritis,    Abdominal   Peds  Hematology  (+) Blood dyscrasia (factor V leiden)   Anesthesia Other Findings   Reproductive/Obstetrics                              Anesthesia Physical Anesthesia Plan  ASA: 3  Anesthesia Plan: General   Post-op Pain Management: Regional block*   Induction: Intravenous  PONV Risk Score and Plan: 2 and Ondansetron  and Dexamethasone   Airway Management Planned: Oral ETT and Video Laryngoscope Planned  Additional Equipment:   Intra-op Plan:   Post-operative Plan: Extubation in OR  Informed Consent: I have reviewed the patients History and Physical, chart, labs and discussed the procedure including the risks, benefits and alternatives for the proposed anesthesia with the patient or authorized representative who has indicated his/her understanding and acceptance.     Dental advisory given  Plan  Discussed with: CRNA  Anesthesia Plan Comments: (See PAT note from 6/27)         Anesthesia Quick Evaluation

## 2023-09-21 ENCOUNTER — Ambulatory Visit (HOSPITAL_COMMUNITY): Admitting: Anesthesiology

## 2023-09-21 ENCOUNTER — Encounter (HOSPITAL_COMMUNITY)
Admission: RE | Disposition: A | Discharge status: Home / Self Care | Payer: Self-pay | Source: Home / Self Care | Attending: Orthopedic Surgery

## 2023-09-21 ENCOUNTER — Other Ambulatory Visit: Payer: Self-pay

## 2023-09-21 ENCOUNTER — Ambulatory Visit (HOSPITAL_COMMUNITY)
Admission: RE | Admit: 2023-09-21 | Discharge: 2023-09-21 | Disposition: A | Discharge status: Home / Self Care | Attending: Orthopedic Surgery | Admitting: Orthopedic Surgery

## 2023-09-21 ENCOUNTER — Encounter (HOSPITAL_COMMUNITY): Payer: Self-pay | Admitting: Medical

## 2023-09-21 ENCOUNTER — Encounter (HOSPITAL_COMMUNITY): Payer: Self-pay | Admitting: Orthopedic Surgery

## 2023-09-21 DIAGNOSIS — I119 Hypertensive heart disease without heart failure: Secondary | ICD-10-CM | POA: Diagnosis not present

## 2023-09-21 DIAGNOSIS — Z86718 Personal history of other venous thrombosis and embolism: Secondary | ICD-10-CM | POA: Insufficient documentation

## 2023-09-21 DIAGNOSIS — G4730 Sleep apnea, unspecified: Secondary | ICD-10-CM | POA: Insufficient documentation

## 2023-09-21 DIAGNOSIS — I2510 Atherosclerotic heart disease of native coronary artery without angina pectoris: Secondary | ICD-10-CM | POA: Insufficient documentation

## 2023-09-21 DIAGNOSIS — Z87891 Personal history of nicotine dependence: Secondary | ICD-10-CM | POA: Diagnosis not present

## 2023-09-21 DIAGNOSIS — M19012 Primary osteoarthritis, left shoulder: Secondary | ICD-10-CM

## 2023-09-21 DIAGNOSIS — Z7901 Long term (current) use of anticoagulants: Secondary | ICD-10-CM | POA: Diagnosis not present

## 2023-09-21 DIAGNOSIS — R519 Headache, unspecified: Secondary | ICD-10-CM | POA: Diagnosis not present

## 2023-09-21 DIAGNOSIS — Z7902 Long term (current) use of antithrombotics/antiplatelets: Secondary | ICD-10-CM | POA: Diagnosis not present

## 2023-09-21 DIAGNOSIS — I10 Essential (primary) hypertension: Secondary | ICD-10-CM

## 2023-09-21 DIAGNOSIS — Z86711 Personal history of pulmonary embolism: Secondary | ICD-10-CM | POA: Diagnosis not present

## 2023-09-21 DIAGNOSIS — F419 Anxiety disorder, unspecified: Secondary | ICD-10-CM | POA: Insufficient documentation

## 2023-09-21 DIAGNOSIS — D6851 Activated protein C resistance: Secondary | ICD-10-CM | POA: Diagnosis not present

## 2023-09-21 DIAGNOSIS — F32A Depression, unspecified: Secondary | ICD-10-CM | POA: Insufficient documentation

## 2023-09-21 DIAGNOSIS — Z955 Presence of coronary angioplasty implant and graft: Secondary | ICD-10-CM | POA: Insufficient documentation

## 2023-09-21 DIAGNOSIS — I739 Peripheral vascular disease, unspecified: Secondary | ICD-10-CM | POA: Diagnosis not present

## 2023-09-21 DIAGNOSIS — K219 Gastro-esophageal reflux disease without esophagitis: Secondary | ICD-10-CM | POA: Diagnosis not present

## 2023-09-21 DIAGNOSIS — Z79899 Other long term (current) drug therapy: Secondary | ICD-10-CM | POA: Insufficient documentation

## 2023-09-21 DIAGNOSIS — M8580 Other specified disorders of bone density and structure, unspecified site: Secondary | ICD-10-CM | POA: Insufficient documentation

## 2023-09-21 DIAGNOSIS — M75102 Unspecified rotator cuff tear or rupture of left shoulder, not specified as traumatic: Secondary | ICD-10-CM | POA: Diagnosis present

## 2023-09-21 DIAGNOSIS — M1990 Unspecified osteoarthritis, unspecified site: Secondary | ICD-10-CM | POA: Diagnosis not present

## 2023-09-21 LAB — GLUCOSE, CAPILLARY: Glucose-Capillary: 106 mg/dL — ABNORMAL HIGH (ref 70–99)

## 2023-09-21 SURGERY — ARTHROPLASTY, SHOULDER, TOTAL, REVERSE
Anesthesia: General | Site: Shoulder | Laterality: Left

## 2023-09-21 MED ORDER — METHOCARBAMOL 500 MG PO TABS: 500.0000 mg | ORAL_TABLET | Freq: Four times a day (QID) | ORAL | Status: DC | PRN

## 2023-09-21 MED ORDER — METHOCARBAMOL 1000 MG/10ML IJ SOLN: 500.0000 mg | Freq: Four times a day (QID) | INTRAMUSCULAR | Status: DC | PRN

## 2023-09-21 MED ORDER — PHENYLEPHRINE HCL-NACL 20-0.9 MG/250ML-% IV SOLN
INTRAVENOUS | Status: DC | PRN
  Administered 2023-09-21: 50 ug/min via INTRAVENOUS

## 2023-09-21 MED ORDER — ONDANSETRON HCL 4 MG/2ML IJ SOLN: 4.0000 mg | Freq: Once | INTRAMUSCULAR | Status: DC | PRN

## 2023-09-21 MED ORDER — FENTANYL CITRATE (PF) 100 MCG/2ML IJ SOLN
INTRAMUSCULAR | Status: AC
  Filled 2023-09-21: qty 2

## 2023-09-21 MED ORDER — OXYCODONE HCL 5 MG/5ML PO SOLN: 5.0000 mg | Freq: Once | ORAL | Status: DC | PRN

## 2023-09-21 MED ORDER — WATER FOR IRRIGATION, STERILE IR SOLN
Status: DC | PRN
Start: 2023-09-21 — End: 2023-09-21
  Administered 2023-09-21: 2000 mL

## 2023-09-21 MED ORDER — CHLORHEXIDINE GLUCONATE 0.12 % MT SOLN
15.0000 mL | Freq: Once | OROMUCOSAL | Status: AC
  Administered 2023-09-21: 15 mL via OROMUCOSAL

## 2023-09-21 MED ORDER — CEFAZOLIN SODIUM-DEXTROSE 2-4 GM/100ML-% IV SOLN
2.0000 g | INTRAVENOUS | Status: AC
  Administered 2023-09-21: 2 g via INTRAVENOUS
  Filled 2023-09-21: qty 100

## 2023-09-21 MED ORDER — SUGAMMADEX SODIUM 200 MG/2ML IV SOLN
INTRAVENOUS | Status: DC | PRN
  Administered 2023-09-21: 200 mg via INTRAVENOUS

## 2023-09-21 MED ORDER — POVIDONE-IODINE 7.5 % EX SOLN: Freq: Once | CUTANEOUS | Status: DC

## 2023-09-21 MED ORDER — BUPIVACAINE LIPOSOME 1.3 % IJ SUSP
INTRAMUSCULAR | Status: DC | PRN
Start: 2023-09-21 — End: 2023-09-21
  Administered 2023-09-21: 10 mL

## 2023-09-21 MED ORDER — PROPOFOL 10 MG/ML IV BOLUS
INTRAVENOUS | Status: DC | PRN
  Administered 2023-09-21: 120 mg via INTRAVENOUS

## 2023-09-21 MED ORDER — FENTANYL CITRATE PF 50 MCG/ML IJ SOSY
PREFILLED_SYRINGE | INTRAMUSCULAR | Status: AC
Start: 2023-09-21 — End: 2023-09-21
  Filled 2023-09-21: qty 2

## 2023-09-21 MED ORDER — ACETAMINOPHEN 10 MG/ML IV SOLN: 1000.0000 mg | Freq: Once | INTRAVENOUS | Status: DC | PRN

## 2023-09-21 MED ORDER — ONDANSETRON HCL 4 MG/2ML IJ SOLN
INTRAMUSCULAR | Status: AC
  Filled 2023-09-21: qty 2

## 2023-09-21 MED ORDER — PROPOFOL 10 MG/ML IV BOLUS
INTRAVENOUS | Status: AC
  Filled 2023-09-21: qty 20

## 2023-09-21 MED ORDER — LIDOCAINE HCL (PF) 2 % IJ SOLN
INTRAMUSCULAR | Status: AC
  Filled 2023-09-21: qty 5

## 2023-09-21 MED ORDER — 0.9 % SODIUM CHLORIDE (POUR BTL) OPTIME
TOPICAL | Status: DC | PRN
  Administered 2023-09-21: 1000 mL

## 2023-09-21 MED ORDER — BUPIVACAINE HCL (PF) 0.5 % IJ SOLN
INTRAMUSCULAR | Status: DC | PRN
  Administered 2023-09-21: 10 mL

## 2023-09-21 MED ORDER — ROCURONIUM BROMIDE 10 MG/ML (PF) SYRINGE
PREFILLED_SYRINGE | INTRAVENOUS | Status: AC
  Filled 2023-09-21: qty 10

## 2023-09-21 MED ORDER — SODIUM CHLORIDE 0.9 % IR SOLN
Status: DC | PRN
  Administered 2023-09-21: 1000 mL

## 2023-09-21 MED ORDER — FENTANYL CITRATE PF 50 MCG/ML IJ SOSY
25.0000 ug | PREFILLED_SYRINGE | INTRAMUSCULAR | Status: DC | PRN
  Administered 2023-09-21: 50 ug via INTRAVENOUS

## 2023-09-21 MED ORDER — ROCURONIUM BROMIDE 10 MG/ML (PF) SYRINGE
PREFILLED_SYRINGE | INTRAVENOUS | Status: DC | PRN
  Administered 2023-09-21: 50 mg via INTRAVENOUS

## 2023-09-21 MED ORDER — TRANEXAMIC ACID-NACL 1000-0.7 MG/100ML-% IV SOLN
1000.0000 mg | INTRAVENOUS | Status: AC
  Administered 2023-09-21: 1000 mg via INTRAVENOUS
  Filled 2023-09-21: qty 100

## 2023-09-21 MED ORDER — ONDANSETRON HCL 4 MG/2ML IJ SOLN
INTRAMUSCULAR | Status: DC | PRN
  Administered 2023-09-21: 4 mg via INTRAVENOUS

## 2023-09-21 MED ORDER — ORAL CARE MOUTH RINSE: 15.0000 mL | Freq: Once | OROMUCOSAL | Status: AC

## 2023-09-21 MED ORDER — FENTANYL CITRATE (PF) 250 MCG/5ML IJ SOLN
INTRAMUSCULAR | Status: DC | PRN
  Administered 2023-09-21 (×2): 50 ug via INTRAVENOUS

## 2023-09-21 MED ORDER — OXYCODONE HCL 5 MG PO TABS: 5.0000 mg | ORAL_TABLET | ORAL | 0 refills | Status: DC | PRN

## 2023-09-21 MED ORDER — OXYCODONE HCL 5 MG PO TABS: 5.0000 mg | ORAL_TABLET | Freq: Once | ORAL | Status: DC | PRN

## 2023-09-21 MED ORDER — TIZANIDINE HCL 4 MG PO TABS: 4.0000 mg | ORAL_TABLET | Freq: Three times a day (TID) | ORAL | 1 refills | Status: DC | PRN

## 2023-09-21 MED ORDER — DEXAMETHASONE SODIUM PHOSPHATE 10 MG/ML IJ SOLN
INTRAMUSCULAR | Status: DC | PRN
  Administered 2023-09-21: 5 mg via INTRAVENOUS

## 2023-09-21 MED ORDER — DEXAMETHASONE SODIUM PHOSPHATE 10 MG/ML IJ SOLN
INTRAMUSCULAR | Status: AC
  Filled 2023-09-21: qty 1

## 2023-09-21 MED ORDER — LACTATED RINGERS IV SOLN: INTRAVENOUS | Status: DC

## 2023-09-21 SURGICAL SUPPLY — 66 items
BAG COUNTER SPONGE SURGICOUNT (BAG) IMPLANT
BAG ZIPLOCK 12X15 (MISCELLANEOUS) ×1 IMPLANT
BASEPLATE GLEN ALTIVATE 6.5 (Joint) IMPLANT
BIT DRILL 4 DIA CALIBRATED (BIT) IMPLANT
BLADE SAW SGTL 73X25 THK (BLADE) ×1 IMPLANT
BOOTIES KNEE HIGH SLOAN (MISCELLANEOUS) ×2 IMPLANT
CLSR STERI-STRIP ANTIMIC 1/2X4 (GAUZE/BANDAGES/DRESSINGS) IMPLANT
COOLER ICEMAN CLASSIC (MISCELLANEOUS) ×1 IMPLANT
COVER BACK TABLE 60X90IN (DRAPES) ×1 IMPLANT
COVER SURGICAL LIGHT HANDLE (MISCELLANEOUS) ×1 IMPLANT
DRAPE INCISE IOBAN 66X45 STRL (DRAPES) ×1 IMPLANT
DRAPE POUCH INSTRU U-SHP 10X18 (DRAPES) ×1 IMPLANT
DRAPE SHEET LG 3/4 BI-LAMINATE (DRAPES) ×1 IMPLANT
DRAPE SURG 17X11 SM STRL (DRAPES) ×1 IMPLANT
DRAPE SURG ORHT 6 SPLT 77X108 (DRAPES) ×2 IMPLANT
DRAPE TOP 10253 STERILE (DRAPES) ×1 IMPLANT
DRAPE U-SHAPE 47X51 STRL (DRAPES) ×1 IMPLANT
DRESSING AQUACEL AG SP 3.5X10 (GAUZE/BANDAGES/DRESSINGS) IMPLANT
DRSG AQUACEL AG ADV 3.5X 6 (GAUZE/BANDAGES/DRESSINGS) ×1 IMPLANT
DURAPREP 26ML APPLICATOR (WOUND CARE) ×2 IMPLANT
ELECT BLADE TIP CTD 4 INCH (ELECTRODE) ×1 IMPLANT
ELECT PENCIL ROCKER SW 15FT (MISCELLANEOUS) ×1 IMPLANT
ELECT REM PT RETURN 15FT ADLT (MISCELLANEOUS) ×1 IMPLANT
FACESHIELD WRAPAROUND (MASK) ×1 IMPLANT
FACESHIELD WRAPAROUND OR TEAM (MASK) ×1 IMPLANT
GLOVE BIO SURGEON STRL SZ7.5 (GLOVE) ×1 IMPLANT
GLOVE BIOGEL PI IND STRL 6.5 (GLOVE) ×1 IMPLANT
GLOVE BIOGEL PI IND STRL 8 (GLOVE) ×1 IMPLANT
GLOVE SURG SS PI 6.5 STRL IVOR (GLOVE) ×1 IMPLANT
GOWN STRL REUS W/ TWL LRG LVL3 (GOWN DISPOSABLE) ×1 IMPLANT
GOWN STRL REUS W/ TWL XL LVL3 (GOWN DISPOSABLE) ×1 IMPLANT
GUIDE WIRE ALTIVATE 2.4X228 SL (WIRE) IMPLANT
HEAD GLENOID W/SCREW 32MM (Shoulder) IMPLANT
HOOD PEEL AWAY T7 (MISCELLANEOUS) ×3 IMPLANT
INSERT EPOLYSTD HUMERUS 36MM (Shoulder) IMPLANT
KIT BASIN OR (CUSTOM PROCEDURE TRAY) ×1 IMPLANT
KIT TURNOVER KIT A (KITS) ×1 IMPLANT
MANIFOLD NEPTUNE II (INSTRUMENTS) ×1 IMPLANT
NDL TROCAR POINT SZ 2 1/2 (NEEDLE) IMPLANT
NEEDLE TROCAR POINT SZ 2 1/2 (NEEDLE) IMPLANT
NS IRRIG 1000ML POUR BTL (IV SOLUTION) ×1 IMPLANT
PACK SHOULDER (CUSTOM PROCEDURE TRAY) ×1 IMPLANT
PAD COLD SHLDR WRAP-ON (PAD) ×1 IMPLANT
RESTRAINT HEAD UNIVERSAL NS (MISCELLANEOUS) ×1 IMPLANT
RETRIEVER SUT HEWSON (MISCELLANEOUS) IMPLANT
SCREW BONE RSP LOCK 5X14 (Screw) IMPLANT
SCREW BONE RSP LOCK 5X18 (Screw) IMPLANT
SCREW BONE RSP LOCK 5X26 (Screw) IMPLANT
SCREW CENTR ALTIVATE 6.5X30 (Screw) IMPLANT
SET HNDPC FAN SPRY TIP SCT (DISPOSABLE) ×1 IMPLANT
SLING ARM FOAM STRAP LRG (SOFTGOODS) IMPLANT
SLING ARM FOAM STRAP MED (SOFTGOODS) IMPLANT
STEM HUMERAL STD SHELL 14X48 (Miscellaneous) IMPLANT
STRIP CLOSURE SKIN 1/2X4 (GAUZE/BANDAGES/DRESSINGS) ×1 IMPLANT
SUCTION TUBE FRAZIER 10FR DISP (SUCTIONS) IMPLANT
SUPPORT WRAP ARM LG (MISCELLANEOUS) ×1 IMPLANT
SUT ETHIBOND 2 V 37 (SUTURE) IMPLANT
SUT MNCRL AB 4-0 PS2 18 (SUTURE) ×1 IMPLANT
SUT VIC AB 2-0 CT1 TAPERPNT 27 (SUTURE) ×2 IMPLANT
SUTURE FIBERWR#2 38 REV NDL BL (SUTURE) IMPLANT
TAP CANN GLEN 6.5 (TAP) IMPLANT
TAPE LABRALWHITE 1.5X36 (TAPE) IMPLANT
TAPE SUT LABRALTAP WHT/BLK (SUTURE) IMPLANT
TOWEL OR 17X26 10 PK STRL BLUE (TOWEL DISPOSABLE) ×1 IMPLANT
TUBE SUCTION HIGH CAP CLEAR NV (SUCTIONS) ×1 IMPLANT
WATER STERILE IRR 1000ML POUR (IV SOLUTION) ×1 IMPLANT

## 2023-09-21 NOTE — Anesthesia Procedure Notes (Signed)
 Procedure Name: Intubation Date/Time: 09/21/2023 7:22 AM  Performed by: Dasie Nena PARAS, CRNAPre-anesthesia Checklist: Patient identified, Emergency Drugs available, Suction available, Patient being monitored and Timeout performed Patient Re-evaluated:Patient Re-evaluated prior to induction Oxygen Delivery Method: Circle system utilized Preoxygenation: Pre-oxygenation with 100% oxygen Induction Type: IV induction Ventilation: Mask ventilation without difficulty Laryngoscope Size: Miller and 3 Grade View: Grade II Tube type: Oral Tube size: 7.5 mm Number of attempts: 1 Airway Equipment and Method: Stylet Placement Confirmation: ETT inserted through vocal cords under direct vision, positive ETCO2 and breath sounds checked- equal and bilateral Secured at: 24 cm Tube secured with: Tape Dental Injury: Teeth and Oropharynx as per pre-operative assessment

## 2023-09-21 NOTE — Op Note (Signed)
 Procedure(s): ARTHROPLASTY, SHOULDER, TOTAL, REVERSE Procedure Note  EVO ADERMAN male 78 y.o. 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   Indications:  78 y.o. male  With endstage left shoulder arthritis with severe glenoid bone loss and history of rotator cuff injury.  pain and dysfunction interferes with quality life and sleep and proceed with surgery to decrease pain and improve function.     Surgeon: Josefa LELON Herring   Assistants: Jeoffrey Northern PA-C Amber was present and scrubbed throughout the procedure and was essential in positioning, retraction, exposure, and closure)  Anesthesia: General endotracheal anesthesia with preoperative interscalene block given by the attending anesthesiologist    Procedure Detail  ARTHROPLASTY, SHOULDER, TOTAL, REVERSE   Estimated Blood Loss:  200 mL         Drains: none  Blood Given: none          Specimens: none        Complications:  * No complications entered in OR log *         Disposition: PACU - hemodynamically stable.         Condition: stable      OPERATIVE FINDINGS:  A DJO Altivate pressfit reverse total shoulder arthroplasty was placed with a  size 14 stem, a 36 standard glenosphere on a wedge augmented baseplate, and a standard-mm poly insert. The base plate  fixation was very good, although screw lengths were short as expected given the severe bone loss.  PROCEDURE: The patient was identified in the preoperative holding area  where I personally marked the operative site after verifying site, side,  and procedure with the patient. An interscalene block given by  the attending anesthesiologist in the holding area and the patient was taken back to the operating room where all extremities were  carefully padded in  position after general anesthesia was induced. She  was placed in a beach-chair position and the operative upper extremity was  prepped and draped in a standard sterile fashion. An approximately 10-  cm incision was made from the tip of the coracoid process to the center  point of the humerus at the level of the axilla. Dissection was carried  down through subcutaneous tissues to the level of the cephalic vein  which was taken laterally with the deltoid. The pectoralis major was  retracted medially. The subdeltoid space was developed and the lateral  edge of the conjoined tendon was identified. The undersurface of  conjoined tendon was palpated and the musculocutaneous nerve was not in  the field. Retractor was placed underneath the conjoined and second  retractor was placed lateral into the deltoid. The circumflex humeral  artery and vessels were identified and clamped and coagulated. The  biceps tendon was tenotomized.  The subscapularis was taken down as a peel.  The  joint was then gently externally rotated while the capsule was released  from the humeral neck around to just beyond the 6 o'clock position. At  this point, the joint was dislocated and the humeral head was presented  into the wound. The excessive osteophyte formation was removed with a  large rongeur.  The cutting guide was used to make the appropriate  head cut and the head was saved for potentially bone grafting.  The glenoid was exposed with the arm in an  abducted extended position. The anterior and posterior labrum were  completely excised and the capsule was released circumferentially to  allow for exposure of the glenoid for preparation.  There is severe retroversion and volume loss as expected based on preoperative imaging.  The guide for the augmented component was used to place a central guidepin.  This was overreamed with the angled reamer.  Central screw was tapped.  The implant was impacted with the augment  posteriorly.  There was good fixation.  The central screw was placed with good bite.  The peripheral screw guide was then used after placing the central taper to place 4 peripheral screws.  The size 36 standard glenosphere was then impacted on the [facility] taper and the central screw was placed. The humerus was then again exposed and the diaphyseal reamers were used followed by the metaphyseal reamers. The final broach was left in place in the proximal trial was placed. The joint was reduced and with this implant it was felt that soft tissue tensioning was appropriate with excellent stability and excellent range of motion. Therefore, final humeral stem was placed press-fit.  And then the trial polyethylene inserts were tested again and the above implant was felt to be the most appropriate for final insertion. The joint was reduced taken through full range of motion and felt to be stable. Soft tissue tension was appropriate.  The joint was then copiously irrigated with pulse  lavage and the wound was then closed. The subscapularis was repaired with two #2 FiberWire sutures.  Skin was closed with 2-0 Vicryl in a deep dermal layer and 4-0  Monocryl for skin closure. Steri-Strips were applied. Sterile  dressings were then applied as well as a sling. The patient was allowed  to awaken from general anesthesia, transferred to stretcher, and taken  to recovery room in stable condition.   POSTOPERATIVE PLAN: The patient will be observed in the recovery room postoperatively and if his pain is well-controlled with regional anesthesia and he is hemodynamically stable he can be discharged home today with family.  We will keep him from formal therapy until 6 weeks to allow the baseplate to integrate.
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
## 2023-09-21 NOTE — Discharge Instructions (Addendum)
Discharge Instructions after Reverse Total Shoulder Arthroplasty   A sling has been provided for you. You are to wear this at all times (except for bathing and dressing), until your first post operative visit with Dr. Chandler. Please also wear while sleeping at night. While you bath and dress, let the arm/elbow extend straight down to stretch your elbow. Wiggle your fingers and pump your first while your in the sling to prevent hand swelling. Use ice on the shoulder intermittently over the first 48 hours after surgery. Continue to use ice or and ice machine as needed after 48 hours for pain control/swelling.  Pain medicine has been prescribed for you.  Use your medicine liberally over the first 48 hours, and then you can begin to taper your use. You may take Extra Strength Tylenol or Tylenol only in place of the pain pills. DO NOT take ANY nonsteroidal anti-inflammatory pain medications: Advil, Motrin, Ibuprofen, Aleve, Naproxen or Naprosyn.  Resume Xarelto the day after surgery Leave your dressing on until your first follow up visit.  You may shower with the dressing.  Hold your arm as if you still have your sling on while you shower. Simply allow the water to wash over the site and then pat dry. Make sure your axilla (armpit) is completely dry after showering.    Please call [phone] during normal business hours or [phone] after hours for any problems. Including the following:  - excessive redness of the incisions - drainage for more than 4 days - fever of more than 101.5 F  *Please note that pain medications will not be refilled after hours or on weekends.    Dental Antibiotics:  In most cases prophylactic antibiotics for Dental procdeures after total joint surgery are not necessary.  Exceptions are as follows:  1. History of prior total joint infection  2. Severely immunocompromised (Organ Transplant, cancer chemotherapy, Rheumatoid biologic meds such as Humera)  3.  Poorly controlled diabetes (A1C &gt; 8.0, blood glucose over 200)  If you have one of these conditions, contact your surgeon for an antibiotic prescription, prior to your dental procedure.  

## 2023-09-21 NOTE — Anesthesia Procedure Notes (Signed)
 Anesthesia Regional Block: Interscalene brachial plexus block   Pre-Anesthetic Checklist: , timeout performed,  Correct Patient, Correct Site, Correct Laterality,  Correct Procedure, Correct Position, site marked,  Risks and benefits discussed,  Pre-op evaluation,  At surgeon's request and post-op pain management  Laterality: Left  Prep: Maximum Sterile Barrier Precautions used, chloraprep       Needles:  Injection technique: Single-shot  Needle Type: Echogenic Stimulator Needle     Needle Length: 5cm  Needle Gauge: 22     Additional Needles:   Procedures:,,,, ultrasound used (permanent image in chart),,     Nerve Stimulator or Paresthesia:  Response: Biceps response  Additional Responses:   Narrative:  Start time: 09/21/2023 7:00 AM End time: 09/21/2023 7:05 AM Injection made incrementally with aspirations every 5 mL. Anesthesiologist: Keneth Lynwood POUR, MD

## 2023-09-21 NOTE — Transfer of Care (Signed)
 Immediate Anesthesia Transfer of Care Note  Patient: Nathan Rodriguez  Procedure(s) Performed: ARTHROPLASTY, SHOULDER, TOTAL, REVERSE (Left: Shoulder)  Patient Location: PACU  Anesthesia Type:GA combined with regional for post-op pain  Level of Consciousness: awake, drowsy, and patient cooperative  Airway & Oxygen Therapy: Patient Spontanous Breathing and Patient connected to face mask oxygen  Post-op Assessment: Report given to RN and Post -op Vital signs reviewed and stable  Post vital signs: Reviewed and stable  Last Vitals:  Vitals Value Taken Time  BP 157/113 09/21/23 08:51  Temp    Pulse 74 09/21/23 08:53  Resp 15 09/21/23 08:53  SpO2 99 % 09/21/23 08:53  Vitals shown include unfiled device data.  Last Pain:  Vitals:   09/21/23 0603  TempSrc: Oral  PainSc:       Patients Stated Pain Goal: 3 (09/21/23 0548)  Complications: No notable events documented.

## 2023-09-21 NOTE — Evaluation (Signed)
 Occupational Therapy Evaluation Patient Details Name: Nathan Rodriguez MRN: [id number] DOB: 06-16-1945 Today's Date: 09/21/2023   History of Present Illness   Mr. 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     Clinical Impressions Pt is s/p shoulder replacement of left dominant upper extremity on 09-21-23. Therapist provided education and instruction to the patient and his spouse with regards to ROM/exercises protocol, post-op precautions, UE and sling positioning, NWB status, donning upper extremity clothing, recommendations for bathing while maintaining shoulder precautions, use of ice for pain and edema management, use of ice machine, and correctly donning/doffing sling. Patient and spouse verbalized and demonstrated understanding as needed. Patient needed assistance to donn shirt, underwear, shorts, socks and shoes with instruction on compensatory strategies to perform ADLs. Patient to follow up with MD for further therapy needs.       If plan is discharge home, recommend the following:   Help with stairs or ramp for entrance;Assistance with cooking/housework;Assist for transportation;A little help with bathing/dressing/bathroom     Functional Status Assessment   Patient has had a recent decline in their functional status and demonstrates the ability to make significant improvements in function in a reasonable and predictable amount of time.     Equipment Recommendations   None recommended by OT     Recommendations for Other Services         Precautions/Restrictions   Precautions Precautions: Shoulder Type of Shoulder Precautions: no shoulder ROM, okay to perform elbow, wrist, and hand ROM, sling to be worn at all times except ADLs/exercise Shoulder Interventions: Shoulder sling/immobilizer Precaution Booklet  Issued: Yes (comment) Required Braces or Orthoses: Sling Restrictions Weight Bearing Restrictions Per Provider Order: Yes LUE Weight Bearing Per Provider Order: Non weight bearing     Mobility Bed Mobility               General bed mobility comments: pt was received seated in the chair    Transfers Overall transfer level: Needs assistance   Transfers: Sit to/from Stand Sit to Stand: Supervision                  Balance Overall balance assessment: No apparent balance deficits (not formally assessed)           ADL either performed or assessed with clinical judgement   ADL Overall ADL's : Needs assistance/impaired             Pertinent Vitals/Pain Pain Assessment Pain Assessment: No/denies pain     Extremity/Trunk Assessment Upper Extremity Assessment Upper Extremity Assessment: Left hand dominant           Communication Communication Communication: No apparent difficulties   Cognition Arousal: Alert Behavior During Therapy: WFL for tasks assessed/performed Cognition: No apparent impairments             OT - Cognition Comments: Oriented x4                 Following commands: Intact             Shoulder Instructions Shoulder Instructions Donning/doffing shirt without moving shoulder: Moderate assistance Method for sponge bathing under operated UE:  (Pt and spouse verbalized understanding) Donning/doffing sling/immobilizer: Maximal assistance Correct positioning of sling/immobilizer: Moderate assistance Pendulum exercises (written home exercise program):  (N/A) ROM for elbow, wrist and digits of operated UE:  (Pt's spouse  verbalized understanding) Sling wearing schedule (on at all times/off for ADL's):  (Pt's spouse verbalized understanding) Proper positioning of operated UE when showering:  (Pt's spouse verbalized understanding) Dressing change:  (N/A) Positioning of UE while sleeping:  (Pt and spouse verbalized  understanding)    Home Living Family/patient expects to be discharged to:: Private residence Living Arrangements: Spouse/significant other Available Help at Discharge: Family Type of Home: House Home Access: Stairs to enter Secretary/administrator of Steps: 1   Home Layout: Two level     Bathroom Shower/Tub: Producer, television/film/video: Handicapped height     Home Equipment: Shower seat - built in;Shower Counsellor (2 wheels);Cane - single point          Prior Functioning/Environment Prior Level of Function : Independent/Modified Independent;Driving             Mobility Comments: Independent with ambulation. ADLs Comments: He was independent with ADLs, driving, and sharing household chores with his spouse.    OT Problem List: Impaired UE functional use;Decreased range of motion   OT Treatment/Interventions:        OT Goals(Current goals can be found in the care plan section)   Acute Rehab OT Goals OT Goal Formulation: All assessment and education complete, DC therapy   OT Frequency:          AM-PAC OT 6 Clicks Daily Activity     Outcome Measure Help from another person eating meals?: None Help from another person taking care of personal grooming?: A Little Help from another person toileting, which includes using toliet, bedpan, or urinal?: A Little Help from another person bathing (including washing, rinsing, drying)?: A Little Help from another person to put on and taking off regular upper body clothing?: A Lot Help from another person to put on and taking off regular lower body clothing?: A Lot 6 Click Score: 17   End of Session Equipment Utilized During Treatment: Other (comment) (N/A) Nurse Communication: Other (comment) (shoulder education completed)  Activity Tolerance: Patient tolerated treatment well Patient left: in chair;with call bell/phone within reach;with family/visitor present  OT Visit Diagnosis: Muscle weakness  (generalized) (M62.81)                Time: 8893-8861 OT Time Calculation (min): 32 min Charges:  OT General Charges $OT Visit: 1 Visit OT Evaluation $OT Eval Moderate Complexity: 1 Mod OT Treatments $Self Care/Home Management : 8-22 mins    Nathan Rodriguez, OTR/L 09/21/2023, 1:04 PM

## 2023-09-21 NOTE — Anesthesia Postprocedure Evaluation (Signed)
 Anesthesia Post Note  Patient: Nathan Rodriguez  Procedure(s) Performed: ARTHROPLASTY, SHOULDER, TOTAL, REVERSE (Left: Shoulder)     Patient location during evaluation: PACU Anesthesia Type: General Level of consciousness: awake and alert Pain management: pain level controlled Vital Signs Assessment: post-procedure vital signs reviewed and stable Respiratory status: spontaneous breathing, nonlabored ventilation, respiratory function stable and patient connected to nasal cannula oxygen Cardiovascular status: blood pressure returned to baseline and stable Postop Assessment: no apparent nausea or vomiting Anesthetic complications: no   No notable events documented.  Last Vitals:  Vitals:   09/21/23 1100 09/21/23 1115  BP: (!) 133/91 (!) 125/93  Pulse: 85 81  Resp:    Temp:    SpO2: 98% 98%    Last Pain:  Vitals:   09/21/23 1054  TempSrc: Temporal  PainSc: 0-No pain                 Lynwood MARLA Cornea

## 2023-09-26 ENCOUNTER — Telehealth: Payer: Self-pay

## 2023-09-26 ENCOUNTER — Encounter (HOSPITAL_COMMUNITY): Payer: Self-pay | Admitting: Orthopedic Surgery

## 2023-09-26 NOTE — Telephone Encounter (Signed)
 Patient Advocate Encounter   DUE FOR RENEWAL   The patient was approved for a [employer] grant that will help cover the cost of REPATHA  Total amount awarded, $2,500.  Effective: 10/17/23 - 10/15/24   APW:[id number] ERW:[id number] Hmnle:[id number] [id number]   Ileana Lehmann, CPhT  Pharmacy Patient Advocate Specialist  Direct Number: [phone] Fax: [phone]

## 2023-10-29 NOTE — Progress Notes (Unsigned)
 Cardiology Office Note:    Date:  10/29/2023   ID:  Sourish, Allender March 14, 1946, MRN [id number]  PCP:  Beverley Norleen NOVAK, MD  Cardiologist:  Dorn Lesches, MD Electrophysiologist:  Soyla Gladis Norton, MD { Click to update primary MD,subspecialty MD or APP then REFRESH:1}    Referring MD: Beverley Norleen NOVAK, MD   Chief Complaint: follow-up of CAD and palpitations  History of Present Illness:    KAILON TREESE is a 78 y.o. 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 Lesches and presents today for follow-up of CAD and palpitations.     CAD - S/p rotations atherectomy and PCI with DES x2 to LAD in 10/2010.  - LHC in 08/2015 for further evaluation of dyspnea on exertion showed patent stent to LAD with no other significant CAD.  - Myoview  in 02/2018 showed no significant ischemia. - LHC in 05/2021 at [facility] showed patent stent to mid LAD with 40-50% focal lesion at distal end of stent, 10% proximal luminal irregularities of LAD, 10-20% wall irregularities of diagonals, and 40-50% stenosis of ostial LCX with 10-20% wall irregularities noted on in distal vessel. Continued medical therapy was recommended.  - Cardiac PET stress test 03/2023 was low risk with no evidence of ischemia or infarction.  - LHC in 06/2023 for further of chest pain and shortness of breath showed patent LAD stent with minimal restenosis and borderline significant stenosis (60%) at the distal edge of previously placed stent and 50% stenosis of ostial to proximal LCX with otherwise mild disease. FFR of LAD and LCX lesions were negative. Continued medical therapy recommended.    Palpitations - History of palpitations. Previously followed by Dr. Norton. - Monitor in 03/2018 showed sinus rhythm with sinus tachycardia but no atrial fibrillation. Symptoms were associated with sinus rhythm and sinus tachycardia vs atrial tachycardia. - Monitor from 08/16/2023 to 09/14/2023 showed sinus rhythm with runs of IVCD and one long run of SVT lasting almost 4 hours.  Thoracic Aortic Aneurysm - Cardiac PET stress test in 03/2023 showed enlargement of tubular ascending thoracic aorta measuring up to 4.2 x 4.1 cm. - Chest CTA in 06/2023 at [facility] showed ascending thoracic aortic aneurysm measuring 4.3 cm.  Factor V Deficiency History of PE/ DVT - Diagnosed with PE/ DVT in 2017. - Chest CTA in 06/2023 at [facility] during ED visit for acute worsening of shortness of breath was negative for PE.     Patient is primarily followed by Cardiology for CAD and palpitations as detailed above. He has remote PCI with DES to LAD in 2012 and has had multiple ischemic evaluations since then. He has had more issues with chest pain, shortness of breath, and palpitations this year.  He underwent cardiac PET stress test in 03/2023 which was low risk with no evidence of ischemia.  However, he then underwent outpatient LHC in 06/2023.  Further evaluation of chest pain and shortness of breath.  Cath showed patent stent to the LAD and otherwise only moderate nonobstructive disease.  Continue medical therapy was recommended.  Monitor was ordered in 07/2023 for further evaluation of palpitations and showed sinus rhythm with runs of IVCD and 1  run of SVT lasting almost 4 hours.  He had stopped taking his Metoprolol  on his own and there was concern for beta-blocker withdrawal, so he was advised to restart Lopressor  12.5 mg twice daily prior to starting the monitor.  Echo in 08/2023 showed LVEF of 55-60% with mild LVH and grade 1 diastolic dysfunction, normal RV function, mild AS, and borderline dilatation of ascending aorta  measuring 40 mm.  Patient presents today for routine follow-up. ***  CAD S/p DES x2 to LAD in 2012.  Has had multiple ischemic evaluations since then before evaluation of dyspnea and chest pain.  Most recently, had a negative cardiac PET stress test in 03/2023 and then a LHC in 06/2023 which showed patent LAD stent with otherwise moderate nonobstructive disease.  Continue medical therapy as recommended. - *** - Not on Aspirin  given need for full anticoagulation with Xarelto . - Intolerant to statins. Continue Zetia  10mg  daily and Repatha .  Palpitations History of palpitations and tachycardia.  Recent monitor in May/June/2025 showed sinus rhythm with runs of IVCD and 1 long run of SVT lasting almost 4 hours. - *** - Currently only on Lopressor  12.5mg  once a day at bedtime. *** - Previously seen by Dr. Inocencio. Will refer back to him for consideration of SVT ablation. ***  Mild Aortic Stenosis Noted on recent Echo in 08/2023.  - Can continue routine monitoring with serial Echos per guidelines.  Thoracic Aortic Aneurysm Recent chest CTA in 06/2023 at [facility] showed ascending thoracic aortic aneurysm measuring 4.3 cm. - Recommend repeat chest CTA in 06/2024.   Hypertension BP *** - Lopressor  ***  Hyperlipidemia Lipid panel in 04/2023: Total Cholesterol 163, Triglycerides 111, HDL 55, LDL 88. LDL goal <70. - Intolerant to statins in the past. *** - Continue Zetia  10mg  daily and Repatha .   Factor V Deficiency History of DVT/ PE - On lifelong anticoagulation with Xarelto .   EKGs/Labs/Other Studies Reviewed:    The following studies were reviewed:  Cardiac PET Stress Test 03/23/2023:   The study is normal. The study is low risk.   LV perfusion is normal. There is no evidence of ischemia. There is no evidence of infarction.   Rest left ventricular function is normal. Rest EF: 50%. Stress left ventricular function is normal. Stress EF: 59%. End diastolic cavity size is normal. End systolic  cavity size is normal. No evidence of transient ischemic dilation (TID) noted.   Myocardial blood flow was computed to be 0.58ml/g/min at rest and 1.67ml/g/min at stress. Global myocardial blood flow reserve was 2.83 and was normal.   Coronary calcium  assessment not performed due to prior revascularization. _______________  Left Cardiac Catheterization 07/13/2023:   Mid LM to Dist LM lesion is 20% stenosed.   Ost Cx to Prox Cx lesion is 50% stenosed.   Mid Cx lesion is 30% stenosed.   Lat 1st Diag lesion is 40% stenosed.   Mid LAD-1 lesion is 20% stenosed.   Mid LAD-2 lesion is 60% stenosed.   Prox RCA lesion is 30% stenosed.   The left ventricular systolic function is normal.   LV end diastolic pressure is normal.   The left ventricular ejection fraction is 55-65% by visual estimate.   1.  Patent mid LAD stent with minimal restenosis.  Borderline significant stenosis at the distal edge of the previously placed stent not significant by fractional flow reserve evaluation with an RFR ratio of 0.93.  This was also not significant by virtual FFR.  Moderate ostial left circumflex stenosis was  also not significant by fractional flow reserve with an RFR of 0.99. 2.  Normal LV systolic function and normal left ventricular end-diastolic pressure.   Recommendations: No clear culprit is identified for the patient's symptoms.  Recommend continuing aggressive medical therapy.    Diagnostic Dominance: Right  _______________  Monitor 08/16/2023 to 09/14/2023: SR/SB/ST Runs of IVCD Long runs of SVT _______________  Echocardiogram 09/18/2023:  1. Left ventricular ejection fraction, by estimation, is 55 to 60%. The  left ventricle has normal function. Left ventricular endocardial border  not optimally defined to evaluate regional wall motion. There is mild  concentric left ventricular  hypertrophy. Left ventricular diastolic parameters are consistent with  Grade I diastolic dysfunction (impaired  relaxation).   2. Right ventricular systolic function is normal. The right ventricular  size is normal. Tricuspid regurgitation signal is inadequate for assessing  PA pressure.   3. The mitral valve is grossly normal. No evidence of mitral valve  regurgitation. No evidence of mitral stenosis.   4. The aortic valve is tricuspid. There is mild calcification of the  aortic valve. There is mild thickening of the aortic valve. Aortic valve  regurgitation is not visualized. Mild aortic valve stenosis.   5. There is borderline dilatation of the ascending aorta, measuring 40  mm.   6. The inferior vena cava is normal in size with greater than 50%  respiratory variability, suggesting right atrial pressure of 3 mmHg.   EKG:  EKG not ordered today.   Recent Labs: 05/10/2023: ALT 26 09/15/2023: BUN 21; Creatinine, Ser 1.01; Hemoglobin 16.4; Platelets 276; Potassium 4.6; Sodium 139  Recent Lipid Panel    Component Value Date/Time   CHOL 163 05/10/2023 1011   TRIG 111 05/10/2023 1011   HDL 55 05/10/2023 1011   CHOLHDL 3.0 05/10/2023 1011   LDLCALC 88 05/10/2023 1011    Physical Exam:    Vital Signs: There were no vitals taken for this visit.    Wt Readings from Last 3 Encounters:  09/21/23 164 lb (74.4 kg)  09/15/23 164 lb (74.4 kg)  08/08/23 163 lb 3.2 oz (74 kg)     General: 78 y.o. male in no acute distress. HEENT: Normocephalic and atraumatic. Sclera clear.  Neck: Supple. No carotid bruits. No JVD. Heart: *** RRR. Distinct S1 and S2. No murmurs, gallops, or rubs.  Lungs: No increased work of breathing. Clear to ausculation bilaterally. No wheezes, rhonchi, or rales.  Abdomen: Soft, non-distended, and non-tender to palpation.  Extremities: No lower extremity edema.  Radial and distal pedal pulses 2+ and equal bilaterally. Skin: Warm and dry. Neuro: No focal deficits. Psych: Normal affect. Responds appropriately.   Assessment:    No diagnosis found.  Plan:     Disposition:  Follow up in ***   Signed, Aline FORBES Door, PA-C  10/29/2023 5:12 PM    [facility]

## 2023-10-31 ENCOUNTER — Other Ambulatory Visit: Payer: Self-pay | Admitting: Cardiovascular Disease

## 2023-10-31 DIAGNOSIS — E785 Hyperlipidemia, unspecified: Secondary | ICD-10-CM

## 2023-10-31 DIAGNOSIS — I350 Nonrheumatic aortic (valve) stenosis: Secondary | ICD-10-CM

## 2023-11-08 ENCOUNTER — Ambulatory Visit: Attending: Cardiology | Admitting: Student

## 2023-11-08 ENCOUNTER — Other Ambulatory Visit: Payer: Self-pay | Admitting: *Deleted

## 2023-11-08 ENCOUNTER — Encounter: Payer: Self-pay | Admitting: Student

## 2023-11-08 VITALS — BP 118/70 | HR 68 | Ht 69.0 in | Wt 163.0 lb

## 2023-11-08 DIAGNOSIS — I350 Nonrheumatic aortic (valve) stenosis: Secondary | ICD-10-CM | POA: Diagnosis not present

## 2023-11-08 DIAGNOSIS — I2510 Atherosclerotic heart disease of native coronary artery without angina pectoris: Secondary | ICD-10-CM | POA: Diagnosis not present

## 2023-11-08 DIAGNOSIS — R002 Palpitations: Secondary | ICD-10-CM

## 2023-11-08 DIAGNOSIS — Z86711 Personal history of pulmonary embolism: Secondary | ICD-10-CM

## 2023-11-08 DIAGNOSIS — Z86718 Personal history of other venous thrombosis and embolism: Secondary | ICD-10-CM

## 2023-11-08 DIAGNOSIS — I7121 Aneurysm of the ascending aorta, without rupture: Secondary | ICD-10-CM

## 2023-11-08 DIAGNOSIS — I10 Essential (primary) hypertension: Secondary | ICD-10-CM

## 2023-11-08 DIAGNOSIS — D682 Hereditary deficiency of other clotting factors: Secondary | ICD-10-CM

## 2023-11-08 NOTE — Patient Instructions (Signed)
 Medication Instructions:   Your physician recommends that you continue on your current medications as directed. Please refer to the Current Medication list given to you today.   *If you need a refill on your cardiac medications before your next appointment, please call your pharmacy*   Lab Work: NONE ORDERED  TODAY     If you have labs (blood work) drawn today and your tests are completely normal, you will receive your results only by: MyChart Message (if you have MyChart) OR A paper copy in the mail If you have any lab test that is abnormal or we need to change your treatment, we will call you to review the results.    Testing/Procedures:NONE ORDERED  TODAY     Follow-Up: At [facility], you and your health needs are our priority.  As part of our continuing mission to provide you with exceptional heart care, our providers are all part of one team.  This team includes your primary Cardiologist (physician) and Advanced Practice Providers or APPs (Physician Assistants and Nurse Practitioners) who all work together to provide you with the care you need, when you need it.   Your next appointment:    6 month(s)  Provider:    Dorn Lesches, MD    We recommend signing up for the patient portal called MyChart.  Sign up information is provided on this After Visit Summary.  MyChart is used to connect with patients for Virtual Visits (Telemedicine).  Patients are able to view lab/test results, encounter notes, upcoming appointments, etc.  Non-urgent messages can be sent to your provider as well.   To learn more about what you can do with MyChart, go to [email or web address].   Other Instructions

## 2023-11-14 ENCOUNTER — Ambulatory Visit: Admitting: Sports Medicine

## 2023-11-14 NOTE — Progress Notes (Unsigned)
   LILLETTE Ileana Collet, PhD, LAT, ATC acting as a scribe for Artist Lloyd, MD.  Nathan Rodriguez is a 78 y.o. male who presents to [facility] at [facility] today for exacerbation of his L knee pain. Pt was last seen for his knee on 12/28/22 and it was injected w/ Durolane.  Today, pt reports L knee pain has started to return over the last couple months. He is wondering if it is appropriate to repeat the gel shot.   Dx imaging: 07/26/21 L knee XR  Pertinent review of systems: No fevers or chills  Relevant historical information: Left shoulder replacement about 2 months ago.   Exam:  BP 104/76   Ht 5' 9 (1.753 m)   Wt 163 lb (73.9 kg)   BMI 24.07 kg/m  General: Well Developed, well nourished, and in no acute distress.   MSK: Left knee mild effusion normal-appearing otherwise    Lab and Radiology Results  Nathan Rodriguez presents to clinic today for Durling injection left knee 1/1 Procedure: Real-time Ultrasound Guided Injection of left knee joint superior lateral patellar space Device: Philips Affiniti 50G/GE Logiq Images permanently stored and available for review in PACS Verbal informed consent obtained.  Discussed risks and benefits of procedure. Warned about infection, bleeding, damage to structures among others. Patient expresses understanding and agreement Time-out conducted.   Noted no overlying erythema, induration, or other signs of local infection.   Skin prepped in a sterile fashion.   Local anesthesia: Topical Ethyl chloride.   With sterile technique and under real time ultrasound guidance: Durolane 60 mg injected into knee joint. Fluid seen entering the joint capsule.   Completed without difficulty   Advised to call if fevers/chills, erythema, induration, drainage, or persistent bleeding.   Images permanently stored and available for review in the ultrasound unit.  Impression: Technically successful ultrasound guided injection. Lot number:  [id number]     Assessment and Plan: 78 y.o. male with left knee pain due to DJD.  Plan for Durolane injection today.  Check back as needed.   PDMP not reviewed this encounter. Orders Placed This Encounter  Procedures   US  LIMITED JOINT SPACE STRUCTURES LOW LEFT(NO LINKED CHARGES)    Reason for Exam (SYMPTOM  OR DIAGNOSIS REQUIRED):   left knee pain    Preferred imaging location?:   [facility]   Meds ordered this encounter  Medications   Sodium Hyaluronate (DUROLANE) intra-articular injection 60 mg     Discussed warning signs or symptoms. Please see discharge instructions. Patient expresses understanding.   The above documentation has been reviewed and is accurate and complete Artist Lloyd, M.D.

## 2023-11-15 ENCOUNTER — Ambulatory Visit: Admitting: Family Medicine

## 2023-11-15 ENCOUNTER — Other Ambulatory Visit: Payer: Self-pay

## 2023-11-15 VITALS — BP 104/76 | Ht 69.0 in | Wt 163.0 lb

## 2023-11-15 DIAGNOSIS — M1712 Unilateral primary osteoarthritis, left knee: Secondary | ICD-10-CM

## 2023-11-15 DIAGNOSIS — M25562 Pain in left knee: Secondary | ICD-10-CM

## 2023-11-15 DIAGNOSIS — G8929 Other chronic pain: Secondary | ICD-10-CM

## 2023-11-15 MED ORDER — SODIUM HYALURONATE 60 MG/3ML IX PRSY
60.0000 mg | PREFILLED_SYRINGE | Freq: Once | INTRA_ARTICULAR | Status: AC
  Administered 2023-11-15: 60 mg via INTRA_ARTICULAR

## 2023-11-15 NOTE — Patient Instructions (Signed)
 Thank you for coming in today.   You received an injection today. Seek immediate medical attention if the joint becomes red, extremely painful, or is oozing fluid.

## 2023-11-16 ENCOUNTER — Telehealth: Payer: Self-pay

## 2023-11-16 NOTE — Telephone Encounter (Signed)
 Pt called reporting continued pain from yesterday's Durolane injection. Please advise.

## 2023-11-17 NOTE — Telephone Encounter (Signed)
 If not better we will do a cortisone shot next week.

## 2023-11-23 ENCOUNTER — Telehealth: Payer: Self-pay | Admitting: Cardiovascular Disease

## 2023-11-23 MED ORDER — METOPROLOL SUCCINATE ER 25 MG PO TB24: 12.5000 mg | ORAL_TABLET | Freq: Every day | ORAL | 3 refills | Status: AC

## 2023-11-23 NOTE — Telephone Encounter (Signed)
 Spoke with pt who states he would like to be switched to long acting dose of Metoprolol .  Will need to be sent to [facility], [place], [place]. Pt advised will forward to Callie Goodrich, [place] for further recommendation.  Pt verbalizes understanding and agrees with current plan.

## 2023-11-23 NOTE — Telephone Encounter (Signed)
Spoke with pt, aware of the recommendations. New script sent to the pharmacy  

## 2023-11-23 NOTE — Telephone Encounter (Signed)
 Pt c/o medication issue:  1. Name of Medication: metoprolol  tartrate (LOPRESSOR ) 25 MG tablet (Expired)   2. How are you currently taking this medication (dosage and times per day)? As written  3. Are you having a reaction (difficulty breathing--STAT)? No  4. What is your medication issue? Pt would like a c/b in regards to this medication he was told he could get a extended release.

## 2023-12-01 ENCOUNTER — Other Ambulatory Visit: Payer: Self-pay | Admitting: Orthopedic Surgery

## 2023-12-01 ENCOUNTER — Ambulatory Visit
Admission: RE | Admit: 2023-12-01 | Discharge: 2023-12-01 | Disposition: A | Discharge status: Home / Self Care | Source: Ambulatory Visit | Attending: Orthopedic Surgery | Admitting: Orthopedic Surgery

## 2023-12-01 DIAGNOSIS — Z96612 Presence of left artificial shoulder joint: Secondary | ICD-10-CM

## 2023-12-05 ENCOUNTER — Telehealth: Payer: Self-pay | Admitting: Cardiovascular Disease

## 2023-12-05 NOTE — Telephone Encounter (Signed)
 Spoke with pt regarding his request for records. Pt stated his PCP is with [facility] and he cannot see our records of the pt's latest testing. Pt requested the dates and tests/procedures he has had in the last year. Pt was given the information. Pt verbalized understanding. All questions if any were answered.

## 2023-12-05 NOTE — Telephone Encounter (Signed)
 Patient calling to speak to the nurse about the different procedure he has had to give to his PCP. Please advise

## 2023-12-10 ENCOUNTER — Other Ambulatory Visit: Payer: Self-pay | Admitting: Cardiovascular Disease

## 2023-12-10 DIAGNOSIS — I82599 Chronic embolism and thrombosis of other specified deep vein of unspecified lower extremity: Secondary | ICD-10-CM

## 2023-12-11 NOTE — Telephone Encounter (Signed)
 Prescription refill request for Xarelto  received.  Indication:dvt Last office visit:8/25 Weight:73.9  kg Age:78 Scr:1.01  6/25 CrCl:60.97  ml/min  Prescription refilled

## 2024-01-29 ENCOUNTER — Other Ambulatory Visit: Payer: Self-pay | Admitting: Cardiovascular Disease

## 2024-01-29 DIAGNOSIS — E785 Hyperlipidemia, unspecified: Secondary | ICD-10-CM

## 2024-01-29 DIAGNOSIS — I350 Nonrheumatic aortic (valve) stenosis: Secondary | ICD-10-CM

## 2024-04-04 ENCOUNTER — Telehealth: Payer: Self-pay | Admitting: Family Medicine

## 2024-04-04 ENCOUNTER — Other Ambulatory Visit: Payer: Self-pay

## 2024-04-04 DIAGNOSIS — G8929 Other chronic pain: Secondary | ICD-10-CM

## 2024-04-04 MED ORDER — TIZANIDINE HCL 4 MG PO TABS: 4.0000 mg | ORAL_TABLET | Freq: Three times a day (TID) | ORAL | 1 refills | Status: AC | PRN

## 2024-04-04 NOTE — Telephone Encounter (Addendum)
 Patient asked if he could be squeezed in today and stated it was an emergency. He is on the cancellation list.

## 2024-04-04 NOTE — Telephone Encounter (Signed)
 Patient called and really would like to be seen today. Patients left shoulder last night got completely swollen and he can barely move arm without really intense pain. Patient was very out of breath on the phone and had trouble talking on the phone. Please advise.

## 2024-04-11 NOTE — Progress Notes (Unsigned)
"       ° °  I, Leotis Batter, CMA acting as a scribe for Artist Lloyd, MD.  Nathan Rodriguez is a 79 y.o. male who presents to [facility] at [facility] today for exacerbation of his L shoulder pain. 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 Dozier.  Pt called the office on the 15th c/o severe L shoulder pain that came on randomly in the middle of the night. Tizanidine  was refilled.  Today, pt reports worsening shoulder pain s/p reverse L shoulder replacement. Has pain at anterior aspect of the shoulder radiating into the upper arm. Significant bruising present from the shoulder to the elbow. Sx causing night disturbance.   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  Currently anticoagulated with Xarelto  secondary to factor V Leiden mutation and history of a pulmonary embolism.   Exam:  BP 100/72   Ht 5' 9 (1.753 m)   Wt 171 lb (77.6 kg)   BMI 25.25 kg/m  General: Well Developed, well nourished, and in no acute distress.   MSK: Left shoulder mature scar anterior shoulder.  Swelling is present in the shoulder and significant bruising is present from the shoulder distally down the upper arm. Decreased shoulder motion.  Strength intact within limits of motion.     Lab and Radiology Results  Diagnostic Limited MSK Ultrasound of: Left shoulder Loculated hypoechoic fluid collecting anterior aspect of the glenohumeral joint.  Consistent with partially coagulated hemarthrosis. Impression: Hemarthrosis      Assessment and Plan: 79 y.o. 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  Patient developed swelling and tightness on the 15th of this month and bruising subsequently.  Symptoms are consistent with bleeding into the shoulder with hemarthrosis.  This is apparently a recurrent problem.   He is currently anticoagulated with Xarelto .  He really cannot stop his anticoagulation without having increased risk of clotting. We discussed options.  Will communicate with his cardiologist Dr. Court about potentially switching to Eliquis.  This may have some reduce risk of bleeding compared to Xarelto .  Additionally will communicate with his orthopedic surgeon Dr. Dozier about his current state.  For now topical diclofenac gel and compression.  If we can catch this really early the next time we could potentially aspirate the hemarthrosis before it coagulates.  Recheck in about 2-1/2 weeks.   PDMP not reviewed this encounter. Orders Placed This Encounter  Procedures   US  LIMITED JOINT SPACE STRUCTURES UP LEFT(NO LINKED CHARGES)    Reason for Exam (SYMPTOM  OR DIAGNOSIS REQUIRED):   left shoulder pain    Preferred imaging location?:   [facility]   No orders of the defined types were placed in this encounter.    Discussed warning signs or symptoms. Please see discharge instructions. Patient expresses understanding.   The above documentation has been reviewed and is accurate and complete Artist Lloyd, M.D.   "

## 2024-04-12 ENCOUNTER — Encounter: Payer: Self-pay | Admitting: Family Medicine

## 2024-04-12 ENCOUNTER — Other Ambulatory Visit: Payer: Self-pay

## 2024-04-12 ENCOUNTER — Ambulatory Visit: Admitting: Family Medicine

## 2024-04-12 VITALS — BP 100/72 | Ht 69.0 in | Wt 171.0 lb

## 2024-04-12 DIAGNOSIS — G8929 Other chronic pain: Secondary | ICD-10-CM

## 2024-04-12 DIAGNOSIS — M25512 Pain in left shoulder: Secondary | ICD-10-CM | POA: Diagnosis not present

## 2024-04-12 NOTE — Patient Instructions (Addendum)
 Thank you for coming in today.   I'm going to talk to Dr. Dozier and Dr. Court about switching your blood thinner  Check back in 2.5 weeks  If this re-occurs, let me know and I can try to drain the fluid.

## 2024-04-23 ENCOUNTER — Telehealth: Payer: Self-pay | Admitting: Cardiovascular Disease

## 2024-04-24 ENCOUNTER — Other Ambulatory Visit (HOSPITAL_COMMUNITY): Payer: Self-pay

## 2024-04-24 ENCOUNTER — Telehealth: Payer: Self-pay | Admitting: Pharmacy Technician

## 2024-04-24 NOTE — Telephone Encounter (Signed)
 Patient Advocate Encounter  Total amount awarded, 2500.00 .  Effective: 10/17/23 - 10/15/24   APW:[id number] ERW:[id number] Hmnle:[id number] PI:[id number]   [facility] ID: [id number]   Pharmacy provided with approval and processing information. Patient informed via call

## 2024-04-26 NOTE — Progress Notes (Unsigned)
"       ° ° °  TREVYN LUMPKIN is a 79 y.o. male who presents to [facility] at [facility] today for ***   Pertinent review of systems: ***  Relevant historical information: ***   Exam:  There were no vitals taken for this visit. General: Well Developed, well nourished, and in no acute distress.   MSK: ***    Lab and Radiology Results No results found for this or any previous visit (from the past 72 hours). No results found.     Assessment and Plan: 79 y.o. male with ***   PDMP not reviewed this encounter. No orders of the defined types were placed in this encounter.  No orders of the defined types were placed in this encounter.    Discussed warning signs or symptoms. Please see discharge instructions. Patient expresses understanding.   ***  "

## 2024-05-01 ENCOUNTER — Ambulatory Visit: Admitting: Family Medicine
